# Patient Record
Sex: Female | Born: 1960 | Race: White | Hispanic: Yes | Marital: Married | Smoking: Never smoker
Health system: Southern US, Community
[De-identification: ages and names within clinical notes are randomized; demographics above are authoritative.]

## PROBLEM LIST (undated history)

## (undated) DIAGNOSIS — T7840XA Allergy, unspecified, initial encounter: Secondary | ICD-10-CM

## (undated) DIAGNOSIS — F329 Major depressive disorder, single episode, unspecified: Secondary | ICD-10-CM

## (undated) DIAGNOSIS — Z1371 Encounter for nonprocreative screening for genetic disease carrier status: Secondary | ICD-10-CM

## (undated) DIAGNOSIS — D219 Benign neoplasm of connective and other soft tissue, unspecified: Secondary | ICD-10-CM

## (undated) DIAGNOSIS — N809 Endometriosis, unspecified: Secondary | ICD-10-CM

## (undated) DIAGNOSIS — K222 Esophageal obstruction: Secondary | ICD-10-CM

## (undated) DIAGNOSIS — K449 Diaphragmatic hernia without obstruction or gangrene: Secondary | ICD-10-CM

## (undated) DIAGNOSIS — D649 Anemia, unspecified: Secondary | ICD-10-CM

## (undated) DIAGNOSIS — M810 Age-related osteoporosis without current pathological fracture: Secondary | ICD-10-CM

## (undated) DIAGNOSIS — N3010 Interstitial cystitis (chronic) without hematuria: Secondary | ICD-10-CM

## (undated) DIAGNOSIS — J45909 Unspecified asthma, uncomplicated: Secondary | ICD-10-CM

## (undated) DIAGNOSIS — K209 Esophagitis, unspecified without bleeding: Secondary | ICD-10-CM

## (undated) DIAGNOSIS — F32A Depression, unspecified: Secondary | ICD-10-CM

## (undated) DIAGNOSIS — Z8719 Personal history of other diseases of the digestive system: Secondary | ICD-10-CM

## (undated) DIAGNOSIS — K219 Gastro-esophageal reflux disease without esophagitis: Secondary | ICD-10-CM

## (undated) DIAGNOSIS — F419 Anxiety disorder, unspecified: Secondary | ICD-10-CM

## (undated) HISTORY — DX: Diaphragmatic hernia without obstruction or gangrene: K44.9

## (undated) HISTORY — DX: Gastro-esophageal reflux disease without esophagitis: K21.9

## (undated) HISTORY — DX: Unspecified asthma, uncomplicated: J45.909

## (undated) HISTORY — DX: Benign neoplasm of connective and other soft tissue, unspecified: D21.9

## (undated) HISTORY — DX: Encounter for nonprocreative screening for genetic disease carrier status: Z13.71

## (undated) HISTORY — DX: Esophagitis, unspecified without bleeding: K20.90

## (undated) HISTORY — DX: Age-related osteoporosis without current pathological fracture: M81.0

## (undated) HISTORY — DX: Esophageal obstruction: K22.2

## (undated) HISTORY — DX: Esophagitis, unspecified: K20.9

## (undated) HISTORY — DX: Personal history of other diseases of the digestive system: Z87.19

## (undated) HISTORY — DX: Anemia, unspecified: D64.9

## (undated) HISTORY — DX: Depression, unspecified: F32.A

## (undated) HISTORY — DX: Anxiety disorder, unspecified: F41.9

## (undated) HISTORY — DX: Allergy, unspecified, initial encounter: T78.40XA

## (undated) HISTORY — DX: Interstitial cystitis (chronic) without hematuria: N30.10

## (undated) HISTORY — PX: CHOLECYSTECTOMY: SHX55

## (undated) HISTORY — DX: Major depressive disorder, single episode, unspecified: F32.9

## (undated) HISTORY — DX: Endometriosis, unspecified: N80.9

---

## 1998-10-12 ENCOUNTER — Emergency Department (HOSPITAL_COMMUNITY): Admission: EM | Admit: 1998-10-12 | Discharge: 1998-10-12 | Payer: Self-pay | Admitting: Emergency Medicine

## 1999-03-30 ENCOUNTER — Other Ambulatory Visit: Admission: RE | Admit: 1999-03-30 | Discharge: 1999-03-30 | Payer: Self-pay | Admitting: Obstetrics and Gynecology

## 2000-04-18 ENCOUNTER — Other Ambulatory Visit: Admission: RE | Admit: 2000-04-18 | Discharge: 2000-04-18 | Payer: Self-pay | Admitting: Gynecology

## 2001-04-19 ENCOUNTER — Other Ambulatory Visit: Admission: RE | Admit: 2001-04-19 | Discharge: 2001-04-19 | Payer: Self-pay | Admitting: Gynecology

## 2001-11-26 HISTORY — PX: TMJ ARTHROSCOPY: SHX1067

## 2002-01-07 ENCOUNTER — Ambulatory Visit (HOSPITAL_COMMUNITY): Admission: RE | Admit: 2002-01-07 | Discharge: 2002-01-07 | Payer: Self-pay | Admitting: Unknown Physician Specialty

## 2002-01-07 ENCOUNTER — Encounter: Payer: Self-pay | Admitting: Unknown Physician Specialty

## 2002-04-19 ENCOUNTER — Other Ambulatory Visit: Admission: RE | Admit: 2002-04-19 | Discharge: 2002-04-19 | Payer: Self-pay | Admitting: Gynecology

## 2003-06-12 ENCOUNTER — Other Ambulatory Visit: Admission: RE | Admit: 2003-06-12 | Discharge: 2003-06-12 | Payer: Self-pay | Admitting: Gynecology

## 2003-12-21 HISTORY — PX: PELVIC LAPAROSCOPY: SHX162

## 2003-12-21 HISTORY — PX: HYSTEROSCOPY: SHX211

## 2003-12-22 ENCOUNTER — Ambulatory Visit (HOSPITAL_COMMUNITY): Admission: RE | Admit: 2003-12-22 | Discharge: 2003-12-22 | Payer: Self-pay | Admitting: Gynecology

## 2003-12-22 ENCOUNTER — Ambulatory Visit (HOSPITAL_BASED_OUTPATIENT_CLINIC_OR_DEPARTMENT_OTHER): Admission: RE | Admit: 2003-12-22 | Discharge: 2003-12-22 | Payer: Self-pay | Admitting: Gynecology

## 2003-12-23 ENCOUNTER — Encounter (INDEPENDENT_AMBULATORY_CARE_PROVIDER_SITE_OTHER): Payer: Self-pay | Admitting: Specialist

## 2004-06-17 ENCOUNTER — Other Ambulatory Visit: Admission: RE | Admit: 2004-06-17 | Discharge: 2004-06-17 | Payer: Self-pay | Admitting: Gynecology

## 2004-07-22 HISTORY — PX: VAGINAL HYSTERECTOMY: SUR661

## 2004-08-05 ENCOUNTER — Observation Stay (HOSPITAL_COMMUNITY): Admission: RE | Admit: 2004-08-05 | Discharge: 2004-08-06 | Payer: Self-pay | Admitting: Gynecology

## 2005-06-21 ENCOUNTER — Other Ambulatory Visit: Admission: RE | Admit: 2005-06-21 | Discharge: 2005-06-21 | Payer: Self-pay | Admitting: Gynecology

## 2006-06-28 ENCOUNTER — Other Ambulatory Visit: Admission: RE | Admit: 2006-06-28 | Discharge: 2006-06-28 | Payer: Self-pay | Admitting: Gynecology

## 2006-08-24 ENCOUNTER — Ambulatory Visit: Payer: Self-pay | Admitting: Internal Medicine

## 2007-07-02 ENCOUNTER — Other Ambulatory Visit: Admission: RE | Admit: 2007-07-02 | Discharge: 2007-07-02 | Payer: Self-pay | Admitting: Gynecology

## 2007-10-29 ENCOUNTER — Encounter: Payer: Self-pay | Admitting: Internal Medicine

## 2007-10-30 ENCOUNTER — Encounter: Payer: Self-pay | Admitting: Internal Medicine

## 2008-01-18 DIAGNOSIS — F330 Major depressive disorder, recurrent, mild: Secondary | ICD-10-CM | POA: Insufficient documentation

## 2008-01-18 DIAGNOSIS — J310 Chronic rhinitis: Secondary | ICD-10-CM

## 2008-01-18 DIAGNOSIS — F339 Major depressive disorder, recurrent, unspecified: Secondary | ICD-10-CM | POA: Insufficient documentation

## 2008-01-18 DIAGNOSIS — F325 Major depressive disorder, single episode, in full remission: Secondary | ICD-10-CM | POA: Insufficient documentation

## 2008-01-21 ENCOUNTER — Ambulatory Visit: Payer: Self-pay | Admitting: Internal Medicine

## 2008-01-21 DIAGNOSIS — T887XXA Unspecified adverse effect of drug or medicament, initial encounter: Secondary | ICD-10-CM | POA: Insufficient documentation

## 2008-02-08 ENCOUNTER — Ambulatory Visit: Payer: Self-pay | Admitting: Internal Medicine

## 2008-04-25 ENCOUNTER — Encounter: Payer: Self-pay | Admitting: Internal Medicine

## 2008-05-19 ENCOUNTER — Encounter: Payer: Self-pay | Admitting: Internal Medicine

## 2008-07-03 ENCOUNTER — Encounter: Payer: Self-pay | Admitting: Gynecology

## 2008-07-03 ENCOUNTER — Other Ambulatory Visit: Admission: RE | Admit: 2008-07-03 | Discharge: 2008-07-03 | Payer: Self-pay | Admitting: Gynecology

## 2008-07-03 ENCOUNTER — Ambulatory Visit: Payer: Self-pay | Admitting: Gynecology

## 2008-08-01 ENCOUNTER — Ambulatory Visit: Payer: Self-pay | Admitting: Internal Medicine

## 2008-08-01 DIAGNOSIS — K589 Irritable bowel syndrome without diarrhea: Secondary | ICD-10-CM | POA: Insufficient documentation

## 2008-10-24 ENCOUNTER — Telehealth: Payer: Self-pay | Admitting: Internal Medicine

## 2008-10-24 ENCOUNTER — Emergency Department (HOSPITAL_COMMUNITY): Admission: EM | Admit: 2008-10-24 | Discharge: 2008-10-24 | Payer: Self-pay | Admitting: Emergency Medicine

## 2008-10-27 ENCOUNTER — Ambulatory Visit: Payer: Self-pay | Admitting: Internal Medicine

## 2008-10-27 DIAGNOSIS — R079 Chest pain, unspecified: Secondary | ICD-10-CM | POA: Insufficient documentation

## 2008-10-27 DIAGNOSIS — R5381 Other malaise: Secondary | ICD-10-CM | POA: Insufficient documentation

## 2008-10-27 DIAGNOSIS — R5383 Other fatigue: Secondary | ICD-10-CM

## 2008-10-28 LAB — CONVERTED CEMR LAB
Basophils Absolute: 0 10*3/uL (ref 0.0–0.1)
Basophils Relative: 0.9 % (ref 0.0–3.0)
Eosinophils Absolute: 0.4 10*3/uL (ref 0.0–0.7)
Eosinophils Relative: 10.2 % — ABNORMAL HIGH (ref 0.0–5.0)
HCT: 31.2 % — ABNORMAL LOW (ref 36.0–46.0)
Hemoglobin: 10.8 g/dL — ABNORMAL LOW (ref 12.0–15.0)
Lymphocytes Relative: 21.8 % (ref 12.0–46.0)
MCHC: 34.7 g/dL (ref 30.0–36.0)
MCV: 85.5 fL (ref 78.0–100.0)
Monocytes Absolute: 0.4 10*3/uL (ref 0.1–1.0)
Monocytes Relative: 8.9 % (ref 3.0–12.0)
Neutro Abs: 2.5 10*3/uL (ref 1.4–7.7)
Neutrophils Relative %: 58.2 % (ref 43.0–77.0)
Platelets: 190 10*3/uL (ref 150–400)
RBC: 3.65 M/uL — ABNORMAL LOW (ref 3.87–5.11)
RDW: 11.4 % — ABNORMAL LOW (ref 11.5–14.6)
WBC: 4.2 10*3/uL — ABNORMAL LOW (ref 4.5–10.5)

## 2009-07-06 ENCOUNTER — Other Ambulatory Visit: Admission: RE | Admit: 2009-07-06 | Discharge: 2009-07-06 | Payer: Self-pay | Admitting: Gynecology

## 2009-07-06 ENCOUNTER — Encounter: Payer: Self-pay | Admitting: Gynecology

## 2009-07-06 ENCOUNTER — Ambulatory Visit: Payer: Self-pay | Admitting: Gynecology

## 2009-10-21 ENCOUNTER — Ambulatory Visit: Payer: Self-pay | Admitting: Internal Medicine

## 2010-06-16 ENCOUNTER — Ambulatory Visit: Payer: Self-pay | Admitting: Gynecology

## 2010-06-17 ENCOUNTER — Ambulatory Visit: Payer: Self-pay | Admitting: Gynecology

## 2010-07-07 ENCOUNTER — Ambulatory Visit: Payer: Self-pay | Admitting: Gynecology

## 2010-07-07 ENCOUNTER — Other Ambulatory Visit: Admission: RE | Admit: 2010-07-07 | Discharge: 2010-07-07 | Payer: Self-pay | Admitting: Gynecology

## 2010-08-09 ENCOUNTER — Ambulatory Visit: Payer: Self-pay | Admitting: Gynecology

## 2010-09-23 NOTE — Assessment & Plan Note (Signed)
Summary: Primary svc/ acute rhinitis flare on benadryl/ conjunctivitis   Primary Provider/Referring Provider:  Sherene Sires  CC:  Acute visit.  Pt c/o red itchy eyes x 2 days.  She states that she woke up several times in the night with her eyes matted shut.  .  History of Present Illness: 38 yowf  never smoker with a history of chronic rhinitis and depression with various musculoskeletal complaints that typically responded in the past too Vioxx or other nonsteroidals.  Seen 01/21/08 with  right elbow pain 6 weeks ago while moving furniture.  She was seen in urgent care and tried on Mobic which improved the pain in her elbow only a little bit and then she developed an itchy rash over both arms.  She describes the pain as radiating from the elbow down to the ulnar distribution of the hand with mild numbness no loss of strength and denies any neck pain or injury. Given prednisone taper last visit. Elbow feels much better. Rash gradually resolved.   October 27, 2008 ov after giving blood at high school 3/5  about 930 am 15 min later while sitting at cracker and juice table she began feeling nauseated and urge for bm, stood up then fell and hit floor in the hallway and next remembered looking up at the ceiling > to er at [facility]  left at 730 pm with dx of head lac and  vasovagal syncope with neg head and neck ct,  everything's better at ov except puffiness under eyes.  No am ha or nausea viz or other neuro co's.  October 21, 2009  acute visit.  Pt c/o red itchy eyes x 2 days.  She states that she woke up several times in the night with her eyes matted shut.  self rx with benadryl helps sleep. can't take sudaphed. no purulent nasal secretions.  Pt denies any significant sore throat, dysphagia, itching, sneezing,   fever, chills, sweats, unintended wt loss, pleuritic or exertional cp, hempoptysis, change in activity tolerance  orthopnea pnd or leg swelling. No viz problems or sign ha.   Current Medications (verified): 1)   Wellbutrin Sr 150 Mg  Tb12 (Bupropion Hcl) .... Take 1 Tablet By Mouth Two Times A Day 2)  Nasonex 50 Mcg/act Susp (Mometasone Furoate) .Marland Kitchen.. 1 Spray Each Nostril Every Am  Allergies (verified): No Known Drug Allergies  Past History:  Past Medical History: RHINITIS, CHRONIC (ICD-472.0) DEPRESSION (ICD-311)    Vital Signs:  Patient profile:   50 year old female Weight:      137 pounds BMI:     25.15 O2 Sat:      99 % on Room air Temp:     97.7 degrees F oral Pulse rate:   72 / minute BP sitting:   118 / 60  (left arm)  Vitals Entered By: Vernie Murders (October 21, 2009 9:55 AM)  O2 Flow:  Room air  Physical Exam  Additional Exam:  in general she is a somber but not overtly depressed ambulatory white female nad  wt 134 August 01, 2008 > 137  09/29/08 > 137 October 21, 2009  HEENT: `nl dentition, turbinates, and orophanx. Nl external ear canals without cough reflex, minimal puffiness under eyes, minimal conjuctival erythema.   Neck without JVD/Nodes/TM/ FROM Lungs clear to A and P bilaterally without cough on insp or exp maneuvers RRR no s3 or murmur or increase in P2 Abd soft and benign with nl excursion in the supine position.  No bruits or organomegaly Ext warm without calf tenderness, cyanosis clubbing or edema     Impression & Recommendations:  Problem # 1:  RHINITIS, CHRONIC (ICD-472.0)  acute flare with ? conjunctivitis.   Each maintenance medication was reviewed in detail including most importantly the difference between maintenance and as needed and under what circumstances the prns are to be used. See instructions for specific recommendations - should avoid benadryl in this setting.  Orders: Est. Patient Level IV (45409)  Medications Added to Medication List This Visit: 1)  Nasonex 50 Mcg/act Susp (Mometasone furoate) .Marland Kitchen.. 1 spray each nostril every am 2)  Prednisone 10 Mg Tabs (Prednisone) .... 4 each am x 2days, 2x2days, 1x2days and stop 3)  Cortisporin  3.5-10000-1 Soln (Neomycin-polymyxin-hc) .... Optic solution 2 gtts two times a day both eyes  Patient Instructions: 1)  I emphasized that nasal steroids (Nasacort)  have no immediate benefit in terms of improving symptoms.  To help them reached the target tissue, the patient should use Afrin two puffs every 12 hours applied one min before using the nasal steroids.  Afrin should be stopped after no more than 5 days.  If the symptoms worsen, Afrin can be restarted after 5 days off of therapy to prevent rebound congestion from overuse of Afrin.  I also emphasized that in no way are nasal steroids a concern in terms of "addiction". 2)  corticosporin optic solution per bottle 3)  change benadryl to allergra or clariton 4)  Prednisone 4 each am x 2days, 2x2days, 1x2days and stop  5)  call if not better in 48 hours 6)    Prescriptions: CORTISPORIN 3.5-10000-1 SOLN (NEOMYCIN-POLYMYXIN-HC) Optic solution 2 gtts two times a day both eyes  #1 bottle x 0   Entered and Authorized by:   Nyoka Cowden MD   Signed by:   Nyoka Cowden MD on 10/21/2009   Method used:   Electronically to        [facility]  [address] [id number]* (retail)       [address]       [place], [place]  [address]       Ph: [phone] or [phone]       Fax: [phone]   RxID:   [id number] PREDNISONE 10 MG  TABS (PREDNISONE) 4 each am x 2days, 2x2days, 1x2days and stop  #14 x 0   Entered and Authorized by:   Nyoka Cowden MD   Signed by:   Nyoka Cowden MD on 10/21/2009   Method used:   Electronically to        [facility]  [address] [id number]* (retail)       [address]       [place], [place]  [address]       Ph: [phone] or [phone]       Fax: [phone]   RxID:   [id number] NASONEX 50 MCG/ACT SUSP (MOMETASONE FUROATE) 1 spray each nostril every am  #1 x 11   Entered and Authorized by:   Nyoka Cowden MD   Signed by:   Nyoka Cowden MD on 10/21/2009   Method  used:   Electronically to        [facility]  [address] [id number]* (retail)       [address]       [facility]  [place], [place]  [address]       Ph: [phone] or [phone]       Fax: [phone]   RxID:   [id number]

## 2010-12-02 LAB — URINALYSIS, ROUTINE W REFLEX MICROSCOPIC
Nitrite: NEGATIVE
Protein, ur: NEGATIVE mg/dL
Specific Gravity, Urine: 1.014 (ref 1.005–1.030)
Urobilinogen, UA: 0.2 mg/dL (ref 0.0–1.0)

## 2010-12-02 LAB — COMPREHENSIVE METABOLIC PANEL
ALT: 11 U/L (ref 0–35)
AST: 21 U/L (ref 0–37)
Albumin: 3.7 g/dL (ref 3.5–5.2)
Alkaline Phosphatase: 87 U/L (ref 39–117)
Chloride: 108 mEq/L (ref 96–112)
Creatinine, Ser: 0.77 mg/dL (ref 0.4–1.2)
GFR calc Af Amer: >60 mL/min (ref >60)
Potassium: 4.1 mEq/L (ref 3.5–5.1)
Sodium: 137 mEq/L (ref 135–145)
Total Bilirubin: 0.8 mg/dL (ref 0.3–1.2)

## 2010-12-02 LAB — DIFFERENTIAL
Basophils Absolute: 0 10*3/uL (ref 0.0–0.1)
Eosinophils Absolute: 0.1 10*3/uL (ref 0.0–0.7)
Eosinophils Relative: 2 % (ref 0–5)
Lymphocytes Relative: 8 % — ABNORMAL LOW (ref 12–46)
Monocytes Absolute: 0.4 10*3/uL (ref 0.1–1.0)

## 2010-12-02 LAB — CBC
Platelets: 208 10*3/uL (ref 150–400)
RBC: 4.19 MIL/uL (ref 3.87–5.11)
WBC: 7.9 10*3/uL (ref 4.0–10.5)

## 2011-01-04 NOTE — Consult Note (Signed)
Yolanda [name], Yolanda [name]                 ACCOUNT NO.:  [id number]   MEDICAL RECORD NO.:  [id number]          PATIENT TYPE:  EMS   LOCATION:  ED                           FACILITY:  [facility]   PHYSICIAN:  Newman Pies, MD            DATE OF BIRTH:  [birth year]   DATE OF CONSULTATION:  10/24/2008  DATE OF DISCHARGE:                                 CONSULTATION   CHIEF COMPLAINT:  Complex forehead laceration.   HISTORY OF PRESENT ILLNESS:  The patient is a 50 year old white female  who presents to the [facility] Emergency Room on October 24, 2008, after  she fell and hit her forehead on the ground.  The accident occurred  after a brief syncope episode.  According to the husband, the event  occurred after the patient donated blood at a high school blood drive.  The fall resulted in a stellate forehead laceration.  The patient  reports a brief loss of consciousness.  She denies any visual change,  epistaxis, or memory loss.  The patient has no previous history of  syncope.   PAST MEDICAL HISTORY:  Depression.   PAST SURGICAL HISTORY:  TMJ surgery.   HOME MEDICATION:  Wellbutrin.   ALLERGIES:  No known drug allergies.   SOCIAL HISTORY:  Nonsmoker, nondrinker, no drug abuse.   IMAGING STUDY:  The CT of the head is negative for any intracranial  injury.  Only a soft tissue injury over the forehead is noted.  No  facial fracture is noted.   PHYSICAL EXAMINATION:  VITAL SIGNS:  Temperature 98.4, blood pressure  111/73, pulse 81, respiration 18, and 100% oxygen saturation on room  air.  GENERAL:  The patient is a well-nourished, well-developed 50 year old  white female in no acute distress.  She is alert and oriented x3.  HEENT:  Her pupils are equal, round and reactive to light.  Extraocular  motion is intact.  Examination of the face shows a stellate forehead  laceration.  The laceration is full thickness down to the periosteum.  The laceration measures approximately 6 cm in size.  Examination of  the  ears shows normal ear canals, tympanic membranes, and middle ear spaces.  Nasal examination shows normal mucosa, septum, and turbinates.  No  epistaxis is noted.  Oral cavity examination shows normal lips, gums,  tongue, oral cavity, oropharyngeal mucosa.  NECK:  Palpation of the neck reveals no lymphadenopathy or mass.  Her  trachea is midline.  The thyroid is not significantly enlarged.  NEUROLOGIC:  Cranial nerves II-XII are all grossly intact.   PROCEDURE:  Laceration repair of the complex forehead laceration.   ANESTHESIA:  Local anesthesia with 1% lidocaine with 1:100,000  epinephrine.   DESCRIPTION OF PROCEDURE:  The patient is placed supine on the hospital  bed.  The forehead laceration site is cleaned and copiously irrigated.  It is prepped and draped in a standard fashion with iodine.  The 1%  lidocaine with 1:100,000 epinephrine is infiltrated locally for local  anesthesia.  After adequate anesthesia is achieved, the wound site  is  carefully explored.  All the devitalized tissues are removed.  The  stellate laceration is subsequently closed in layers with 4-0 Vicryl and  5-0 Prolene sutures.  The patient tolerated the procedure well without  difficulty.   IMPRESSION:  Complex forehead laceration measuring 6 cm in size.   RECOMMENDATIONS:  1. Forehead laceration repair in the emergency room.  2. Vicodin 1-2 tablets p.o. q.4-6 h. p.r.n. pain.  3. Flexeril 10 mg p.o. at bedtime.  4. Augmentin 875 mg p.o. b.i.d. for 5 days.  5. The patient will follow up in my office in 1 week for suture      removal.      Newman Pies, MD  Electronically Signed     ST/MEDQ  D:  10/24/2008  T:  10/25/2008  Job:  [id number]

## 2011-01-07 NOTE — Assessment & Plan Note (Signed)
[facility]                             PULMONARY OFFICE NOTE   Yolanda, [name]                          MRN:          [id number]  DATE:08/24/2006                            DOB:          April 01, 1961    HISTORY OF PRESENT ILLNESS:  The patient is a 50 year old white female  patient of Dr. Thurston Hole who has a known history of depression and chronic  rhinitis, presents for an acute office visit. The patient complained of  a 2-day history of bilateral lower back pain. The patient has not been  seen in greater than 4 years here at the office, reporting the she has  been doing well up until this last week. The patient complains that  yesterday she was bending over to pick up a laundry basket and felt a  pulling pain in her lower back. Since then she has been having  significant difficultly bending, trying to sit up from a standing  position, and significant pain along the bilateral lower back. She  denies any known previous injuries, surgery, or back problems. She did  have a fall approximately 2 weeks ago in which she tripped over a  curbing outside and fell onto her knees but did not have any back pain  at that time. The patient denies any chest pain, shortness of breath,  presyncopal, syncopal episodes, palpitations, urinary symptoms,  incontinence or radicular pain. The patient states her gait has been  normal and no extremity weakness either. She has used some Motrin 800 mg  over-the-counter today and that seems to have helped some.   PAST MEDICAL HISTORY:  Reviewed.   CURRENT MEDICATIONS:  Reviewed.   PHYSICAL EXAMINATION:  The patient is a pleasant female, in no acute  distress. She is afebrile. Stable vital signs. O2 saturation is 99% on  room air.  HEENT: Unremarkable.  NECK: Supple, without adenopathy  LUNG SOUNDS: Clear.  CARDIAC: Regular rate.  ABDOMEN: Soft without hepatosplenomegaly, no guarding, or rebound.  No  abdominal bruits or  masses appreciated.  Negative CVA tenderness.  EXTREMITIES: Warm without any calf tenderness, cyanosis, clubbing or  edema.  Equal strength of the upper and lower extremities. Moves all  extremities well. Negative straight leg raises. DTRs are 2+ throughout.  Great toe dorsal flexion is normal. Normal and steady gait. The patient  does have reproducible symptoms with tying to stand up from a sitting  position and from changing from a lying to sitting position. The patient  does have significant tenderness along the lumbosacral region  bilaterally left greater than right.   IMPRESSION AND PLAN:  Lumbosacral strain. Patient is to benign Motrin  800 mg t.i.d. x7 days. The patient is to take this with food. Use  Skelaxin 800 mg up to 3 times a day as needed.  Vicodin # 20 on to 2 every 4 to 6 hours as needed for severe pain.  The  patient is aware to alternate ice and heat. The patient is to return  here in 2 weeks or sooner if needed.      Yolanda  Parrett, NP  Electronically Signed      Yolanda [name]. Yolanda Sires, MD, [facility]  Electronically Signed   TP/MedQ  DD: 08/25/2006  DT: 08/25/2006  Job #: [id number]

## 2011-01-07 NOTE — H&P (Signed)
Yolanda [name], TEAGUE                             ACCOUNT NO.:  [id number]   MEDICAL RECORD NO.:  [id number]                   PATIENT TYPE:  AMB   LOCATION:  [facility]                                 FACILITY:  [facility]   PHYSICIAN:  Timothy P. Fontaine, M.D.           DATE OF BIRTH:  1960/12/08   DATE OF ADMISSION:  12/22/2003  DATE OF DISCHARGE:                                HISTORY & PHYSICAL   CHIEF COMPLAINT:  Persistent left-sided pain, menorrhagia, dysmenorrhea.   HISTORY OF PRESENT ILLNESS:  Patient is a 50 year old G2, P2 female,  vasectomy birth control, who presents with increasing dysmenorrhea as well  as menorrhagia.  She also has left-sided pelvic pain that comes and goes,  which seems to be increasing in intensity.  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  Right  and left ovaries were visualized and normal.  She subsequently had a  sonohistogram, which shows involvement of the endometrial cavity by the  myoma, approximately 25% or so protruding into the cavity posteriorly.  Patient is admitted at this time for laser video laparoscopy for her pelvic  pain and attempted hysteroscopic resection of the myoma due to her  dysmenorrhea and menorrhagia.   PAST MEDICAL HISTORY:  Significant for depression.   PAST SURGICAL HISTORY:  Includes TMJ surgery x2.   CURRENT MEDICATIONS:  Effexor day and Klonopin p.r.n., Naproxen p.r.n.   ALLERGIES:  No medications.   REVIEW OF SYSTEMS:  Noncontributory.   FAMILY HISTORY:  Noncontributory.   SOCIAL HISTORY:  Noncontributory.   PHYSICAL EXAMINATION:  VITAL SIGNS:  Afebrile, vital signs are stable.  HEENT:  Normal.  LUNGS:  Clear.  CARDIAC:  Regular rate without rubs, murmurs or gallops.  ABDOMEN:  Benign.  PELVIC:  External, BUS, vagina normal.  Cervix normal.  Uterus anteverted,  normal size,  nontender.  Adnexa without masses or tenderness.   ASSESSMENT:  A 50 year old G2, P2 female, vasectomy birth control, with  increasing left-sided pain, menorrhagia and dysmenorrhea for laparoscopy,  hysteroscopy, and attempted resection of myoma.  The risks, benefits,  indications, and alternatives for the procedure were reviewed with the  patient.  The expected intraoperative and postoperative courses were  discussed with her.  I reviewed with her there are no guarantees as far as  pain relief, that her pain may persist, worsen or change following the  procedure, and she understands and accepts this.  I also reviewed the myoma  and only a relatively small portion of the myoma is protruding into the  cavity posteriorly that we will attempt to initially resect this portion, if  more of the myoma bulges into the cavity, we will resect as much as  possible.  She understands that I may be able to remove all of it but I will  not  be able to chase the myoma into the surrounding myometrium and that I  can only resect it up to the level of the surrounding endometrium and that I  may leave a substantial portion of the myoma and she understands and accepts  this.  I reviewed the risks of hysteroscopic resection and what is involved  with this to include the instrumentation and the use of the resectoscope.  The risks of bleeding, transfusion, infection, uterine perforation, damage  to internal organs necessitating major reparative surgeries as outlined  below in the laparoscopic discussion was all discussed, understood and  accepted.  The risks of sorbitol absorption and the inherent risks in  electrolyte abnormalities was also discussed with her.  I reviewed the  laparoscopic portion to include insufflation, instrumentation, trocar  placement, sharp blunt dissection, electrocautery, and the use of laser.  The risks of infection both internal requiring prolonged antibiotics as well  as incisional  requiring opening and draining of incisions was all reviewed,  understood and accepted.  The risks of major hemorrhage necessitating  transfusion and the risks of transfusion was discussed with her to include  transfusion reaction, hepatitis, HIV, mad cow disease, and other unknown  entities.  The risks of inadvertent injury to internal organs including  bowel, bladder, ureters, vessels, and nerves necessitating major exploratory  reparative surgeries and future reparative surgeries including ostomy  formation was all reviewed, understood and accepted.  Again, no guarantees  for pain relief were made.  She understands there may be no pathology found  or we may find pathology such as endometriosis or adhesive disease and I  will try to eradicate as best I can disease as encountered, and the patient  understands and accepts this.  The patient's questions are answered to her  satisfaction and she is ready to proceed with surgery.                                               Timothy P. Audie Box, M.D.    TPF/MEDQ  D:  12/19/2003  T:  12/19/2003  Job:  [id number]

## 2011-01-07 NOTE — H&P (Signed)
NAMEGRACLYN, [name]                 ACCOUNT NO.:  [id number]   MEDICAL RECORD NO.:  [id number]          PATIENT TYPE:  OBV   LOCATION:  NA                            FACILITY:  [facility]   PHYSICIAN:  Timothy P. Fontaine, M.D.DATE OF BIRTH:  Nov 25, 1960   DATE OF ADMISSION:  DATE OF DISCHARGE:                                HISTORY & PHYSICAL   DATE OF ADMISSION:  For surgery August 05, 2004 at 7:30 a.m. at [facility].   CHIEF COMPLAINT:  Menorrhagia, dysmenorrhea.   HISTORY OF PRESENT ILLNESS:  A 50 year old G2 P2 female, vasectomy birth  control, with increasing menorrhagia, dysmenorrhea.  The patient is status  post laparoscopy, hysteroscopy, with submucous leiomyomata resection May  2005 and continues to have heavy menses and is ready to proceed with  hysterectomy.   PAST MEDICAL HISTORY:  Depression.   PAST SURGICAL HISTORY:  Includes TMJ surgery x2; laparoscopy, hysteroscopy  in 2005.   MEDICATIONS:  1.  Effexor daily.  2.  Klonopin p.r.n.  3.  Naproxen p.r.n.   ALLERGIES:  No medications.   REVIEW OF SYSTEMS:  Noncontributory.   FAMILY HISTORY:  Noncontributory.   SOCIAL HISTORY:  Noncontributory.   ADMISSION PHYSICAL EXAMINATION:  VITAL SIGNS:  Afebrile, vital signs are  stable.  HEENT:  Normal.  LUNGS:  Clear.  CARDIAC:  Regular rate without rubs, murmurs, or gallops.  ABDOMEN:  Benign.  PELVIC:  External, BUN, vagina normal.  Cervix normal.  Uterus grossly  normal in size, mildly irregular.  Adnexa without masses or tenderness.   ASSESSMENT:  A 50 year old G2 P2 female, vasectomy birth control, with  increasing menorrhagia and dysmenorrhea.  She is status post laparoscopy  with findings of pelvic congestion and subserosal leiomyomata.  She is also  status post resection of submucous myoma without benefit from her  menorrhagia.  The risks, benefits, indications, and alternatives were  reviewed with her and she wants to proceed with hysterectomy.  I  reviewed  with her and her husband the expected intraoperative/postoperative courses;  the risks, benefits, indications, and alternatives.  The patient understands  that we will attempt a vaginal approach but if at any time during the  surgery it becomes unwise to proceed vaginally or if complications arise  that we may switch this to an abdominal approach and perform an abdominal  hysterectomy.  The risks of infection - both internal requiring prolonged  antibiotics as well as abscess formation requiring reoperation and drainage  of abscess was all discussed, understood, and accepted.  If indeed abdominal  incisions are made she understands the risks of incisional complications  requiring opening and draining of incisions and closure by secondary  intention.  The risk of bleeding was reviewed and the risk of hemorrhage  necessitating transfusion, and the risks of transfusion were discussed to  include transfusion reaction, hepatitis, HIV, mad cow disease, and other  unknown entities.  The risk of inadvertant injury to internal organs  including bowel, bladder, ureters, vessels, and nerves necessitating major  exploratory reparative surgeries and future reparative surgeries including  ostomy formation was  all discussed, understood, and accepted.  Absolute  sterility following the procedure was discussed, understood, and accepted.  Sexuality following hysterectomy was also reviewed and the potential for  orgasmic dysfunction, as well as persistent dyspareunia was discussed,  understood, and accepted.  The patient is having left-side pain although her  ovary was normal on laparoscopic evaluation.  She asked if I would take  close look at this side.  If there is any pathology at all she would prefer  to remove the left ovary.  She does give me permission to remove one or both  ovaries if at the time of surgery significant disease is encountered, but  she does prefer to keep both ovaries if  everything appears normal and she  understands the risks of persistent pain following the procedure.  The  issues of oophorectomy were reviewed and the risks of keeping both ovaries  to include persistent pain or pathology such as cyst formation in the future  requiring further evaluation and reoperation were reviewed, as well as the  risk of ovarian cancer in the future.  She understands and accepts this,  versus the risk of removing both ovaries and the issues of symptomatology  from hypoestrogenism, as well as the risk of HRT was discussed with her.  Again, she prefers to keep both ovaries, accepting these risks, but does  give me permission to remove one or both if disease is encountered.  The  patient's questions are answered to her satisfaction and she is ready to  proceed with surgery.     Timo   TPF/MEDQ  D:  07/28/2004  T:  07/28/2004  Job:  [id number]

## 2011-01-07 NOTE — Op Note (Signed)
NAMECARESSA, SCEARCE                             ACCOUNT NO.:  [id number]   MEDICAL RECORD NO.:  [id number]                   PATIENT TYPE:  AMB   LOCATION:  [facility]                                 FACILITY:  [facility]   PHYSICIAN:  Timothy P. Fontaine, M.D.           DATE OF BIRTH:  04/04/1961   DATE OF PROCEDURE:  DATE OF DISCHARGE:                                 OPERATIVE REPORT   PREOPERATIVE DIAGNOSES:  1. Menorrhagia, dysmenorrhea.  2. Leiomyoma, submucous.  3. Left lower quadrant pain.   POSTOPERATIVE DIAGNOSES:  1. Menorrhagia, dysmenorrhea.  2. Leiomyoma, submucous.  3. Left lower quadrant pain.  4. Pelvic congestion.   OPERATION/PROCEDURE:  1. Laparoscopic submucous myoma resection.  2. Diagnostic laparoscopy.   SURGEON:  Timothy P. Fontaine, M.D.   ANESTHESIA:  General endotracheal anesthesia.   COMPLICATIONS:  None.   SORBITOL DISCREPANCY:  50 mL.   ESTIMATED BLOOD LOSS:  Minimal.   SPECIMENS:  1. Myoma fragments.  2. EMC.   FINDINGS:  1. Hysteroscopic submucous myoma, right mid posterior cavity, resected 90%     with small portion remaining within the myometrium.  Hysteroscopic     otherwise normal noting right and left tubal ostia, fundus, anterior and     posterior uterine surfaces, lower uterine segment, endocervical canal all     visualized.  Laparoscopic anterior cul-de-sac normal.  Posterior cul-de-sac grossly  normal noting significant venous pelvic congestion with bilateral  parauterine vessels engorged.  Uterus overall grossly normal noting small 5  mm pedunculated for serosal myoma, anterior left fundus, larger 1.5-2 cm  subserosal myoma, posterior left mid uterine wall.  Uterus otherwise grossly  normal.  Right and left fallopian tubes normal length, caliber, with  fimbriated ends.  Right and left ovaries grossly normal.  No evidence of  pelvic endometriosis for adhesive disease.  Upper abdominal exam shows  appendix grossly normal, free and  mobile.  Liver smooth, no abnormalities.  Gallbladder grossly normal.  No evidence of upper abdominal disease or  adhesions.   DESCRIPTION OF PROCEDURE:  The patient was taken to the operating room.  Underwent general endotracheal anesthesia.  Was placed in the low dorsal  lithotomy position. Received an abdominal, perineal and vaginal preparation  with Betadine solution.  Bladder emptied with a Foley catheterization and  EUA performed.  The patient was draped in the usual fashion.  The cervix was  then visualized, grasped with a single-tooth tenaculum, gently gradually  dilated to admit the operative hysteroscope.  Hysteroscopy was performed  with findings of a submucous myoma, approximately 50% within the cavity from  the right mid posterior uterine surface.  Using the right angle  resectoscopic loop, the myoma was progressively resected in multiple passes  allowing decompression of the cavity with removal of fragments and more of  the myoma protruded into the cavity.  At the end of the procedure, there was  approximately 10% of  the myoma which remained within the myometrial wall and  it was felt unsafe to further resect this as this would be chasing the myoma  into the myometrium.  A gentle sharp curettage was then performed.  Rehysteroscopy showed an empty cavity, good distention with no myoma  protruding into the cavity.  There was no evidence of perforation throughout  the case.   Attention was then turned to the laparoscopic portion.  A Hulka tenaculum  was placed on the cervix.  The surgeon and assistant regloved.  The vertical  infraumbilical incision was made, the Veress needle placed, its position  verified with water, and approximately 3 L of carbon dioxide gas infused  without difficulty.  The 10 mm laparoscopic trocar was then placed without  difficulty, its position verified visually.  A 5 mm suprapubic port was then  placed under direct visualization after  transillumination for the vessels  without difficulty and examination of the pelvic organs and upper abdominal  exam was carried out with findings noted above.  The suprapubic port was  then removed, the gas allowed to escape, the port reinspected under a low  pressure situation showing adequate hemostasis.  The infraumbilical port was  then backed out under direct visualization showing adequate hemostasis.  No  evidence of hernia formation.  A 0 Vicryl interrupted subcuticular fascial  stitch was placed infraumbilically.  Both skin incisions were injected using  0.25% Marcaine.  Dermabond skin adhesive applied for closure.  The Hulka  tenaculum removed.  The patient was placed in the supine position, awakened  without difficulty and taken to the recovery room in good condition, having  tolerated the procedure well                                               Timothy P. Audie Box, M.D.    TPF/MEDQ  D:  12/22/2003  T:  12/22/2003  Job:  [id number]

## 2011-01-07 NOTE — Discharge Summary (Signed)
NAMELIYAT, FAULKENBERRY                 ACCOUNT NO.:  [id number]   MEDICAL RECORD NO.:  [id number]          PATIENT TYPE:  OBV   LOCATION:  9316                          FACILITY:  [facility]   PHYSICIAN:  Timothy P. Fontaine, M.D.DATE OF BIRTH:  [birth year]   DATE OF ADMISSION:  08/05/2004  DATE OF DISCHARGE:                                 DISCHARGE SUMMARY   DISCHARGE DIAGNOSES:  1.  Menorrhagia.  2.  Dysmenorrhea.  3.  Leiomyomata.   PROCEDURE:  Total vaginal hysterectomy August 05, 2004.  Pathology  pending.   HOSPITAL COURSE:  A 50 year old female with increasing dysmenorrhea and  menorrhagia underwent uncomplicated total vaginal hysterectomy August 05, 2004.  Her postoperative course was uncomplicated and she was discharged the  morning following the surgery ambulating well, tolerating oral diet, with a  postoperative hemoglobin of 10.5.  The patient received precautions,  instructions, and follow-up, will be seen in the office in 2 weeks, and  received a prescription for Tylox #25 one to two p.o. q.4-6h. p.r.n. pain.     Timo   TPF/MEDQ  D:  08/06/2004  T:  08/06/2004  Job:  [id number]

## 2011-01-07 NOTE — Op Note (Signed)
Yolanda [name], Yolanda [name]                 ACCOUNT NO.:  [id number]   MEDICAL RECORD NO.:  [id number]          PATIENT TYPE:  OBV   LOCATION:  9399                          FACILITY:  [facility]   PHYSICIAN:  Timothy P. Fontaine, M.D.DATE OF BIRTH:  12/15/1960   DATE OF PROCEDURE:  08/05/2004  DATE OF DISCHARGE:                                 OPERATIVE REPORT   PREOPERATIVE DIAGNOSES:  1.  Dysmenorrhea.  2.  Menorrhagia.   POSTOPERATIVE DIAGNOSES:  1.  Dysmenorrhea.  2.  Menorrhagia.  3.  Leiomyomata.   PROCEDURE:  Total vaginal hysterectomy.   SURGEON:  Timothy P. Fontaine, M.D.   ASSISTANT:  Rande Brunt. Eda Paschal, M.D.   ANESTHESIA:  General.   ESTIMATED BLOOD LOSS:  100 cc.   COMPLICATIONS:  None.   SPECIMENS:  Uterus.   FINDINGS:  The uterus overall normal in size, with several small subserosal  leiomyomata.  The right and left ovaries, right and left fallopian tubes  grossly normal.   DESCRIPTION OF PROCEDURE:  The patient was taken to the operating room and  underwent general anesthesia.  She was placed in the dorsal lithotomy  position.  She received a vaginal and perineal preparation with Betadine  solution.  The bladder was emptied with in-and-out Foley catheterization.  The patient was draped in the usual fashion.   An EUA was performed.  Subsequently, the cervix was visualized with a  surgical speculum, grasped with a tenaculum, and circumferentially injected  submucosally with a lidocaine/epinephrine mixture.  The cervical mucosa was  then sharply incised circumferentially and the paracervical plain sharply  developed.  The posterior cul-de-sac was then sharply entered without  difficulty, and a long weighted speculum was placed.  The right and left  uterosacral ligaments were identified.  They were clamped, cut, and ligated  using 0 Vicryl suture and tagged for future reference.  The anterior  vesicouterine fold was progressively sharply developed, and subsequently  the  anterior cul-de-sac was sharply entered without difficulty.  The uterus was  then progressively freed from its attachments through clamping, cutting, and  ligating of the parametrial tissues using 0 Vicryl suture.  The uterus was  then delivered through the vagina, and the utero-ovarian pedicles  bilaterally were clamped and cut, removing the specimen.  The pedicles were  then secured using 0 Vicryl suture in a suture ligature followed by a simple  stitch.  The long weighted speculum was then replaced with a shorter  weighted speculum.  The intestines were packed from the posterior cuff with  a tail sponge, and subsequently the posterior vaginal cuff was run from  uterosacral ligament to uterosacral ligament using 0 Vicryl suture in a  running interlocking stitch.  The tail sponge was removed.  Adequate  hemostasis was visualized at all pedicles.  The vagina was then closed  anterior to posterior using 0 Vicryl suture in interrupted figure-of-eight  stitch.  The vagina was copiously irrigated.  Adequate hemostasis was  visualized.  The remaining uterosacral packs were cut.  A Foley catheter was  placed.  Clear yellow free-flowing urine was noted.  The patient was placed in the supine position, awakened without difficulty,  and taken to the recovery room in good condition, having tolerated the  procedure well.     Timo   TPF/MEDQ  D:  08/05/2004  T:  08/05/2004  Job:  [id number]

## 2011-04-26 ENCOUNTER — Other Ambulatory Visit: Payer: Self-pay | Admitting: Internal Medicine

## 2011-07-06 DIAGNOSIS — N3010 Interstitial cystitis (chronic) without hematuria: Secondary | ICD-10-CM | POA: Insufficient documentation

## 2011-07-06 DIAGNOSIS — F32A Depression, unspecified: Secondary | ICD-10-CM | POA: Insufficient documentation

## 2011-07-06 DIAGNOSIS — F329 Major depressive disorder, single episode, unspecified: Secondary | ICD-10-CM | POA: Insufficient documentation

## 2011-07-11 ENCOUNTER — Encounter: Payer: Self-pay | Admitting: Gynecology

## 2011-07-11 ENCOUNTER — Ambulatory Visit (INDEPENDENT_AMBULATORY_CARE_PROVIDER_SITE_OTHER): Payer: BC Managed Care – PPO | Admitting: Gynecology

## 2011-07-11 VITALS — BP 124/68 | Ht 61.5 in | Wt 134.0 lb

## 2011-07-11 DIAGNOSIS — Z01419 Encounter for gynecological examination (general) (routine) without abnormal findings: Secondary | ICD-10-CM

## 2011-07-11 NOTE — Progress Notes (Signed)
Yolanda [name] 04-Sep-1960 [id number]        50 y.o.  for annual exam.  Doing well no complaints status post TVH in the past.  Past medical history,surgical history, medications, allergies, family history and social history were all reviewed and documented in the EPIC chart. ROS:  Was performed and pertinent positives and negatives are included in the history.  Exam: chaperone present Filed Vitals:   07/11/11 1014  BP: 124/68   General appearance  Normal Skin grossly normal Head/Neck normal with no cervical or supraclavicular adenopathy thyroid normal Lungs  clear Cardiac RR, without RMG Abdominal  soft, nontender, without masses, organomegaly or hernia Breasts  examined lying and sitting without masses, retractions, discharge or axillary adenopathy. Pelvic  Ext/BUS/vagina  normal   Adnexa  Without masses or tenderness    Anus and perineum  normal   Rectovaginal  normal sphincter tone without palpated masses or tenderness.    Assessment/Plan:  50 y.o. female for annual exam.   Doing well status post hysterectomy. No menopausal symptoms. 1. Pap smear. I reviewed current Pap smear guidelines status post hysterectomy. She had a normal Pap smear November 2011, no history of abnormals in the past and a number of normal reports in her chart. I discussed stopping altogether versus less frequent screening interval at this point we'll plan on less frequent screening interval to every 3 years. 2. Breast health. SBE monthly reviewed. Due for mammogram January 2013 she knows to follow up for this. 3. Health maintenance. Patient recently had an insurance exam she reports having normal blood work to include blood count, lipid profile, glucose. No blood work was done today. Assuming she continues well from a gynecologic standpoint she will see [place] in a year sooner as needed.     Dara Lords MD, 10:44 AM 07/11/2011

## 2011-09-29 ENCOUNTER — Encounter: Payer: Self-pay | Admitting: Gynecology

## 2011-12-07 ENCOUNTER — Ambulatory Visit (INDEPENDENT_AMBULATORY_CARE_PROVIDER_SITE_OTHER): Payer: BC Managed Care – PPO | Admitting: Family Medicine

## 2011-12-07 DIAGNOSIS — M25539 Pain in unspecified wrist: Secondary | ICD-10-CM

## 2011-12-07 DIAGNOSIS — S66919A Strain of unspecified muscle, fascia and tendon at wrist and hand level, unspecified hand, initial encounter: Secondary | ICD-10-CM

## 2011-12-07 DIAGNOSIS — J301 Allergic rhinitis due to pollen: Secondary | ICD-10-CM

## 2011-12-07 MED ORDER — OLOPATADINE HCL 0.1 % OP SOLN: 1.0000 [drp] | Freq: Two times a day (BID) | OPHTHALMIC | Status: DC

## 2011-12-07 MED ORDER — MOMETASONE FUROATE 50 MCG/ACT NA SUSP: 2.0000 | Freq: Every day | NASAL | Status: DC

## 2011-12-07 NOTE — Progress Notes (Signed)
Patient Name: Yolanda [name] Date of Birth: 1960/09/23 Medical Record Number: [id number] Gender: female Date of Encounter: 12/07/2011  History of Present Illness:  Yolanda [name] is a 51 y.o. very pleasant female patient who presents with the following:  Here today with allergic rhinitis symptoms for the last month or so- worse for a week. Would like a RF of her nasonex.  Eyes are very itchy, watery.  Sneezing, runny nose, itchy throat.  No contact use  Also notes right wrist pain on the ulnar side that occurs sporadically- the pain shoots up her wrist with certain movements.  This has gone on for abut 2 weeks.  She notes possible minimal swelling.  No injury that she can recall.  She does not note any unusual activities.  She does use her right wrist a lot to scoop cookie dough.  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 DEPRESSION  . RHINITIS, CHRONIC  . IBS  . TENDINITIS, ELBOW  . FATIQUE AND MALAISE  . SKIN RASH  . CHEST PAIN  . UNS ADVRS EFF UNS RX MEDICINAL&BIOLOGICAL SBSTNC  . Depression  . IC (interstitial cystitis)   Past Medical History  Diagnosis Date  . Depression   . IC (interstitial cystitis)    Past Surgical History  Procedure Date  . Tmj arthroscopy 11-26-2001  . Hysteroscopy 12/2003    RESECTON OF SUBMUCOUS  MYOMA/DX SCOPE  . Pelvic laparoscopy 12/2003    DX SCOPE/RESECTION OF SUBMUCOUS MYOMA  . Vaginal hysterectomy 12/05   History  Substance Use Topics  . Smoking status: Never Smoker   . Smokeless tobacco: Never Used  . Alcohol Use: 1.2 oz/week    2 Glasses of wine per week     week   Family History  Problem Relation Age of Onset  . Breast cancer Sister     HALF SISTER-DIFFERENT MOTHER  . Breast cancer Brother     HALF SISTER (DIFFERENT MOTHER)  . Diabetes Brother     HALF BROTHER   . Cancer Maternal Grandmother     UTERINE  . Breast cancer Sister     HALF SISTER (DIFFERENT  MOTHER)  . Diabetes Brother     HALF BROTHER  . Cystic fibrosis Other    No Known Allergies  Medication list has been reviewed and updated.  Review of Systems: As per HPI- otherwise negative.   Physical Examination: Filed Vitals:   12/07/11 1229  BP: 121/83  Pulse: 75  Temp: 98.3 F (36.8 C)  TempSrc: Oral  Resp: 16  Height: 5\' 1"  (1.549 m)  Weight: 132 lb 3.2 oz (59.966 kg)    Body mass index is 24.98 kg/(m^2).  GEN: WDWN, NAD, Non-toxic, A & O x 3 HEENT: Atraumatic, Normocephalic. Neck supple. No masses, No LAD.  TM, oropharynx wnl, nasal cavity congested, eyes slightly injected Ears and Nose: No external deformity. CV: RRR, No M/G/R. No JVD. No thrill. No extra heart sounds. PULM: CTA B, no wheezes, crackles, rhonchi. No retractions. No resp. distress. No accessory muscle use EXTR: No c/c/e NEURO Normal gait.  PSYCH: Normally interactive. Conversant. Not depressed or anxious appearing.  Calm demeanor.  Right wrist: currently unable to reproduce pain,  Normal ROM and strength, no swelling or redness   Assessment and Plan: 1. Allergic rhinitis  mometasone (NASONEX) 50 MCG/ACT nasal spray, olopatadine (PATANOL) 0.1 % ophthalmic solution  2. Wrist pain    3. Wrist strain  Refilled medications for allergies as above.  Suspect wrist overuse injury- gave velcro splint to use as needed for protection and rest. If not better in the next week or two let me know

## 2012-05-08 ENCOUNTER — Ambulatory Visit: Payer: BC Managed Care – PPO | Admitting: Gynecology

## 2012-05-08 ENCOUNTER — Other Ambulatory Visit: Payer: Self-pay | Admitting: Family Medicine

## 2012-05-08 ENCOUNTER — Ambulatory Visit: Payer: BC Managed Care – PPO | Admitting: Family Medicine

## 2012-05-08 ENCOUNTER — Ambulatory Visit
Admission: RE | Admit: 2012-05-08 | Discharge: 2012-05-08 | Disposition: A | Discharge status: Home / Self Care | Payer: BC Managed Care – PPO | Source: Ambulatory Visit | Attending: Family Medicine | Admitting: Family Medicine

## 2012-05-08 VITALS — BP 110/80 | HR 112 | Temp 98.6°F | Resp 18 | Ht 61.5 in | Wt 134.0 lb

## 2012-05-08 DIAGNOSIS — R1013 Epigastric pain: Secondary | ICD-10-CM

## 2012-05-08 DIAGNOSIS — R197 Diarrhea, unspecified: Secondary | ICD-10-CM

## 2012-05-08 DIAGNOSIS — R1032 Left lower quadrant pain: Secondary | ICD-10-CM

## 2012-05-08 DIAGNOSIS — R110 Nausea: Secondary | ICD-10-CM

## 2012-05-08 DIAGNOSIS — R1012 Left upper quadrant pain: Secondary | ICD-10-CM

## 2012-05-08 LAB — POCT CBC
HCT, POC: 41 % (ref 37.7–47.9)
Hemoglobin: 12.9 g/dL (ref 12.2–16.2)
Lymph, poc: 1 (ref 0.6–3.4)
MCH, POC: 27.6 pg (ref 27–31.2)
MCHC: 31.5 g/dL — AB (ref 31.8–35.4)
MPV: 7.4 fL (ref 0–99.8)
POC LYMPH PERCENT: 20.1 %L (ref 10–50)
POC MID %: 10.1 %M (ref 0–12)
WBC: 5.2 10*3/uL (ref 4.6–10.2)

## 2012-05-08 LAB — POCT UA - MICROSCOPIC ONLY
Crystals, Ur, HPF, POC: NEGATIVE
RBC, urine, microscopic: NEGATIVE

## 2012-05-08 LAB — COMPREHENSIVE METABOLIC PANEL WITH GFR
ALT: 14 U/L (ref 0–35)
AST: 13 U/L (ref 0–37)
Albumin: 4.7 g/dL (ref 3.5–5.2)
Alkaline Phosphatase: 110 U/L (ref 39–117)
BUN: 15 mg/dL (ref 6–23)
CO2: 27 meq/L (ref 19–32)
Calcium: 9.3 mg/dL (ref 8.4–10.5)
Chloride: 100 meq/L (ref 96–112)
Creat: 0.91 mg/dL (ref 0.50–1.10)
Glucose, Bld: 80 mg/dL (ref 70–99)
Potassium: 4.2 meq/L (ref 3.5–5.3)
Sodium: 136 meq/L (ref 135–145)
Total Bilirubin: 0.5 mg/dL (ref 0.3–1.2)
Total Protein: 7.2 g/dL (ref 6.0–8.3)

## 2012-05-08 LAB — POCT URINALYSIS DIPSTICK
Blood, UA: NEGATIVE
Glucose, UA: NEGATIVE
Leukocytes, UA: NEGATIVE
Nitrite, UA: NEGATIVE
Urobilinogen, UA: 0.2

## 2012-05-08 MED ORDER — IOHEXOL 300 MG/ML  SOLN
100.0000 mL | Freq: Once | INTRAMUSCULAR | Status: AC | PRN
  Administered 2012-05-08: 100 mL via INTRAVENOUS

## 2012-05-08 NOTE — Progress Notes (Signed)
Reviewed and agree.

## 2012-05-08 NOTE — Patient Instructions (Addendum)
1. Abdominal pain, LUQ  POCT urinalysis dipstick, POCT UA - Microscopic Only, POCT CBC, Comprehensive metabolic panel  2. Abdominal pain, epigastric  POCT urinalysis dipstick, POCT UA - Microscopic Only, POCT CBC, Comprehensive metabolic panel  3. Nausea  POCT urinalysis dipstick, POCT UA - Microscopic Only, POCT CBC, Comprehensive metabolic panel  4. Diarrhea  POCT urinalysis dipstick, POCT UA - Microscopic Only, POCT CBC, Comprehensive metabolic panel  5. Abdominal pain, LLQ  CT Abdomen Pelvis W Contrast     WE ARE SCHEDULING YOU FOR A CT OF THE ABDOMEN AND PELVIS.     Driving directions to [address] [place], [place], [place] [address] [id number]  - more info    [address]  [place], [place] [address]     1. Head south on [address] toward [address]      0.5 mi    2. Sharp left onto [address]      0.6 mi    3. Turn left onto [address]      0.2 mi    4. Merge [address]      3.0 mi    5. Continue straight to stay on [address]      0.4 mi    6. Slight left to stay on [facility]  Destination will be on the right     1.0 mi     [address] [place], [place] [address]

## 2012-05-08 NOTE — Progress Notes (Signed)
Subjective:    Patient ID: Yolanda [name], female    DOB: [birth year], 51 y.o.   MRN: [id number]  HPI This 51 y.o. female presents for evaluation of abdominal pain.  Onset of one wek of constipation.  Last Thursday, with onset of vomiting.  Continued for several days.  +diarrhea a lot for past several days.  A lot of abdominal pain with eating.  A lot of pressure in abdomin in epigastric and to L a little bit.  Movement triggers it; walking triggers it.  Sharp stabbing pain.  No fever Tmax 99.9.  No headache.  Abdominal pain umilical region and slightly to L and shoots to LUQ.  +vomiting x 3-4 on first day; food contents.  +nausea mild.  Diarrhea x 8-10 for two days; has slowed down 2-3 tmes per day.  Non-bloody, no-mucous.  No melena.  +alot of burping.  NO history of PUD.  History of L ovarian cyst.  Car ride was uncomfortable; coughing or sneezing causes pain.  No colonoscopy.  No recent travel; no recent antibiotics; no camping.  No history of diverticulitis.  Food sensitivities; cannot tolerate raw vegetables.  Drinks alcohol 2-3 glasses of wine weekly.  PCP:  Sherene Sires and Fontaine Psurg:  1.  Jaw Surgery  2. Hysterectomy for fibroids; ovaries intact.    Review of Systems  Constitutional: Positive for chills and fatigue. Negative for fever.  Respiratory: Negative for shortness of breath.   Cardiovascular: Negative for chest pain.  Gastrointestinal: Positive for nausea, vomiting, abdominal pain, diarrhea and abdominal distention. Negative for constipation, blood in stool, anal bleeding and rectal pain.  Genitourinary: Negative for dysuria, urgency, frequency, hematuria and flank pain.       Past Medical History  Diagnosis Date  . Depression   . IC (interstitial cystitis)     Past Surgical History  Procedure Date  . Tmj arthroscopy 11-26-2001  . Hysteroscopy 12/2003    RESECTON OF SUBMUCOUS  MYOMA/DX SCOPE  . Pelvic laparoscopy 12/2003    DX SCOPE/RESECTION OF SUBMUCOUS MYOMA  . Vaginal  hysterectomy 12/05    Prior to Admission medications   Medication Sig Start Date End Date Taking? Authorizing Provider  BuPROPion HCl (WELLBUTRIN PO) Take by mouth.     Yes Historical Provider, MD  fexofenadine (ALLEGRA) 30 MG tablet Take 30 mg by mouth 2 (two) times daily.   Yes Historical Provider, MD  mometasone (NASONEX) 50 MCG/ACT nasal spray Place 2 sprays into the nose daily. 12/07/11 12/06/12 Yes Gwenlyn Found Copland, MD  Multiple Vitamin (MULTIVITAMIN) tablet Take 1 tablet by mouth daily.     Yes Historical Provider, MD  olopatadine (PATANOL) 0.1 % ophthalmic solution Place 1 drop into both eyes 2 (two) times daily. 12/07/11 12/06/12 Yes Pearline Cables, MD    No Known Allergies  History   Social History  . Marital Status: Married    Spouse Name: N/A    Number of Children: N/A  . Years of Education: N/A   Occupational History  . Not on file.   Social History Main Topics  . Smoking status: Never Smoker   . Smokeless tobacco: Never Used  . Alcohol Use: 1.2 oz/week    2 Glasses of wine per week     week  . Drug Use: No  . Sexually Active: Yes    Birth Control/ Protection: Surgical   Other Topics Concern  . Not on file   Social History Narrative  . No narrative on file    Family History  Problem Relation Age of Onset  . Breast cancer Sister     HALF SISTER-DIFFERENT MOTHER  . Breast cancer Brother     HALF SISTER (DIFFERENT MOTHER)  . Diabetes Brother     HALF BROTHER   . Cancer Maternal Grandmother     UTERINE  . Breast cancer Sister     HALF SISTER (DIFFERENT MOTHER)  . Diabetes Brother     HALF BROTHER  . Cystic fibrosis Other     Objective:   Physical Exam  Constitutional: She is oriented to person, place, and time. She appears well-developed and well-nourished. No distress.  HENT:  Mouth/Throat: Oropharynx is clear and moist.  Eyes: Conjunctivae normal are normal. Pupils are equal, round, and reactive to light.  Neck: Normal range of motion. Neck  supple.  Cardiovascular: Regular rhythm and intact distal pulses.  Tachycardia present.   Pulmonary/Chest: Effort normal and breath sounds normal. No respiratory distress. She has no wheezes. She has no rales.  Abdominal: Soft. Bowel sounds are normal. She exhibits no distension and no mass. There is tenderness in the epigastric area, periumbilical area, suprapubic area and left lower quadrant. There is no rigidity, no rebound, no guarding, no CVA tenderness and negative Murphy's sign. No hernia.  Neurological: She is alert and oriented to person, place, and time. No cranial nerve deficit. She exhibits normal muscle tone.  Skin: Skin is warm and dry. She is not diaphoretic.  Psychiatric: She has a normal mood and affect. Her behavior is normal. Judgment and thought content normal.       Results for orders placed in visit on 05/08/12  POCT URINALYSIS DIPSTICK      Component Value Range   Color, UA yellow     Clarity, UA clear     Glucose, UA neg     Bilirubin, UA neg     Ketones, UA neg     Spec Grav, UA 1.020     Blood, UA neg     pH, UA 6.0     Protein, UA neg     Urobilinogen, UA 0.2     Nitrite, UA neg     Leukocytes, UA Negative    POCT UA - MICROSCOPIC ONLY      Component Value Range   WBC, Ur, HPF, POC 0-1     RBC, urine, microscopic neg     Bacteria, U Microscopic trace     Mucus, UA neg     Epithelial cells, urine per micros 3-5     Crystals, Ur, HPF, POC neg     Casts, Ur, LPF, POC neg     Yeast, UA neg    POCT CBC      Component Value Range   WBC 5.2  4.6 - 10.2 K/uL   Lymph, poc 1.0  0.6 - 3.4   POC LYMPH PERCENT 20.1  10 - 50 %L   MID (cbc) 0.5  0 - 0.9   POC MID % 10.1  0 - 12 %M   POC Granulocyte 3.6  2 - 6.9   Granulocyte percent 69.8  37 - 80 %G   RBC 4.68  4.04 - 5.48 M/uL   Hemoglobin 12.9  12.2 - 16.2 g/dL   HCT, POC 16.1  09.6 - 47.9 %   MCV 87.6  80 - 97 fL   MCH, POC 27.6  27 - 31.2 pg   MCHC 31.5 (*) 31.8 - 35.4 g/dL   RDW, POC 12.5  Platelet Count, POC 223  142 - 424 K/uL   MPV 7.4  0 - 99.8 fL    Assessment & Plan:   1. Abdominal pain, LUQ  POCT urinalysis dipstick, POCT UA - Microscopic Only, POCT CBC, Comprehensive metabolic panel  2. Abdominal pain, epigastric  POCT urinalysis dipstick, POCT UA - Microscopic Only, POCT CBC, Comprehensive metabolic panel  3. Nausea  POCT urinalysis dipstick, POCT UA - Microscopic Only, POCT CBC, Comprehensive metabolic panel  4. Diarrhea  POCT urinalysis dipstick, POCT UA - Microscopic Only, POCT CBC, Comprehensive metabolic panel  5.  Abdominal pain LLQ    1.  Abdominal Pain Periumbilical and LLQ: New.  Associated with vomiting, diarrhea.  Pain prominent symptom at this time; refer for CT abd/pelvis to evaluate for diverticulitis versus atypical appendicitis.   2.  Vomiting with diarrhea: New.  Obtain labs.  BRAT diet, fluids.  Recommend Zantac 150mg  bid for next week for epigastric pain.  Good hydration status.

## 2012-05-09 ENCOUNTER — Telehealth: Payer: Self-pay

## 2012-05-09 NOTE — Telephone Encounter (Signed)
Patient is not any better. She is advised of her results and she has been called about labs already. I told her we will call with additional lab reports when they are available.

## 2012-05-09 NOTE — Telephone Encounter (Signed)
Call -- 1. No evidence of diverticulitis or appendicitis on CT scan. 2. L ovarian cyst present as patient is aware. 3. Mild prominence of pancreas duct but do not think this is related to current pain; pancreas is located under L rib cage and patient's pain is lower abdomen. If she develops pain close to L rib cage, please have her contact office. I will also add pancreas labs to current blood work. Recommend very bland diet (applesauce, toast, crackers, bananas) for the next several days. Also encourage good fluid intake. If not significantly better in 72 hours, please have patient call me with update. KMS

## 2012-05-09 NOTE — Telephone Encounter (Signed)
Pt husband called in an states that pt was given results of lab work and he states that the information given wasn't really given correctly or stated correctly that he still has some concerns and would like to speak with a doctor. [phone]

## 2012-05-09 NOTE — Telephone Encounter (Signed)
Patient wants to know about her ct scan results.  SAW DR Katrinka Blazing.  [phone]

## 2012-05-10 ENCOUNTER — Telehealth: Payer: Self-pay | Admitting: *Deleted

## 2012-05-10 ENCOUNTER — Encounter: Payer: Self-pay | Admitting: Gynecology

## 2012-05-10 ENCOUNTER — Ambulatory Visit (INDEPENDENT_AMBULATORY_CARE_PROVIDER_SITE_OTHER): Payer: BC Managed Care – PPO

## 2012-05-10 ENCOUNTER — Ambulatory Visit (INDEPENDENT_AMBULATORY_CARE_PROVIDER_SITE_OTHER): Payer: BC Managed Care – PPO | Admitting: Gynecology

## 2012-05-10 VITALS — BP 130/82

## 2012-05-10 DIAGNOSIS — N831 Corpus luteum cyst of ovary, unspecified side: Secondary | ICD-10-CM

## 2012-05-10 DIAGNOSIS — N839 Noninflammatory disorder of ovary, fallopian tube and broad ligament, unspecified: Secondary | ICD-10-CM

## 2012-05-10 DIAGNOSIS — N949 Unspecified condition associated with female genital organs and menstrual cycle: Secondary | ICD-10-CM

## 2012-05-10 DIAGNOSIS — N3010 Interstitial cystitis (chronic) without hematuria: Secondary | ICD-10-CM

## 2012-05-10 DIAGNOSIS — R102 Pelvic and perineal pain: Secondary | ICD-10-CM

## 2012-05-10 DIAGNOSIS — N83209 Unspecified ovarian cyst, unspecified side: Secondary | ICD-10-CM

## 2012-05-10 DIAGNOSIS — R109 Unspecified abdominal pain: Secondary | ICD-10-CM

## 2012-05-10 DIAGNOSIS — K8689 Other specified diseases of pancreas: Secondary | ICD-10-CM | POA: Insufficient documentation

## 2012-05-10 DIAGNOSIS — R197 Diarrhea, unspecified: Secondary | ICD-10-CM

## 2012-05-10 DIAGNOSIS — N8300 Follicular cyst of ovary, unspecified side: Secondary | ICD-10-CM

## 2012-05-10 LAB — CBC WITH DIFFERENTIAL/PLATELET

## 2012-05-10 LAB — URINALYSIS W MICROSCOPIC + REFLEX CULTURE
Bilirubin Urine: NEGATIVE
Glucose, UA: NEGATIVE mg/dL
Leukocytes, UA: NEGATIVE
Protein, ur: NEGATIVE mg/dL
Specific Gravity, Urine: 1.02 (ref 1.005–1.030)
Urobilinogen, UA: 0.2 mg/dL (ref 0.0–1.0)

## 2012-05-10 LAB — COMPREHENSIVE METABOLIC PANEL
Albumin: 4.4 g/dL (ref 3.5–5.2)
Alkaline Phosphatase: 99 U/L (ref 39–117)
BUN: 12 mg/dL (ref 6–23)
CO2: 25 mEq/L (ref 19–32)
Glucose, Bld: 77 mg/dL (ref 70–99)
Sodium: 136 mEq/L (ref 135–145)
Total Bilirubin: 0.5 mg/dL (ref 0.3–1.2)
Total Protein: 7.1 g/dL (ref 6.0–8.3)

## 2012-05-10 LAB — LIPASE: Lipase: 13 U/L (ref 0–75)

## 2012-05-10 LAB — AMYLASE: Amylase: 45 U/L (ref 0–105)

## 2012-05-10 MED ORDER — KETOROLAC TROMETHAMINE 10 MG PO TABS: 10.0000 mg | ORAL_TABLET | Freq: Four times a day (QID) | ORAL | Status: DC | PRN

## 2012-05-10 MED ORDER — MEDROXYPROGESTERONE ACETATE 150 MG/ML IM SUSP
150.0000 mg | Freq: Once | INTRAMUSCULAR | Status: AC
  Administered 2012-05-10: 150 mg via INTRAMUSCULAR

## 2012-05-10 MED ORDER — KETOROLAC TROMETHAMINE 30 MG/ML IJ SOLN
30.0000 mg | Freq: Once | INTRAMUSCULAR | Status: AC
  Administered 2012-05-10: 30 mg via INTRAMUSCULAR

## 2012-05-10 NOTE — Telephone Encounter (Signed)
Left message to call me back about the labs. I spoke to patient yesterday about scan results and advised labs to be added. Will speak to patient or husband or both to see what I can help with.

## 2012-05-10 NOTE — Telephone Encounter (Signed)
Pt informed appointment with Dr.Patterson 05/11/12 @ 9:30 am. At [facility]

## 2012-05-10 NOTE — Progress Notes (Signed)
Patient is a 51 year old (past history of transvaginal hysterectomy in 2005 for symptomatic fibroids) was seen at the urgent care on September 17 complaining of abdominal pain in one week history of intermittent constipation and diarrhea. She states her diarrhea is were not bloody. She experienced a lot of abdominal pain with heating the discomfort was in the epigastric region but also suprapubic. She stated also she had some left lower quadrant discomfort. She also had reported having had some nausea and vomiting. She does stated she's had a lot of belching. She denies any history in the past of any GI abnormalities her family history of GI malignancy. She states that she has 2-3 glasses on a weekly basis. She denied any fever. Patient currently taking Wellbutrin for depression and Nasonex nasal when necessary and Patanol ophthalmic solution to both eyes daily. Patient denies any allergies.  At the urgent care she had the following labs: Urinalysis was negative CBC: White blood count 5.2 hemoglobin and hematocrit 12.9 and 41.0 respectively with a platelet count 223,000 Comprehensive metabolic panel was normal as was her lipase, and amylase  Patient had a CT of the abdomen and pelvis yesterday with contrast with a final impression by the radiologist:  IMPRESSION:  Left adnexal cyst measuring 2.7 cm is likely physiologic. If the  patient is post menopausal, consider further evaluation with an  ultrasound follow-up.  Main pancreatic duct is mildly prominent at 3 mm. No obstructing  lesion identified. Consider a pancreas MR/MRCP follow-up to more  definitively characterize.   She presented to the office today for further evaluation of this ovarian cyst in the symptoms noted above.  Exam: Abdomen: Tender epigastric region with some guarding but no rebound positive bowel sounds all 4 quadrants otherwise soft but slightly tender in the suprapubic area Pelvic: Bartholin urethra Skene was within normal  limits Vagina: No lesions or discharge Cervix: Absent Bimanual: No palpable masses slight tenderness on the left adnexa slight fullness Rectal exam: Not done  Ultrasound in our office today: Absent uterus several follicles were noted on the right ovary left ovary had a thin-walled solid mass with positive color flow the periphery measuring 3.1 x 2.5 x 2.5 cm (average size 2.7 mm) no fluid in the cul-de-sac.   Assessment/plan: Symptomatology does not with a small cyst on her left ovary. Patient has had history many years ago of interstitial cystitis and is currently on no treatment. We did repeat her urinalysis today which was negative. We are repeating her CBC as well as her comprehensive metabolic panel and lipase amylase to see if there has been any change in the past 48 hours. Because of her symptoms and her CT scan demonstrating her main pancreatic duct mildly prominent at 3 mm I want to refer her to my GI colleague (Dr. Jarold Motto) for further evaluation. He will be able to look at these labs that were ordered today on [facility] as well. Patient denies having eaten anywhere differently or having been outside the country. She does not report anyone else in her family been sick. This ovarian cyst may be a small 8 or hemorrhagic cyst or small endometrioma. She will be given a shot of Depo-Provera 150 mg IM today and will return to the office in 2 months for an ultrasound for followup. I have given her a shot of Toradol 30 mg IM and have called in a prescription for Toradol 10 mg by mouth every 6 hours not to exceed 5 days. Literature information on ovarian cyst was  provided. We did obtain also a CA 125 its limitations were discussed as well. Will await also to hear from her GI colleagues. All questions are answered and we'll follow accordingly.

## 2012-05-10 NOTE — Patient Instructions (Signed)
Ovarian Cyst The ovaries are small organs that are on each side of the uterus. The ovaries are the organs that produce the female hormones, estrogen and progesterone. An ovarian cyst is a sac filled with fluid that can vary in its size. It is normal for a small cyst to form in women who are in the childbearing age and who have menstrual periods. This type of cyst is called a follicle cyst that becomes an ovulation cyst (corpus luteum cyst) after it produces the women's egg. It later goes away on its own if the woman does not become pregnant. There are other kinds of ovarian cysts that may cause problems and may need to be treated. The most serious problem is a cyst with cancer. It should be noted that menopausal women who have an ovarian cyst are at a higher risk of it being a cancer cyst. They should be evaluated very quickly, thoroughly and followed closely. This is especially true in menopausal women because of the high rate of ovarian cancer in women in menopause. CAUSES AND TYPES OF OVARIAN CYSTS:  FUNCTIONAL CYST: The follicle/corpus luteum cyst is a functional cyst that occurs every month during ovulation with the menstrual cycle. They go away with the next menstrual cycle if the woman does not get pregnant. Usually, there are no symptoms with a functional cyst.   ENDOMETRIOMA CYST: This cyst develops from the lining of the uterus tissue. This cyst gets in or on the ovary. It grows every month from the bleeding during the menstrual period. It is also called a "chocolate cyst" because it becomes filled with blood that turns brown. This cyst can cause pain in the lower abdomen during intercourse and with your menstrual period.   CYSTADENOMA CYST: This cyst develops from the cells on the outside of the ovary. They usually are not cancerous. They can get very big and cause lower abdomen pain and pain with intercourse. This type of cyst can twist on itself, cut off its blood supply and cause severe pain.  It also can easily rupture and cause a lot of pain.   DERMOID CYST: This type of cyst is sometimes found in both ovaries. They are found to have different kinds of body tissue in the cyst. The tissue includes skin, teeth, hair, and/or cartilage. They usually do not have symptoms unless they get very big. Dermoid cysts are rarely cancerous.   POLYCYSTIC OVARY: This is a rare condition with hormone problems that produces many small cysts on both ovaries. The cysts are follicle-like cysts that never produce an egg and become a corpus luteum. It can cause an increase in body weight, infertility, acne, increase in body and facial hair and lack of menstrual periods or rare menstrual periods. Many women with this problem develop type 2 diabetes. The exact cause of this problem is unknown. A polycystic ovary is rarely cancerous.   THECA LUTEIN CYST: Occurs when too much hormone (human chorionic gonadotropin) is produced and over-stimulates the ovaries to produce an egg. They are frequently seen when doctors stimulate the ovaries for invitro-fertilization (test tube babies).   LUTEOMA CYST: This cyst is seen during pregnancy. Rarely it can cause an obstruction to the birth canal during labor and delivery. They usually go away after delivery.  SYMPTOMS   Pelvic pain or pressure.   Pain during sexual intercourse.   Increasing girth (swelling) of the abdomen.   Abnormal menstrual periods.   Increasing pain with menstrual periods.   You stop having   menstrual periods and you are not pregnant.  DIAGNOSIS  The diagnosis can be made during:  Routine or annual pelvic examination (common).   Ultrasound.   X-ray of the pelvis.   CT Scan.   MRI.   Blood tests.  TREATMENT   Treatment may only be to follow the cyst monthly for 2 to 3 months with your caregiver. Many go away on their own, especially functional cysts.   May be aspirated (drained) with a long needle with ultrasound, or by laparoscopy  (inserting a tube into the pelvis through a small incision).   The whole cyst can be removed by laparoscopy.   Sometimes the cyst may need to be removed through an incision in the lower abdomen.   Hormone treatment is sometimes used to help dissolve certain cysts.   Birth control pills are sometimes used to help dissolve certain cysts.  HOME CARE INSTRUCTIONS  Follow your caregiver's advice regarding:  Medicine.   Follow up visits to evaluate and treat the cyst.   You may need to come back or make an appointment with another caregiver, to find the exact cause of your cyst, if your caregiver is not a gynecologist.   Get your yearly and recommended pelvic examinations and Pap tests.   Let your caregiver know if you have had an ovarian cyst in the past.  SEEK MEDICAL CARE IF:   Your periods are late, irregular, they stop, or are painful.   Your stomach (abdomen) or pelvic pain does not go away.   Your stomach becomes larger or swollen.   You have pressure on your bladder or trouble emptying your bladder completely.   You have painful sexual intercourse.   You have feelings of fullness, pressure, or discomfort in your stomach.   You lose weight for no apparent reason.   You feel generally ill.   You become constipated.   You lose your appetite.   You develop acne.   You have an increase in body and facial hair.   You are gaining weight, without changing your exercise and eating habits.   You think you are pregnant.  SEEK IMMEDIATE MEDICAL CARE IF:   You have increasing abdominal pain.   You feel sick to your stomach (nausea) and/or vomit.   You develop a fever that comes on suddenly.   You develop abdominal pain during a bowel movement.   Your menstrual periods become heavier than usual.  Document Released: 08/08/2005 Document Revised: 07/28/2011 Document Reviewed: 06/11/2009 [employer] Patient Information 2012 [employer]. 

## 2012-05-11 ENCOUNTER — Ambulatory Visit (INDEPENDENT_AMBULATORY_CARE_PROVIDER_SITE_OTHER): Payer: BC Managed Care – PPO | Admitting: Gastroenterology

## 2012-05-11 ENCOUNTER — Encounter: Payer: Self-pay | Admitting: Gastroenterology

## 2012-05-11 VITALS — BP 120/76 | HR 88 | Ht 61.5 in | Wt 133.0 lb

## 2012-05-11 DIAGNOSIS — R109 Unspecified abdominal pain: Secondary | ICD-10-CM

## 2012-05-11 DIAGNOSIS — R112 Nausea with vomiting, unspecified: Secondary | ICD-10-CM

## 2012-05-11 LAB — CA 125: CA 125: 3 U/mL (ref 0.0–30.2)

## 2012-05-11 NOTE — Progress Notes (Signed)
History of Present Illness:  This is a very nice 51 year old Caucasian female referred for evaluation of one-week history of lower abdominal discomfort initially manifested as lower nominal pain, nausea vomiting, and watery diarrhea. This seemed to revolve over 24 hours, and she subsequently continue with some vague lower abdominal discomfort which has 95% resolved., had a CT scan which showed a left ovarian cyst, and was referred to Dr. Lily Peer in gynecology. The patient was treated with a shot of Depakote Provera and I am tramadol and is currently asymptomatic in terms of lower nominal discomfort or bowel or regularity. There is no previous history of gastrointestinal problems, diverticulitis, but she does have dyspepsia and epigastric abdominal pain if she" eats the wrong foods". The patient denies abuse of alcohol, cigarettes, or NSAIDs. Review of her labs shows normal CBC, liver function tests, amylase and lipase. CT scan of the abdomen was reported as showing a minimally dilated 3 mm pancreatic duct. This was reviewed today by myself and Dr. Christella Hartigan, and CT scan was felt to be normal except for some possible debris in her gallbladder. The patient denies any specific hepatobiliary symptomatology. Family history is noncontributory she has not had previous barium studies or endoscopic exams except for possibly an endoscopy in [facility] when she was in her 8s.  I have reviewed this patient's present history, medical and surgical past history, allergies and medications.     ROS: The remainder of the 10 point ROS is negative     Physical Exam: Blood pressure 120/76, pulse 80 and regular, weight 133 pounds with BMI of 24.72. General well developed well nourished patient in no acute distress, appearing their her age Eyes PERRLA, no icterus, fundoscopic exam per opthamologist Skin no lesions noted Neck supple, no adenopathy, no thyroid enlargement, no tenderness Chest clear to percussion  and auscultation Heart no significant murmurs, gallops or rubs noted Abdomen no hepatosplenomegaly masses or tenderness, BS normal.  Rectal inspection normal no fissures, or fistulae noted.  No masses or tenderness on digital exam. Stool guaiac negative. Extremities no acute joint lesions, edema, phlebitis or evidence of cellulitis. Neurologic patient oriented x 3, cranial nerves intact, no focal neurologic deficits noted. Psychological mental status normal and normal affect.  Assessment and plan: Resolving left lower quadrant abdominal pain possibly related to leakage of an ovarian cyst with associated nausea and vomiting x1 that resolved. I do not think this patient has had pancreatitis, and review of her CT scan shows no significant pancreatic duct dilatation. Symptoms and findings are not consistent with diverticulitis. I will schedule gallbladder ultrasound to exclude cholelithiasis. She is to call if she has further GI issues, and I've related at age 51 she will need screening colonoscopy. I also advised her about possible mucosal damage from NSAID use, and to use Pepcid AC twice a day if needed. Gynecologic followup and pelvic ultrasound scheduled in 2 months time with Dr. Lily Peer.      Please send this note to Dr. Lily Peer in gynecology, and to primary care, Dr. Sandrea Hughs   Encounter Diagnoses  Name Primary?  . Abdominal pain Yes  . Nausea & vomiting

## 2012-05-11 NOTE — Patient Instructions (Addendum)
You have been scheduled for an abdominal ultrasound at [facility] (1st floor of hospital) on 05-14-12 at 8:30 am. Please arrive 15 minutes prior to your appointment for registration. Make certain not to have anything to eat or drink 6 hours prior to your appointment. Should you need to reschedule your appointment, please contact radiology at [phone].  CC: Reynaldo Minium, M. D.

## 2012-05-12 NOTE — Telephone Encounter (Signed)
Addressed through lab results, I answered pt's questions/got update and routed to Dr Katrinka Blazing.

## 2012-05-14 ENCOUNTER — Ambulatory Visit (HOSPITAL_COMMUNITY)
Admission: RE | Admit: 2012-05-14 | Discharge: 2012-05-14 | Disposition: A | Discharge status: Home / Self Care | Payer: BC Managed Care – PPO | Source: Ambulatory Visit | Attending: Gastroenterology | Admitting: Gastroenterology

## 2012-05-14 DIAGNOSIS — R109 Unspecified abdominal pain: Secondary | ICD-10-CM | POA: Insufficient documentation

## 2012-05-14 DIAGNOSIS — R112 Nausea with vomiting, unspecified: Secondary | ICD-10-CM | POA: Insufficient documentation

## 2012-06-03 ENCOUNTER — Ambulatory Visit: Payer: BC Managed Care – PPO | Admitting: Physician Assistant

## 2012-06-03 VITALS — BP 120/78 | HR 85 | Temp 98.5°F | Resp 16 | Ht 61.5 in | Wt 138.4 lb

## 2012-06-03 DIAGNOSIS — R300 Dysuria: Secondary | ICD-10-CM

## 2012-06-03 DIAGNOSIS — N390 Urinary tract infection, site not specified: Secondary | ICD-10-CM

## 2012-06-03 LAB — POCT URINALYSIS DIPSTICK
Bilirubin, UA: NEGATIVE
Glucose, UA: NEGATIVE
Ketones, UA: NEGATIVE
pH, UA: 6.5

## 2012-06-03 LAB — POCT UA - MICROSCOPIC ONLY: Mucus, UA: NEGATIVE

## 2012-06-03 MED ORDER — PHENAZOPYRIDINE HCL 200 MG PO TABS: 200.0000 mg | ORAL_TABLET | Freq: Three times a day (TID) | ORAL | Status: DC | PRN

## 2012-06-03 MED ORDER — NITROFURANTOIN MONOHYD MACRO 100 MG PO CAPS: 100.0000 mg | ORAL_CAPSULE | Freq: Two times a day (BID) | ORAL | Status: AC

## 2012-06-03 NOTE — Progress Notes (Signed)
   [address] [place] [address]   Phone [phone]  Subjective:    Patient ID: Yolanda [name], female    DOB: 06-10-1961, 51 y.o.   MRN: [id number]  HPI Pt presents to clinic with 12 h h/o dysuria and urinary frequency with urgency.  She woke up this am feeling fine and her symptoms have progressively gotten worse through the day.  She is having bladder pressure but no back pain, N/V/D or fever/chills.  She has not had a UTI in many years.   Review of Systems  Constitutional: Negative for fever and chills.  Gastrointestinal: Positive for abdominal pain (some pressure). Negative for nausea and diarrhea.  Genitourinary: Positive for dysuria, urgency and frequency. Negative for hematuria.  Musculoskeletal: Negative for back pain.       Objective:   Physical Exam  Vitals reviewed. Constitutional: She is oriented to person, place, and time. She appears well-developed and well-nourished.  HENT:  Head: Normocephalic and atraumatic.  Right Ear: External ear normal.  Left Ear: External ear normal.  Eyes: Conjunctivae normal are normal.  Cardiovascular: Normal rate, regular rhythm and normal heart sounds.   Pulmonary/Chest: Effort normal and breath sounds normal.  Abdominal: Soft. Bowel sounds are normal. There is tenderness (suprapubic TTP) in the suprapubic area. There is no CVA tenderness.  Neurological: She is alert and oriented to person, place, and time.  Skin: Skin is warm and dry.  Psychiatric: She has a normal mood and affect. Her behavior is normal. Judgment and thought content normal.    Results for orders placed in visit on 06/03/12  POCT URINALYSIS DIPSTICK      Component Value Range   Color, UA yellow     Clarity, UA cloudy     Glucose, UA neg     Bilirubin, UA neg     Ketones, UA neg     Spec Grav, UA <=1.005     Blood, UA moderate     pH, UA 6.5     Protein, UA neg     Urobilinogen, UA 0.2     Nitrite, UA neg     Leukocytes, UA large (3+)    POCT UA -  MICROSCOPIC ONLY      Component Value Range   WBC, Ur, HPF, POC 10-15     RBC, urine, microscopic 1-3     Bacteria, U Microscopic trace     Mucus, UA neg     Epithelial cells, urine per micros neg     Crystals, Ur, HPF, POC neg     Casts, Ur, LPF, POC neg     Yeast, UA neg          Assessment & Plan:   1. Dysuria  POCT urinalysis dipstick, POCT UA - Microscopic Only  2. UTI (lower urinary tract infection)  nitrofurantoin, macrocrystal-monohydrate, (MACROBID) 100 MG capsule, phenazopyridine (PYRIDIUM) 200 MG tablet, Urine culture   Push fluids.  Take abx.  F/u if problems.

## 2012-06-25 ENCOUNTER — Ambulatory Visit (INDEPENDENT_AMBULATORY_CARE_PROVIDER_SITE_OTHER): Payer: BC Managed Care – PPO | Admitting: Family Medicine

## 2012-06-25 ENCOUNTER — Ambulatory Visit: Payer: BC Managed Care – PPO

## 2012-06-25 VITALS — BP 112/74 | HR 88 | Temp 98.7°F | Resp 18 | Ht 62.0 in | Wt 137.6 lb

## 2012-06-25 DIAGNOSIS — T148XXA Other injury of unspecified body region, initial encounter: Secondary | ICD-10-CM

## 2012-06-25 DIAGNOSIS — M25579 Pain in unspecified ankle and joints of unspecified foot: Secondary | ICD-10-CM

## 2012-06-25 DIAGNOSIS — S93419A Sprain of calcaneofibular ligament of unspecified ankle, initial encounter: Secondary | ICD-10-CM

## 2012-06-25 DIAGNOSIS — M79609 Pain in unspecified limb: Secondary | ICD-10-CM

## 2012-06-25 MED ORDER — METHOCARBAMOL 500 MG PO TABS: 500.0000 mg | ORAL_TABLET | Freq: Two times a day (BID) | ORAL | Status: DC | PRN

## 2012-06-25 NOTE — Progress Notes (Signed)
[facility]:  Office Visit  Chief Complaint:  Chief Complaint  Patient presents with  . Ankle Injury    right ankle today- fell off a step stool     HPI: Yolanda [name] is a 51 y.o. female who complains of right ankle pain s/p falling a short stool while cleaning in her closet this AM. She has had pain along the lateral border , possibley inverted her foot after landing but is not sure. No swelling. 7/10 pain with ambulation. Has tried ICE no other sxs  Denies any prior injuries or surgeries to ankle  Past Medical History  Diagnosis Date  . Depression   . IC (interstitial cystitis)   . Allergy    Past Surgical History  Procedure Date  . Tmj arthroscopy 11-26-2001  . Hysteroscopy 12/2003    RESECTON OF SUBMUCOUS  MYOMA/DX SCOPE  . Pelvic laparoscopy 12/2003    DX SCOPE/RESECTION OF SUBMUCOUS MYOMA  . Vaginal hysterectomy 12/05   History   Social History  . Marital Status: Married    Spouse Name: N/A    Number of Children: N/A  . Years of Education: N/A   Social History Main Topics  . Smoking status: Never Smoker   . Smokeless tobacco: Never Used  . Alcohol Use: 1.2 oz/week    2 Glasses of wine per week     Comment: week  . Drug Use: No  . Sexually Active: Yes    Birth Control/ Protection: Surgical   Other Topics Concern  . None   Social History Narrative  . None   Family History  Problem Relation Age of Onset  . Breast cancer Sister     HALF SISTER-DIFFERENT MOTHER  . Breast cancer Sister     HALF SISTER (DIFFERENT MOTHER)  . Diabetes Brother     HALF BROTHER   . Cancer Maternal Grandmother     UTERINE  . Breast cancer Sister     HALF SISTER (DIFFERENT MOTHER)  . Diabetes Brother     HALF BROTHER  . Cystic fibrosis Other     Niece(Brother)  . Colon cancer Neg Hx    No Known Allergies Prior to Admission medications   Medication Sig Start Date End Date Taking? Authorizing Provider  BuPROPion HCl (WELLBUTRIN PO) Take by mouth.      Yes Historical Provider, MD  fexofenadine (ALLEGRA) 30 MG tablet Take 30 mg by mouth 2 (two) times daily as needed.    Yes Historical Provider, MD  LORazepam (ATIVAN) 0.5 MG tablet Take 0.5 mg by mouth as needed.   Yes Historical Provider, MD  mometasone (NASONEX) 50 MCG/ACT nasal spray Place 2 sprays into the nose daily. 12/07/11 12/06/12 Yes Gwenlyn Found Copland, MD  Multiple Vitamin (MULTIVITAMIN) tablet Take 1 tablet by mouth daily.     Yes Historical Provider, MD  olopatadine (PATANOL) 0.1 % ophthalmic solution Place 1 drop into both eyes 2 (two) times daily. 12/07/11 12/06/12 Yes Gwenlyn Found Copland, MD  ketorolac (TORADOL) 10 MG tablet Take 1 tablet (10 mg total) by mouth every 6 (six) hours as needed for pain. 05/10/12   Ok Edwards, MD  phenazopyridine (PYRIDIUM) 200 MG tablet Take 1 tablet (200 mg total) by mouth 3 (three) times daily as needed for pain. 06/03/12   Morrell Riddle, PA-C     ROS: The patient denies fevers, chills, night sweats, unintentional weight loss, chest pain, palpitations, wheezing, dyspnea on exertion, nausea, vomiting, abdominal pain, dysuria, hematuria, melena, numbness,  weakness, or tingling.  All other systems have been reviewed and were otherwise negative with the exception of those mentioned in the HPI and as above.    PHYSICAL EXAM: Filed Vitals:   06/25/12 1357  BP: 112/74  Pulse: 88  Temp: 98.7 F (37.1 C)  Resp: 18   Filed Vitals:   06/25/12 1357  Height: 5\' 2"  (1.575 m)  Weight: 137 lb 9.6 oz (62.415 kg)   Body mass index is 25.17 kg/(m^2).  General: Alert, no acute distress HEENT:  Normocephalic, atraumatic, oropharynx patent.  Cardiovascular:  Regular rate and rhythm, no rubs murmurs or gallops.  No Carotid bruits, radial pulse intact. No pedal edema.  Respiratory: Clear to auscultation bilaterally.  No wheezes, rales, or rhonchi.  No cyanosis, no use of accessory musculature GI: No organomegaly, abdomen is soft and non-tender, positive bowel  sounds.  No masses. Skin: No rashes. Neurologic: Facial musculature symmetric. Psychiatric: Patient is appropriate throughout our interaction. Lymphatic: No cervical lymphadenopathy Musculoskeletal: Gait intact. Knee-nl Tib-fib-nl Right ankle-no deformities, tender at lateral malleoli and along achiiles tendon, she has full dorsi and plantar extension, minimal swelling, no warmth, no ecchymosis, + DP, + posterior tib artery, full ROM but painful, 5/5 strength, sensation intact, neg anterior drawer, 2/2 ankle reflex Foot-nl  LABS: Results for orders placed in visit on 06/03/12  POCT URINALYSIS DIPSTICK      Component Value Range   Color, UA yellow     Clarity, UA cloudy     Glucose, UA neg     Bilirubin, UA neg     Ketones, UA neg     Spec Grav, UA <=1.005     Blood, UA moderate     pH, UA 6.5     Protein, UA neg     Urobilinogen, UA 0.2     Nitrite, UA neg     Leukocytes, UA large (3+)    POCT UA - MICROSCOPIC ONLY      Component Value Range   WBC, Ur, HPF, POC 10-15     RBC, urine, microscopic 1-3     Bacteria, U Microscopic trace     Mucus, UA neg     Epithelial cells, urine per micros neg     Crystals, Ur, HPF, POC neg     Casts, Ur, LPF, POC neg     Yeast, UA neg    URINE CULTURE      Component Value Range   Culture CITROBACTER KOSERI     Colony Count >=100,000 COLONIES/ML     Organism ID, Bacteria CITROBACTER KOSERI       EKG/XRAY:   Primary read interpreted by Dr. Conley Rolls at [facility]. No obvious fractures, no subluxation   ASSESSMENT/PLAN: Encounter Diagnoses  Name Primary?  Marland Kitchen Ankle pain Yes  . Sprain and strain     RICE OTC ibuprofen 600 mg q8h prn. She has Tramadol which she can take if she needs. I have written her a paper rx for Percocet 5/325 #30 q8 hrs prn No refills if she needs to fill it for pain then she can. ABC ROM exercises advise Cam Walker given  F/u in 1 week   LE, THAO PHUONG, DO 06/25/2012 2:52 PM

## 2012-06-26 MED ORDER — OXYCODONE-ACETAMINOPHEN 5-325 MG PO TABS: 1.0000 | ORAL_TABLET | Freq: Three times a day (TID) | ORAL | Status: DC | PRN

## 2012-07-11 ENCOUNTER — Encounter: Payer: BC Managed Care – PPO | Admitting: Gynecology

## 2012-07-11 ENCOUNTER — Encounter: Payer: Self-pay | Admitting: Gynecology

## 2012-07-11 ENCOUNTER — Ambulatory Visit: Payer: BC Managed Care – PPO

## 2012-07-11 ENCOUNTER — Other Ambulatory Visit: Payer: Self-pay | Admitting: *Deleted

## 2012-07-11 ENCOUNTER — Ambulatory Visit (INDEPENDENT_AMBULATORY_CARE_PROVIDER_SITE_OTHER): Payer: BC Managed Care – PPO

## 2012-07-11 ENCOUNTER — Ambulatory Visit (INDEPENDENT_AMBULATORY_CARE_PROVIDER_SITE_OTHER): Payer: BC Managed Care – PPO | Admitting: Gynecology

## 2012-07-11 VITALS — BP 124/78 | Ht 61.0 in | Wt 141.0 lb

## 2012-07-11 DIAGNOSIS — Z01419 Encounter for gynecological examination (general) (routine) without abnormal findings: Secondary | ICD-10-CM

## 2012-07-11 DIAGNOSIS — Z23 Encounter for immunization: Secondary | ICD-10-CM

## 2012-07-11 DIAGNOSIS — N83209 Unspecified ovarian cyst, unspecified side: Secondary | ICD-10-CM

## 2012-07-11 DIAGNOSIS — N8300 Follicular cyst of ovary, unspecified side: Secondary | ICD-10-CM

## 2012-07-11 DIAGNOSIS — N831 Corpus luteum cyst of ovary, unspecified side: Secondary | ICD-10-CM

## 2012-07-11 NOTE — Patient Instructions (Addendum)
Inactivated Influenza Vaccine What You Need to Know WHY GET VACCINATED?  Influenza ("flu") is a contagious disease.  It is caused by the influenza virus, which can be spread by coughing, sneezing, or nasal secretions.  Anyone can get influenza, but rates of infection are highest among children. For most people, symptoms last only a few days. They include:  Fever or chills.  Sore throat.  Muscle aches.  Fatigue.  Cough.  Headache.  Runny or stuffy nose. Other illnesses can have the same symptoms and are often mistaken for influenza. Young children, people 65 and older, pregnant women, and people with certain health conditions, such as heart, lung or kidney disease, or a weakened immune system can get much sicker. Flu can cause high fever and pneumonia, and make existing medical conditions worse. It can cause diarrhea and seizures in children. Each year thousands of people die from influenza and even more require hospitalization. By getting flu vaccine, you can protect yourself from influenza and may also avoid spreading influenza to others. INACTIVATED INFLUENZA VACCINE There are 2 types of influenza vaccine:  Inactivated (killed) vaccine, the "flu shot", is given by injection with a needle.  Live, attenuated (weakened) influenza vaccine is sprayed into the nostrils. This vaccine is described in a separate Vaccine Information Statement. A "high-dose" inactivated influenza vaccine is available for people 65 years of age and older. Ask your doctor for more information.  Influenza viruses are always changing, so annual vaccination is recommended. Each year scientists try to match the viruses in the vaccine to those most likely to cause flu that year. Flu vaccine will not prevent disease from other viruses, including flu viruses not contained in the vaccine. It takes up to 2 weeks for protection to develop after the shot. Protection lasts about 1 year. Some inactivated influenza vaccine  contains a preservative called thimerosal. Thimerosal-free influenza vaccine is available. Ask your doctor for more information. WHO SHOULD GET INACTIVATED INFLUENZA VACCINE AND WHEN? WHO  All people 6 months of age and older should get flu vaccine.  Vaccination is especially important for people at higher risk of severe influenza and their close contacts, including healthcare personnel and close contacts of children younger than 6 months. WHEN Getting the vaccine as soon as it is available. This should provide protection if the flu season comes early. You can get the vaccine as long as illness is occurring in your community. Influenza can occur at any time, but most influenza occurs from October through May. In recent seasons, most infections have occurred in January and February. Getting vaccinated in December, or even later, will still be beneficial in most years. Adults and older children need 1 dose of influenza vaccine each year. But some children younger than 9 years of age need 2 doses to be protected. Ask your doctor. Influenza vaccine may be given at the same time as other vaccines, including pneumococcal vaccine. SOME PEOPLE SHOULD NOT GET INACTIVATED INFLUENZA VACCINE OR SHOULD WAIT  Tell your doctor if you have any severe (life-threatening) allergies, including a severe allergy to eggs. A severe allergy to any vaccine component may be a reason not to get the vaccine. Allergic reactions to influenza vaccine are rare.  Tell your doctor if you ever had a severe reaction after a dose of influenza vaccine.  Tell your doctor if you ever had Guillain-Barr syndrome (a severe paralytic illness, also called GBS). Your doctor will help you decide whether the vaccine is recommended for you.  People who are   moderately or severely ill should usually wait until they recover before getting the flu vaccine. If you are ill, talk to your doctor about whether to reschedule the vaccination. People with  a mild illness can usually get the vaccine. WHAT ARE THE RISKS FROM INACTIVATED INFLUENZA VACCINE? A vaccine, like any medicine, could possibly cause serious problems, such as severe allergic reactions. The risk of a vaccine causing serious harm, or death, is extremely small. Serious problems from inactivated influenza vaccine are very rare. The viruses in inactivated influenza vaccine have been killed, so you cannot get influenza from the vaccine. Mild problems:  Soreness, redness, or swelling where the shot was given.  Hoarseness; sore, red or itchy eyes; cough.  Fever.  Aches.  Headache.  Itching.  Fatigue. If these problems occur, they usually begin soon after the shot and last 1 to 2 days. Moderate problems: Young children who get inactivated flu vaccine and pneumococcal vaccine (PCV13) at the same time appear to be at increased risk for seizures caused by fever. Ask your doctor for more information. Tell your doctor if a child who is getting flu vaccine has ever had a seizure. Severe problems:  Life-threatening allergic reactions from vaccines are very rare. If they do occur, it is usually within a few minutes to a few hours after the shot.  In 1976, a type of inactivated influenza (swine flu) vaccine was associated with Guillan-Barr syndrome (GBS). Since then, flu vaccines have not been clearly linked to GBS. However, if there is a risk of GBS from current flu vaccines, it would be no more than 1 or 2 cases per million people vaccinated. This is much lower than the risk of severe influenza, which can be prevented by vaccination. The safety of vaccines is always being monitored. For more information, visit:  www.cdc.gov/vaccinesafety/Vaccine_Monitoring/Index.html and  www.cdc.gov/vaccinesafety/Activities/Activities_Index.html One brand of inactivated flu vaccine, called Afluria, should not be given to children 8 years of age or younger, except in special circumstances. A  related vaccine was associated with fevers and fever-related seizures in young children in [place]. Your doctor can give you more information. WHAT IF THERE IS A SEVERE REACTION? What should I look for? Any unusual condition, such as a high fever or unusual behavior. Signs of a serious allergic reaction can include difficulty breathing, hoarseness or wheezing, hives, paleness, weakness, a fast heartbeat, or dizziness. What should I do?  Call a doctor, or get the person to a doctor right away.  Tell your doctor what happened, the date and time it happened, and when the vaccination was given.  Ask your doctor, nurse, or health department to report the reaction by filing a Vaccine Adverse Event Reporting System (VAERS) form. Or, you can file this report through the VAERS website at www.vaers.hhs.gov or by calling 1-800-822-7967. VAERS does not provide medical advice. THE NATIONAL VACCINE INJURY COMPENSATION PROGRAM The National Vaccine Injury Compensation Program (VICP) was created in 1986. People who believe they may have been injured by a vaccine can learn about the program and about filing a claim by calling 1-800-338-2382, or visiting the VICP website at www.hrsa.gov/vaccinecompensation HOW CAN I LEARN MORE?  Ask your doctor. They can give you the vaccine package insert or suggest other sources of information.  Call your local or state health department.  Contact the Centers for Disease Control and Prevention (CDC):  Call 1-800-232-4636 (1-800-CDC-INFO) or  Visit the CDC's website at www.cdc.gov/flu CDC Inactivated Influenza Vaccine VIS (02/21/11) Document Released: 06/02/2006 Document Revised: 02/07/2012 Document Reviewed:   02/21/2011 Exit[employer] Patient Information 2013 [employer].  

## 2012-07-11 NOTE — Progress Notes (Signed)
Yolanda [name] 07-15-1961 [id number]   History:    51 y.o.  for annual gyn exam this was for followup ultrasound on previous left and right ovarian cyst noted on ultrasound September 19 of this year will patient was complaining of low abdominal discomfort. See previous note for detail. She received Depo-Provera 150 mg IM with a followup ultrasound today. She is asymptomatic today. Her CA 125 was normal. Patient treated recently for urinary tract infection. Patient asymptomatic today. Patient with prior history of transvaginal hysterectomy. All her labs were done September this year were normal with the exception of cholesterol screening that was not done. Her mammogram was February this year which was normal. She does her monthly self breast examination. Patient with no prior history of abnormal Pap smears. BMI 26.64 kg/meter squared.  Past medical history,surgical history, family history and social history were all reviewed and documented in the EPIC chart.  Gynecologic History No LMP recorded. Patient has had a hysterectomy. Contraception: status post hysterectomy Last Pap: 2012. Results were: normal Last mammogram: 2013. Results were: normal  Obstetric History OB History    Grav Para Term Preterm Abortions TAB SAB Ect Mult Living   2 2 2       2      # Outc Date GA Lbr Len/2nd Wgt Sex Del Anes PTL Lv   1 TRM     F SVD  No Yes   2 TRM     F SVD  No Yes       ROS: A ROS was performed and pertinent positives and negatives are included in the history.  GENERAL: No fevers or chills. HEENT: No change in vision, no earache, sore throat or sinus congestion. NECK: No pain or stiffness. CARDIOVASCULAR: No chest pain or pressure. No palpitations. PULMONARY: No shortness of breath, cough or wheeze. GASTROINTESTINAL: No abdominal pain, nausea, vomiting or diarrhea, melena or bright red blood per rectum. GENITOURINARY: No urinary frequency, urgency, hesitancy or dysuria. MUSCULOSKELETAL: No joint or muscle  pain, no back pain, no recent trauma. DERMATOLOGIC: No rash, no itching, no lesions. ENDOCRINE: No polyuria, polydipsia, no heat or cold intolerance. No recent change in weight. HEMATOLOGICAL: No anemia or easy bruising or bleeding. NEUROLOGIC: No headache, seizures, numbness, tingling or weakness. PSYCHIATRIC: No depression, no loss of interest in normal activity or change in sleep pattern.     Exam: chaperone present  BP 124/78  Ht 5\' 1"  (1.549 m)  Wt 141 lb (63.957 kg)  BMI 26.64 kg/m2  Body mass index is 26.64 kg/(m^2).  General appearance : Well developed well nourished female. No acute distress HEENT: Neck supple, trachea midline, no carotid bruits, no thyroidmegaly Lungs: Clear to auscultation, no rhonchi or wheezes, or rib retractions  Heart: Regular rate and rhythm, no murmurs or gallops Breast:Examined in sitting and supine position were symmetrical in appearance, no palpable masses or tenderness,  no skin retraction, no nipple inversion, no nipple discharge, no skin discoloration, no axillary or supraclavicular lymphadenopathy Abdomen: no palpable masses or tenderness, no rebound or guarding Extremities: no edema or skin discoloration or tenderness  Pelvic:  Bartholin, Urethra, Skene Glands: Within normal limits             Vagina: No gross lesions or discharge  Cervix: Absent  Uterus absent  Adnexa  Without masses or tenderness  Anus and perineum  normal   Rectovaginal  normal sphincter tone without palpated masses or tenderness  Hemoccult not done     Assessment/Plan:  51 y.o. female with normal GYN exam. She was instructed take calcium and vitamin D with regular exercise for osteoporosis prevention. No Pap smear done today the new screening guidelines discussed. We'll see her back in one year or when necessary.   Ok Edwards MD, 11:18 AM 07/11/2012

## 2012-07-23 ENCOUNTER — Encounter: Payer: Self-pay | Admitting: Gynecology

## 2012-10-01 ENCOUNTER — Encounter: Payer: Self-pay | Admitting: Gynecology

## 2012-10-02 ENCOUNTER — Other Ambulatory Visit: Payer: Self-pay | Admitting: *Deleted

## 2012-10-02 DIAGNOSIS — R928 Other abnormal and inconclusive findings on diagnostic imaging of breast: Secondary | ICD-10-CM

## 2012-10-03 ENCOUNTER — Other Ambulatory Visit: Payer: Self-pay | Admitting: Gynecology

## 2012-10-03 DIAGNOSIS — R928 Other abnormal and inconclusive findings on diagnostic imaging of breast: Secondary | ICD-10-CM

## 2012-10-09 ENCOUNTER — Ambulatory Visit (INDEPENDENT_AMBULATORY_CARE_PROVIDER_SITE_OTHER): Payer: Managed Care, Other (non HMO) | Admitting: Physician Assistant

## 2012-10-09 VITALS — BP 135/89 | HR 85 | Temp 98.6°F | Resp 16 | Ht 61.5 in | Wt 140.4 lb

## 2012-10-09 DIAGNOSIS — J019 Acute sinusitis, unspecified: Secondary | ICD-10-CM

## 2012-10-09 DIAGNOSIS — R05 Cough: Secondary | ICD-10-CM

## 2012-10-09 DIAGNOSIS — R059 Cough, unspecified: Secondary | ICD-10-CM

## 2012-10-09 MED ORDER — HYDROCODONE-HOMATROPINE 5-1.5 MG/5ML PO SYRP: ORAL_SOLUTION | ORAL | Status: DC

## 2012-10-09 MED ORDER — AZITHROMYCIN 250 MG PO TABS: ORAL_TABLET | ORAL | Status: DC

## 2012-10-09 NOTE — Progress Notes (Signed)
Patient ID: Yolanda [name] MRN: [id number], DOB: Sep 04, 1960, 52 y.o. Date of Encounter: 10/09/2012, 10:31 AM  Primary Physician: Sandrea Hughs, MD  Chief Complaint:  Chief Complaint  Patient presents with  . Nasal Congestion    X 10 days. She started to feel better, but symptoms have returned.   . Cough    coughing up a brownish/yellow mucus  . Sore Throat    HPI: 52 y.o. year old female presents with a ten day history of nasal congestion, post nasal drip, sinus pressure, and cough. Subjective fever and cjills. T max the past couple of days around 99. Nasal congestion thick and green/yellow. Sinus pressure is the worst symptom along the right frontal and maxillary sinus. Cough is productive secondary to post nasal drip and worse at night, unless she gets into a coughing fit during the day. No wheezing or shortness of breath. Ears feel full, leading to sensation of muffled hearing. Has tried OTC cold preps without success. No GI complaints. Appetite decreased. Husband sick with similar symptoms. No recent antibiotics, or recent travels. No leg trauma, sedentary periods, h/o cancer, or tobacco use.  Past Medical History  Diagnosis Date  . Depression   . IC (interstitial cystitis)   . Allergy      Home Meds: Prior to Admission medications   Medication Sig Start Date End Date Taking? Authorizing Provider  BuPROPion HCl (WELLBUTRIN PO) Take by mouth.     Yes Historical Provider, MD  fexofenadine (ALLEGRA) 30 MG tablet Take 30 mg by mouth 2 (two) times daily as needed.    Yes Historical Provider, MD  LORazepam (ATIVAN) 0.5 MG tablet Take 0.5 mg by mouth as needed.   Yes Historical Provider, MD  mometasone (NASONEX) 50 MCG/ACT nasal spray Place 2 sprays into the nose daily. 12/07/11 12/06/12 Yes Gwenlyn Found Copland, MD  Multiple Vitamin (MULTIVITAMIN) tablet Take 1 tablet by mouth daily.     Yes Historical Provider, MD  olopatadine (PATANOL) 0.1 % ophthalmic solution Place 1 drop into both eyes  2 (two) times daily. 12/07/11 12/06/12 Yes Gwenlyn Found Copland, MD                ketorolac (TORADOL) 10 MG tablet Take 1 tablet (10 mg total) by mouth every 6 (six) hours as needed for pain. 05/10/12  No Ok Edwards, MD  methocarbamol (ROBAXIN) 500 MG tablet Take 1 tablet (500 mg total) by mouth 2 (two) times daily as needed. 06/25/12  No Thao P Le, DO  oxyCODONE-acetaminophen (PERCOCET/ROXICET) 5-325 MG per tablet Take 1 tablet by mouth every 8 (eight) hours as needed for pain. 06/26/12  No Thao P Le, DO  phenazopyridine (PYRIDIUM) 200 MG tablet Take 1 tablet (200 mg total) by mouth 3 (three) times daily as needed for pain. 06/03/12  No Morrell Riddle, PA-C    Allergies: No Known Allergies  History   Social History  . Marital Status: Married    Spouse Name: N/A    Number of Children: N/A  . Years of Education: N/A   Occupational History  . Not on file.   Social History Main Topics  . Smoking status: Never Smoker   . Smokeless tobacco: Never Used  . Alcohol Use: 1.2 oz/week    2 Glasses of wine per week     Comment: week  . Drug Use: No  . Sexually Active: Yes    Birth Control/ Protection: Surgical   Other Topics Concern  . Not on file  Social History Narrative  . No narrative on file     Review of Systems: Constitutional: Positive for fatigue. Negative for chills, fever, night sweats or weight changes HEENT: see above Cardiovascular: negative for chest pain or palpitations Respiratory: Positive for cough. Negative for hemoptysis, wheezing, or shortness of breath Abdominal: negative for abdominal pain, nausea, vomiting or diarrhea Dermatological: negative for rash Neurologic: Positive for headache. Negative for dizziness or vertigo   Physical Exam: Blood pressure 135/89, pulse 85, temperature 98.6 F (37 C), temperature source Oral, resp. rate 16, height 5' 1.5" (1.562 m), weight 140 lb 6.4 oz (63.685 kg), SpO2 99.00%., Body mass index is 26.1 kg/(m^2). General:  Well developed, well nourished, in no acute distress. Head: Normocephalic, atraumatic, eyes without discharge, sclera non-icteric, nares are congested. Bilateral auditory canals clear, TM's are without perforation, pearly grey with reflective cone of light bilaterally. Serous effusion bilaterally behind TM's. Right maxillary and frontal sinus TTP. Oral cavity moist, dentition normal. Posterior pharynx with post nasal drip and mild erythema. No peritonsillar abscess or tonsillar exudate. Neck: Supple. No thyromegaly. Full ROM. No lymphadenopathy. Lungs: Clear bilaterally to auscultation without wheezes, rales, or rhonchi. Breathing is unlabored.  Heart: RRR with S1 S2. No murmurs, rubs, or gallops appreciated. Msk:  Strength and tone normal for age. Extremities: No clubbing or cyanosis. No edema. Neuro: Alert and oriented X 3. Moves all extremities spontaneously. CNII-XII grossly in tact. Psych:  Responds to questions appropriately with a normal affect.     ASSESSMENT AND PLAN:  52 y.o. female with sinusitis and cough -Azithromycin 250 MG #6 2 po first day then 1 po next 4 days no RF -Hycodan #4oz 1 tsp po q 4-6 hours prn cough no RF SED -Mucinex -Tylenol/Motrin prn -Yogurt -Probiotics -Rest/fluids -RTC precautions -RTC 3-5 days if no improvement  Signed, Eula Listen, PA-C 10/09/2012 10:31 AM

## 2012-10-10 ENCOUNTER — Telehealth: Payer: Self-pay

## 2012-10-10 NOTE — Telephone Encounter (Signed)
LMOM for pt to call me back. Trying to do a prior auth for pt's Nasonex and need to ask pt what other medications (nasal sprays and po) pt has tried/failed  for allergies in the past.

## 2012-10-12 NOTE — Telephone Encounter (Signed)
Pt CB and reported that she has used the nasonex for several years when she gets colds and flare ups of allergies. She has tried the flonase and nasacort in the past. Pt also takes Allegra and patanol eye drops for allergies.

## 2012-10-12 NOTE — Telephone Encounter (Signed)
Called and completed prior auth over the phone and received approval for 24 mos. Faxed approval notice to pharmacy.

## 2013-02-12 ENCOUNTER — Ambulatory Visit (INDEPENDENT_AMBULATORY_CARE_PROVIDER_SITE_OTHER): Payer: Managed Care, Other (non HMO) | Admitting: Family Medicine

## 2013-02-12 ENCOUNTER — Ambulatory Visit
Admission: RE | Admit: 2013-02-12 | Discharge: 2013-02-12 | Disposition: A | Discharge status: Home / Self Care | Payer: Managed Care, Other (non HMO) | Source: Ambulatory Visit | Attending: Family Medicine | Admitting: Family Medicine

## 2013-02-12 ENCOUNTER — Other Ambulatory Visit: Payer: Self-pay | Admitting: Family Medicine

## 2013-02-12 VITALS — BP 123/82 | HR 84 | Temp 98.0°F | Resp 17 | Ht 61.5 in | Wt 140.0 lb

## 2013-02-12 DIAGNOSIS — R1011 Right upper quadrant pain: Secondary | ICD-10-CM

## 2013-02-12 LAB — COMPREHENSIVE METABOLIC PANEL
ALT: 10 U/L (ref 0–35)
AST: 11 U/L (ref 0–37)
Albumin: 4.1 g/dL (ref 3.5–5.2)
Alkaline Phosphatase: 108 U/L (ref 39–117)
BUN: 10 mg/dL (ref 6–23)
CO2: 26 mEq/L (ref 19–32)
Calcium: 9.4 mg/dL (ref 8.4–10.5)
Chloride: 101 mEq/L (ref 96–112)
Creat: 0.68 mg/dL (ref 0.50–1.10)
Glucose, Bld: 79 mg/dL (ref 70–99)
Potassium: 4.1 mEq/L (ref 3.5–5.3)
Sodium: 140 mEq/L (ref 135–145)
Total Bilirubin: 0.3 mg/dL (ref 0.3–1.2)
Total Protein: 7.2 g/dL (ref 6.0–8.3)

## 2013-02-12 LAB — POCT CBC
Granulocyte percent: 70.9 %G (ref 37–80)
HCT, POC: 40.9 % (ref 37.7–47.9)
Hemoglobin: 13.1 g/dL (ref 12.2–16.2)
Lymph, poc: 1.2 (ref 0.6–3.4)
MCH, POC: 28.6 pg (ref 27–31.2)
MCHC: 32 g/dL (ref 31.8–35.4)
MCV: 89.3 fL (ref 80–97)
MID (cbc): 0.6 (ref 0–0.9)
MPV: 8.1 fL (ref 0–99.8)
POC Granulocyte: 4.5 (ref 2–6.9)
POC LYMPH PERCENT: 19.5 %L (ref 10–50)
POC MID %: 9.6 %M (ref 0–12)
Platelet Count, POC: 219 10*3/uL (ref 142–424)
RBC: 4.58 M/uL (ref 4.04–5.48)
RDW, POC: 12.6 %
WBC: 6.4 10*3/uL (ref 4.6–10.2)

## 2013-02-12 LAB — LIPASE: Lipase: 15 U/L (ref 0–75)

## 2013-02-12 NOTE — Patient Instructions (Addendum)
  You may go to [facility] for your [place] at 2:30 today at [place] DO NOT EAT OR DRINK ANYTHING BETWEEN NOW AND THE U/S      Clinical Data: Abdominal pain  COMPLETE ABDOMINAL ULTRASOUND  Comparison: 05/08/2012 CT  Findings:  Gallbladder: No gallstones, gallbladder wall thickening, or  pericholecystic fluid.  Common bile duct: Measures 4 mm, within normal limits.  Liver: No focal lesion identified. Within normal limits in  parenchymal echogenicity.  IVC: Appears normal.  Pancreas: No focal abnormality seen. The main pancreatic duct is  again noted to be mildly prominent, measures upper normal on  today's exam at 2 mm.  Spleen: Measures 10 cm. No focal abnormality.  Right Kidney: Measures 10.2 cm. No hydronephrosis or focal  abnormality.  Left Kidney: Measures 10.5 cm. No hydronephrosis or focal  abnormality.  Abdominal aorta: No aneurysm identified.  IMPRESSION: No acute abnormality identified.  Main pancreatic duct is again noted to be mildly prominent/upper  normal in diameter. This remains nonspecific. No obstructing  lesion identified.  Original Report Authenticated By: Waneta Martins, M.D.

## 2013-02-12 NOTE — Progress Notes (Signed)
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  She developed epigastric and right upper quadrant abdominal pain last night around midnight.  Occasionally the pain radiates to the right thoracic back.  Some lightheadedness but no vomiting.  No known fever  Patient had abdominal discomfort last fall which turned out to be an ovarian cyst.  U/S results are noted below  Objective:  NAD Chest:  Clear Heart:  Reg, no murmur Abdomen:  Mildly tender RUQ with no HSM or mass Skin: no rash noted.   Clinical Data: Abdominal pain  COMPLETE ABDOMINAL ULTRASOUND  Comparison: 05/08/2012 CT  Findings:  Gallbladder: No gallstones, gallbladder wall thickening, or  pericholecystic fluid.  Common bile duct: Measures 4 mm, within normal limits.  Liver: No focal lesion identified. Within normal limits in  parenchymal echogenicity.  IVC: Appears normal.  Pancreas: No focal abnormality seen. The main pancreatic duct is  again noted to be mildly prominent, measures upper normal on  today's exam at 2 mm.  Spleen: Measures 10 cm. No focal abnormality.  Right Kidney: Measures 10.2 cm. No hydronephrosis or focal  abnormality.  Left Kidney: Measures 10.5 cm. No hydronephrosis or focal  abnormality.  Abdominal aorta: No aneurysm identified.  IMPRESSION: No acute abnormality identified.  Main pancreatic duct is again noted to be mildly prominent/upper  normal in diameter. This remains nonspecific. No obstructing  lesion identified.  Original Report Authenticated By: Waneta Martins, M.D.   Results for orders placed in visit on 02/12/13  POCT CBC      Result Value Range   WBC 6.4  4.6 - 10.2 K/uL   Lymph, poc 1.2  0.6 - 3.4   POC LYMPH PERCENT 19.5  10 - 50 %L   MID (cbc) 0.6  0 - 0.9   POC MID % 9.6  0 - 12 %M   POC Granulocyte 4.5  2 - 6.9   Granulocyte percent 70.9  37 - 80 %G   RBC 4.58  4.04 - 5.48 M/uL   Hemoglobin 13.1  12.2 - 16.2 g/dL   HCT, POC 40.9  81.1 - 47.9 %   MCV 89.3  80 -  97 fL   MCH, POC 28.6  27 - 31.2 pg   MCHC 32.0  31.8 - 35.4 g/dL   RDW, POC 91.4     Platelet Count, POC 219  142 - 424 K/uL   MPV 8.1  0 - 99.8 fL   Assessment: Patient certainly has symptoms consistent with gallbladder disease. Her ultrasound last fall was negative and so she may need a HIDA scan to further evaluate her symptoms. She does not appear to be seriously ill at the present.  Plan: Patient will get an ultrasound today. I discussed this with patient and her husband in the room. If this is negative then we will proceed with a HIDA scan. In addition I have ordered liver function tests and a lipase to further evaluate the area in question.  Signed, Sheila Oats.D.

## 2013-02-13 ENCOUNTER — Other Ambulatory Visit: Payer: Self-pay | Admitting: Radiology

## 2013-02-13 ENCOUNTER — Telehealth: Payer: Self-pay | Admitting: Radiology

## 2013-02-13 DIAGNOSIS — R1011 Right upper quadrant pain: Secondary | ICD-10-CM

## 2013-02-13 MED ORDER — TRAMADOL HCL 50 MG PO TABS: 50.0000 mg | ORAL_TABLET | Freq: Three times a day (TID) | ORAL | Status: DC | PRN

## 2013-02-13 NOTE — Telephone Encounter (Signed)
I will prescribe some ultram for patient

## 2013-02-13 NOTE — Telephone Encounter (Signed)
Patients husband wanted to know what patient can do between now and the Hida scan, it is on Monday  (Mondays are the only days hida scans are done) advised him patient is to keep her diet very bland and push fluids. If pain becomes severe she is to go to ER. Advised narcotics not indicated for abdominal pain. He did not seem to be pleased with our conversation, and is requesting call from Dr Milus Glazier.

## 2013-02-13 NOTE — Telephone Encounter (Signed)
Thanks. Will call husband to advise, and to check status.

## 2013-02-14 NOTE — Telephone Encounter (Signed)
Thank you I have called to advise you sent this in for her. Left message

## 2013-02-18 ENCOUNTER — Encounter (HOSPITAL_COMMUNITY)
Admission: RE | Admit: 2013-02-18 | Discharge: 2013-02-18 | Disposition: A | Discharge status: Home / Self Care | Payer: Managed Care, Other (non HMO) | Source: Ambulatory Visit | Attending: Family Medicine | Admitting: Family Medicine

## 2013-02-18 DIAGNOSIS — R1011 Right upper quadrant pain: Secondary | ICD-10-CM | POA: Insufficient documentation

## 2013-02-18 MED ORDER — TECHNETIUM TC 99M MEBROFENIN IV KIT
5.0000 | PACK | Freq: Once | INTRAVENOUS | Status: AC | PRN
  Administered 2013-02-18: 5 via INTRAVENOUS

## 2013-02-18 MED ORDER — SINCALIDE 5 MCG IJ SOLR: 0.0200 ug/kg | Freq: Once | INTRAMUSCULAR | Status: AC

## 2013-02-18 MED ORDER — SINCALIDE 5 MCG IJ SOLR
INTRAMUSCULAR | Status: AC
  Administered 2013-02-18: 1.23 ug via INTRAVENOUS
  Filled 2013-02-18: qty 5

## 2013-02-19 ENCOUNTER — Other Ambulatory Visit: Payer: Self-pay | Admitting: Family Medicine

## 2013-02-19 ENCOUNTER — Telehealth: Payer: Self-pay

## 2013-02-19 DIAGNOSIS — K829 Disease of gallbladder, unspecified: Secondary | ICD-10-CM

## 2013-02-19 NOTE — Telephone Encounter (Signed)
faxed

## 2013-02-19 NOTE — Telephone Encounter (Signed)
Pt's husband Kaedyn Belardo would like a copy of pt's Gery Pray Scan faxed to him at [phone]. Best# [phone] (husband/John Wendi Maya)

## 2013-02-26 ENCOUNTER — Ambulatory Visit (INDEPENDENT_AMBULATORY_CARE_PROVIDER_SITE_OTHER): Payer: Managed Care, Other (non HMO) | Admitting: General Surgery

## 2013-02-26 ENCOUNTER — Encounter (INDEPENDENT_AMBULATORY_CARE_PROVIDER_SITE_OTHER): Payer: Self-pay | Admitting: General Surgery

## 2013-02-26 VITALS — BP 110/62 | HR 80 | Temp 97.2°F | Resp 18 | Ht 61.0 in | Wt 137.8 lb

## 2013-02-26 DIAGNOSIS — R109 Unspecified abdominal pain: Secondary | ICD-10-CM

## 2013-02-26 NOTE — Progress Notes (Signed)
Subjective:     Patient ID: Yolanda [name], female   DOB: [birth year], 52 y.o.   MRN: [id number]  HPI The patient is a 52 year old female who is referred by Dr. Milus Glazier for evaluation of biliary dyskinesia. In speaking with the patient she describes a proximally a 9-10 month history of abdominal pain. She states the abdominal pain is then brought on while eating fruits and vegetables. She did not have any pain associated with any fatty foods she can tell. The patient underwent ultrasound which reveals no gallstones. She also underwent a HIDA scan which reveals an ejection fraction of 9%.  The patient and her husband our concern for possibility of Crohn's disease.  Review of Systems  Constitutional: Negative.   HENT: Negative.   Respiratory: Negative.   Cardiovascular: Negative.   Gastrointestinal: Negative.   Neurological: Negative.   All other systems reviewed and are negative.       Objective:   Physical Exam  Constitutional: She is oriented to person, place, and time. She appears well-developed and well-nourished.  HENT:  Head: Normocephalic and atraumatic.  Eyes: Conjunctivae and EOM are normal. Pupils are equal, round, and reactive to light.  Neck: Normal range of motion. Neck supple.  Cardiovascular: Normal rate, regular rhythm and normal heart sounds.   Pulmonary/Chest: Effort normal and breath sounds normal.  Abdominal: Soft. There is tenderness (Epigastric). There is no rebound.  Musculoskeletal: Normal range of motion.  Neurological: She is alert and oriented to person, place, and time.  Skin: Skin is warm and dry.       Assessment:     52 year old female with nonspecific abdominal pain, biliary  ejection fraction of 9%     Plan:     1. The patient states she is not been evaluated by a gastroenterologist, and her husband are concerned about the possibility of Crohn's disease. She would like to be admitted evaluated by a gastroenterologist for abdominal pain at this  time. I discussed with her husband and that ultimately if all workup is negative she could potentially still in her gallbladder removed secondary to potential biliary dyskinesia. Both her and her husband were satisfied with a GI referral to Dr. Arlyce Dice for further workup at this time. 2.  Will have patient follow back up after a GI workup should she require cholecystectomy.

## 2013-02-27 ENCOUNTER — Telehealth (INDEPENDENT_AMBULATORY_CARE_PROVIDER_SITE_OTHER): Payer: Self-pay | Admitting: General Surgery

## 2013-02-27 NOTE — Telephone Encounter (Signed)
Left message on voicemail that patient has appt with Dr Jarold Motto who she is already established with  On 03/06/13 3:45

## 2013-03-06 ENCOUNTER — Encounter: Payer: Self-pay | Admitting: Gastroenterology

## 2013-03-06 ENCOUNTER — Ambulatory Visit (INDEPENDENT_AMBULATORY_CARE_PROVIDER_SITE_OTHER): Payer: Managed Care, Other (non HMO) | Admitting: Gastroenterology

## 2013-03-06 VITALS — BP 114/62 | HR 79 | Ht 61.25 in | Wt 140.5 lb

## 2013-03-06 DIAGNOSIS — K219 Gastro-esophageal reflux disease without esophagitis: Secondary | ICD-10-CM

## 2013-03-06 DIAGNOSIS — R109 Unspecified abdominal pain: Secondary | ICD-10-CM

## 2013-03-06 DIAGNOSIS — K589 Irritable bowel syndrome without diarrhea: Secondary | ICD-10-CM

## 2013-03-06 DIAGNOSIS — R1011 Right upper quadrant pain: Secondary | ICD-10-CM

## 2013-03-06 MED ORDER — HYOSCYAMINE SULFATE 0.125 MG SL SUBL: 0.1250 mg | SUBLINGUAL_TABLET | SUBLINGUAL | Status: DC | PRN

## 2013-03-06 MED ORDER — MOVIPREP 100 G PO SOLR: 1.0000 | Freq: Once | ORAL | Status: DC

## 2013-03-06 NOTE — Progress Notes (Signed)
This is a 52 year old Caucasian female with intermittent right upper quadrant pain of unexplained etiology.  He been seen earlier in this year for GI evaluation and had a negative CT scan, ultrasound, but had an abnormal CCK HIDA scan.  Referral made to Dr.Ramirez surgery for consideration of cholecystectomy, he apparently wanted repeat GI referral and evaluation by Dr. Arlyce Dice.This  Is not the way things work in our GI group.  Please see my previous notes for evaluation.  She continues with every several months episodes of right upper quadrant pain described as a crampy colicky type pain with some nausea but no emesis.  This pain usually lasts several days in duration and this.he did back and is not associated with fever, chills, clinical her stools or dark urine.  There no real precipitating or alleviating elements.  She does have IBS with alternating diarrhea and constipation with some gas and bloating.  She has intolerance to high roughage foods.  She denies systemic complaints or icterus, fever chills.  She also denies reflux symptoms, melena or hematochezia, anorexia or weight loss.  Family history is noncontributory.  She does not abuse alcohol, cigarettes, or NSAIDs.  Radiographs today were reviewed.  Her evaluation by Dr. Lily Peer in gynecology is been unremarkable except for some benign ovarian cyst.  There is no history of chronic recurrent acid reflux symptoms.  Current Medications, Allergies, Past Medical History, Past Surgical History, Family History and Social History were reviewed in [employer] record.  ROS: All systems were reviewed and are negative unless otherwise stated in the HPI.          Physical Exam: Skin no acute distress.  I cannot appreciate them of chronic liver disease.  Blood pressure 114/62, pulse 79 and regular, and weight 140 the BMI of 26.32.  Chest is clear she appear to be in a regular rhythm without murmurs gallops or rubs.  There is no  organomegaly, abdominal masses or significant tenderness.  Bowel sounds are normal.  Mental status is normal.  Peripheral extremities are unremarkable.    Assessment and Plan: This patient has a markedly abnormal CCK-HIDA scan consistent with biliary dyskinesia and gallbladder dysfunction with intermittent episodes of what sounds like biliary colic.  I have scheduled her for endoscopy and colonoscopy to complete her workup, and we'll treat her with when necessary sublingual Levsin and a FOD-MAP diet for IBS.  At the time of endoscopy we'll do small bowel biopsy and also check of H. pylori.  Her symptoms do not seem consistent with inflammatory bowel disease.  I suspect she will need laparoscopic cholecystectomy, and if her workup is negative will refer her to Dr. Luretha Murphy for his evaluation No diagnosis found.

## 2013-03-06 NOTE — Patient Instructions (Addendum)
  You have been scheduled for an endoscopy and colonoscopy with propofol. Please follow the written instructions given to you at your visit today. Please pick up your prep at the pharmacy within the next 1-3 days. If you use inhalers (even only as needed), please bring them with you on the day of your procedure. Your physician has requested that you go to www.startemmi.com and enter the access code given to you at your visit today. This web site gives a general overview about your procedure. However, you should still follow specific instructions given to you by our office regarding your preparation for the procedure.  We have sent the following medications to your pharmacy for you to pick up at your convenience: Levsin, please take as directed   FOD MAP Diet given for your review. _______________________________________________________                                               We are excited to introduce MyChart, a new best-in-class service that provides you online access to important information in your electronic medical record. We want to make it easier for you to view your health information - all in one secure location - when and where you need it. We expect MyChart will enhance the quality of care and service we provide.  When you register for MyChart, you can:    View your test results.    Request appointments and receive appointment reminders via email.    Request medication renewals.    View your medical history, allergies, medications and immunizations.    Communicate with your physician's office through a password-protected site.    Conveniently print information such as your medication lists.  To find out if MyChart is right for you, please talk to a member of our clinical staff today. We will gladly answer your questions about this free health and wellness tool.  If you are age 50 or older and want a member of your family to have access to your record, you must provide  written consent by completing a proxy form available at our office. Please speak to our clinical staff about guidelines regarding accounts for patients younger than age 88.  As you activate your MyChart account and need any technical assistance, please call the MyChart technical support line at [phone]) 83-CHART [phone]) or email your question to [email or web address]. If you email your question(s), please include your name, a return phone number and the best time to reach you.  If you have non-urgent health-related questions, you can send a message to our office through MyChart at [email or web address]. If you have a medical emergency, call 911.  Thank you for using MyChart as your new health and wellness resource!   MyChart licensed from [employer],  [id number]. Patents Pending.

## 2013-03-11 ENCOUNTER — Encounter: Payer: Self-pay | Admitting: Gastroenterology

## 2013-03-20 ENCOUNTER — Encounter: Payer: Self-pay | Admitting: Gastroenterology

## 2013-03-20 ENCOUNTER — Ambulatory Visit (AMBULATORY_SURGERY_CENTER): Payer: Managed Care, Other (non HMO) | Admitting: Gastroenterology

## 2013-03-20 VITALS — BP 103/69 | HR 80 | Temp 98.4°F | Resp 17 | Ht 61.25 in | Wt 140.0 lb

## 2013-03-20 DIAGNOSIS — R1011 Right upper quadrant pain: Secondary | ICD-10-CM

## 2013-03-20 DIAGNOSIS — K219 Gastro-esophageal reflux disease without esophagitis: Secondary | ICD-10-CM

## 2013-03-20 DIAGNOSIS — K2980 Duodenitis without bleeding: Secondary | ICD-10-CM

## 2013-03-20 DIAGNOSIS — R198 Other specified symptoms and signs involving the digestive system and abdomen: Secondary | ICD-10-CM

## 2013-03-20 DIAGNOSIS — Z1211 Encounter for screening for malignant neoplasm of colon: Secondary | ICD-10-CM

## 2013-03-20 DIAGNOSIS — K589 Irritable bowel syndrome without diarrhea: Secondary | ICD-10-CM

## 2013-03-20 DIAGNOSIS — D1330 Benign neoplasm of unspecified part of small intestine: Secondary | ICD-10-CM

## 2013-03-20 MED ORDER — ESOMEPRAZOLE MAGNESIUM 40 MG PO CPDR: DELAYED_RELEASE_CAPSULE | ORAL | Status: DC

## 2013-03-20 MED ORDER — SODIUM CHLORIDE 0.9 % IV SOLN: 500.0000 mL | INTRAVENOUS | Status: DC

## 2013-03-20 NOTE — Op Note (Signed)
[facility] [address] [place], [address]   COLONOSCOPY PROCEDURE REPORT  PATIENT: Yolanda [name], Yolanda [name]  MR#: [id number] BIRTHDATE: November 30, 1960 , 51  yrs. old GENDER: Female ENDOSCOPIST: Mardella Layman, MD, [facility] REFERRED BY: PROCEDURE DATE:  03/20/2013 PROCEDURE:   Colonoscopy, screening ASA CLASS:   Class II INDICATIONS: MEDICATIONS: propofol (Diprivan) 200mg  IV  DESCRIPTION OF PROCEDURE:   After the risks benefits and alternatives of the procedure were thoroughly explained, informed consent was obtained.  A digital rectal exam revealed no abnormalities of the rectum.   The LB [id number]  endoscope was introduced through the anus and advanced to the cecum, which was identified by both the appendix and ileocecal valve. No adverse events experienced.   The quality of the prep was excellent, using MoviPrep  The instrument was then slowly withdrawn as the colon was fully examined.      COLON FINDINGS: A normal appearing cecum, ileocecal valve, and appendiceal orifice were identified.  The ascending, hepatic flexure, transverse, splenic flexure, descending, sigmoid colon and rectum appeared unremarkable.  No polyps or cancers were seen. Retroflexed views revealed no abnormalities. The time to cecum=4 minutes 27 seconds.  Withdrawal time=4 minutes 46 seconds.  The scope was withdrawn and the procedure completed. COMPLICATIONS: There were no complications.  ENDOSCOPIC IMPRESSION: Normal colon ...no polyps noted,tortuous and redundant colon noted,probable IBS.  RECOMMENDATIONS: 1.  Continue current medications 2.  High fiber diet with liberal fluid intake. 3.  Metamucil or benefiber 4.  Upper endoscopy will be scheduled   eSigned:  Mardella Layman, MD, [facility] 03/20/2013 4:00 PM   cc:   PATIENT NAME:  [name], [name] MR#: [id number]

## 2013-03-20 NOTE — Progress Notes (Signed)
Patient did not experience any of the following events: a burn prior to discharge; a fall within the facility; wrong site/side/patient/procedure/implant event; or a hospital transfer or hospital admission upon discharge from the facility. (G8907) Patient did not have preoperative order for IV antibiotic SSI prophylaxis. (G8918)  

## 2013-03-20 NOTE — Progress Notes (Signed)
Pt told us she had a hx of fainting.  She had to get stitches from a fall at the hospital.  I asked if I could lay her down on the stretcher.  Pt said, "No I am good now".  After pt's IV stick she said, "I don't feel well".  Pt was postioned supine with her knees bent.  She than request to go to the rest room.  Her face still looked pastey to me.  I suggested she use the bed pan.  Pt agreed, but said "I am so embarressed".  I explained this was normal, and not to worry. Maw

## 2013-03-20 NOTE — Patient Instructions (Addendum)

## 2013-03-20 NOTE — Progress Notes (Signed)
Pt had clear yellow fluid in bedpan.  Pt cleaned herself.  I gave her a clean wash cloth to clean her hands.  Hand gel to follow. Maw

## 2013-03-20 NOTE — Progress Notes (Signed)
Called to room to assist during endoscopic procedure.  Patient ID and intended procedure confirmed with present staff. Received instructions for my participation in the procedure from the performing physician.  

## 2013-03-20 NOTE — Op Note (Signed)
[facility] [address] [place], [address]   ENDOSCOPY PROCEDURE REPORT  PATIENT: Yolanda, [name]  MR#: [id number] BIRTHDATE: 11/04/1960 , 51  yrs. old GENDER: Female ENDOSCOPIST:Iylah Dworkin Hale Bogus, MD, [facility] REFERRED BY: PROCEDURE DATE:  03/20/2013 PROCEDURE:   EGD w/ biopsy and EGD w/ biopsy for H.pylori ASA CLASS:    Class II INDICATIONS: Epigastric pain. MEDICATION: There was residual sedation effect present from prior procedure and propofol (Diprivan) 100mg  IV TOPICAL ANESTHETIC:   Cetacaine Spray  DESCRIPTION OF PROCEDURE:   After the risks and benefits of the procedure were explained, informed consent was obtained.  The LB [id number]  endoscope was introduced through the mouth  and advanced to the second portion of the duodenum .  The instrument was slowly withdrawn as the mucosa was fully examined.      DUODENUM: Moderate duodenal inflammation was found in the duodenal bulb.  multiple duodenal biopsies were done.  STOMACH: The mucosa of the stomach appeared normal.  A CLO biopsy was performed.  ESOPHAGUS: A 4 cm hiatal hernia was noted.   A Schatzki's ring was found at the gastroesophageal junction.    Retroflexed views revealed a hiatal hernia. there is inflammation in the distal esophagus without erosions or ulcerations.  See pictures for documentation.   The scope was then withdrawn from the patient and the procedure completed.  COMPLICATIONS: There were no complications.   ENDOSCOPIC IMPRESSION: 1.   Duodenal inflammation was found in the duodenal bulb ..duodenitis,r/o H.pylori 2.   The mucosa of the stomach appeared normal; biopsy 3.   4 cm hiatal hernia with Grade A esophagitis 4.   Schatzki's ring was found at the gastroesophageal junction  RECOMMENDATIONS: 1.  Await pathology results 2.  PPI qam..Marland KitchenNexium 40 mg. daily 3.  Rx CLO if positive    _______________________________ eSigned:  Mardella Layman, MD, [facility]  03/20/2013 4:14 PM   standard discharge

## 2013-03-21 ENCOUNTER — Telehealth: Payer: Self-pay

## 2013-03-21 ENCOUNTER — Telehealth: Payer: Self-pay | Admitting: *Deleted

## 2013-03-21 MED ORDER — OMEPRAZOLE 40 MG PO CPDR: 40.0000 mg | DELAYED_RELEASE_CAPSULE | Freq: Every day | ORAL | Status: DC

## 2013-03-21 NOTE — Telephone Encounter (Signed)
Left a message on the pt's answering machine [phone]. Maw

## 2013-03-21 NOTE — Telephone Encounter (Signed)
Letter via fax from [facility], prior auth needed for [employer] said that patient must try Omeprazole or Pantoprazole I called patient and let her know what insurance said and asked patient if she has tried any other acid reducer medication Patient said no I advised patient that I will send in Omeprazole 40 mg, for her to take one tablet by mouth once daily  Patient verbalized understanding

## 2013-03-22 ENCOUNTER — Encounter: Payer: Self-pay | Admitting: Gastroenterology

## 2013-03-25 ENCOUNTER — Encounter: Payer: Self-pay | Admitting: Gastroenterology

## 2013-03-27 ENCOUNTER — Encounter: Payer: Self-pay | Admitting: Gynecology

## 2013-04-18 ENCOUNTER — Encounter: Payer: Self-pay | Admitting: *Deleted

## 2013-04-23 ENCOUNTER — Ambulatory Visit (INDEPENDENT_AMBULATORY_CARE_PROVIDER_SITE_OTHER): Payer: Managed Care, Other (non HMO) | Admitting: Gastroenterology

## 2013-04-23 ENCOUNTER — Encounter: Payer: Self-pay | Admitting: Gastroenterology

## 2013-04-23 VITALS — BP 100/60 | HR 88 | Ht 61.5 in | Wt 136.5 lb

## 2013-04-23 DIAGNOSIS — K828 Other specified diseases of gallbladder: Secondary | ICD-10-CM

## 2013-04-23 DIAGNOSIS — R109 Unspecified abdominal pain: Secondary | ICD-10-CM

## 2013-04-23 NOTE — Progress Notes (Signed)
This is a 52 year old Caucasian female with recurrent right upper quadrant pain with previously seen Dr. Derrell Lolling in cardiology and was referred to GI for further evaluation.  She had a negative upper abdominal ultrasound but had a positive CCK HIDA scan with a 9% ejection fraction.  Endoscopy and colonoscopy were essentially unremarkable as were exams for celiac disease and H. pylori infection.  She was placed empirically on daily PPI therapy and is been doing well until the past weekend she developed again crampy right upper quadrant pain on a scale of 6-10 without nausea and vomiting or other GI complaints.  She denies abuse of alcohol, cigarettes, or NSAIDs.  He is tried tramadol and Levsin without improvement.  Review of CT scan from September 2013 shows possible gallbladder debris  Current Medications, Allergies, Past Medical History, Past Surgical History, Family History and Social History were reviewed in [employer] record.  ROS: All systems were reviewed and are negative unless otherwise stated in the HPI.          Physical Exam: At pressure 100/60, pulse 88 and regular, and weight 136 with a BMI of 25.38.  I cannot appreciate stigmata of chronic liver disease.  Abdominal exam is unremarkable suffer some tenderness to deep palpation in the right subcostal area.  Bowel sounds otherwise normal.  Mental status is normal.    Assessment and Plan: Relates patient with recurrent right upper quadrant pain and dysfunctional gallbladder.S he needs laparoscopic cholecystectomy.  I have shown her our patient education May the concern laparoscopic cholecystectomy, and referred her back to surgery for evaluation.  She is to continue her empiric PPI therapy with when necessary Levsin and when necessary tramadol.

## 2013-04-23 NOTE — Patient Instructions (Signed)
You have been scheduled for an appointment with Dr. Harden Mo at [facility]. Your appointment is on 04-25-2013 at . Please arrive at 12 pm registration make certain   to bring a list of current medications, including any over the counter medications or vitamins. Also bring your co-pay if you have one as well as your insurance cards. [facility] is located at [address], [address]. Should you need to reschedule your appointment, please contact them at [phone].

## 2013-04-25 ENCOUNTER — Encounter (INDEPENDENT_AMBULATORY_CARE_PROVIDER_SITE_OTHER): Payer: Self-pay | Admitting: General Surgery

## 2013-04-25 ENCOUNTER — Ambulatory Visit (INDEPENDENT_AMBULATORY_CARE_PROVIDER_SITE_OTHER): Payer: Managed Care, Other (non HMO) | Admitting: General Surgery

## 2013-04-25 VITALS — BP 132/72 | HR 74 | Temp 98.0°F | Resp 18 | Ht 65.0 in | Wt 137.0 lb

## 2013-04-25 DIAGNOSIS — K828 Other specified diseases of gallbladder: Secondary | ICD-10-CM

## 2013-04-25 NOTE — Progress Notes (Signed)
Patient ID: Yolanda [name], female   DOB: 1960/08/25, 52 y.o.   MRN: [id number]  Chief Complaint  Patient presents with  . Pre-op Exam    G.B    HPI Yolanda [name] is a 52 y.o. female.  The patient is a 52 year old female with biliary dyskinesia. Patient has had a history of right upper quadrant pain and most recently this  Weekend had another attack. The patient denies any fevers or chills with these episodes. The patient hasn't seen Dr. Jarold Motto has undergone colonoscopy as well as EGD which was benign for any neoplasms. HPI  Past Medical History  Diagnosis Date  . Depression   . IC (interstitial cystitis)   . Allergy   . Gallbladder disease   . Hiatal hernia   . Esophagitis   . Schatzki's ring     Past Surgical History  Procedure Laterality Date  . Tmj arthroscopy  11-26-2001  . Hysteroscopy  12/2003    RESECTON OF SUBMUCOUS  MYOMA/DX SCOPE  . Pelvic laparoscopy  12/2003    DX SCOPE/RESECTION OF SUBMUCOUS MYOMA  . Vaginal hysterectomy  12/05    Family History  Problem Relation Age of Onset  . Diabetes Brother     HALF BROTHER   . Cancer Maternal Grandmother     UTERINE  . Breast cancer Sister     HALF SISTER (DIFFERENT MOTHER)/HALF SISTER (DIFFERENT MOTHER)/HALF SISTER-DIFFERENT MOTHER  . Diabetes Brother     HALF BROTHER  . Cystic fibrosis Other     Niece(Brother)  . Colon cancer Neg Hx   . Esophageal cancer Neg Hx   . Rectal cancer Neg Hx   . Stomach cancer Neg Hx   . Emphysema Father     Social History History  Substance Use Topics  . Smoking status: Never Smoker   . Smokeless tobacco: Never Used  . Alcohol Use: 1.2 oz/week    2 Glasses of wine per week     Comment: week    No Known Allergies  Current Outpatient Prescriptions  Medication Sig Dispense Refill  . BuPROPion HCl (WELLBUTRIN PO) Take by mouth.        . fexofenadine (ALLEGRA) 30 MG tablet Take 30 mg by mouth 2 (two) times daily as needed.       . hyoscyamine (LEVSIN SL) 0.125 MG SL tablet  Place 1 tablet (0.125 mg total) under the tongue every 4 (four) hours as needed for cramping.  30 tablet  0  . LORazepam (ATIVAN) 0.5 MG tablet Take 0.5 mg by mouth as needed.      . Multiple Vitamin (MULTIVITAMIN) tablet Take 1 tablet by mouth daily.        Marland Kitchen omeprazole (PRILOSEC) 40 MG capsule Take 1 capsule (40 mg total) by mouth daily.  90 capsule  3  . mometasone (NASONEX) 50 MCG/ACT nasal spray Place 2 sprays into the nose as needed.       No current facility-administered medications for this visit.    Review of Systems Review of Systems  Constitutional: Negative.   HENT: Negative.   Respiratory: Negative.   Cardiovascular: Negative.   Gastrointestinal: Positive for abdominal pain.  Musculoskeletal: Negative.   All other systems reviewed and are negative.    Blood pressure 132/72, pulse 74, temperature 98 F (36.7 C), resp. rate 18, height 5\' 5"  (1.651 m), weight 137 lb (62.143 kg).  Physical Exam Physical Exam  Constitutional: She is oriented to person, place, and time. She appears well-developed and well-nourished.  HENT:  Head: Normocephalic and atraumatic.  Eyes: Conjunctivae and EOM are normal. Pupils are equal, round, and reactive to light.  Neck: Normal range of motion. Neck supple.  Cardiovascular: Normal rate, regular rhythm and normal heart sounds.   Pulmonary/Chest: Effort normal and breath sounds normal.  Abdominal: Soft. Bowel sounds are normal. She exhibits no distension. There is no tenderness. There is no rebound and no guarding.  Musculoskeletal: Normal range of motion.  Neurological: She is alert and oriented to person, place, and time.  Skin: Skin is warm and dry.    Data Reviewed HIDA Scan revealed an ejection fraction of 9%.  Assessment    52 year old female with biliary dyskinesia     Plan    1. We'll proceed to the operating room for laparoscopic cholecystectomy no IOC 2.All risks and benefits were discussed with the patient to generally  include: infection, bleeding, possible need for post op ERCP, damage to the bile ducts, and bile leak. Alternatives were offered and described.  All questions were answered and the patient voiced understanding of the procedure and wishes to proceed at this point with a laparoscopic cholecystectomy         Marigene Ehlers., [facility] 04/25/2013, 12:31 PM

## 2013-05-06 ENCOUNTER — Other Ambulatory Visit (INDEPENDENT_AMBULATORY_CARE_PROVIDER_SITE_OTHER): Payer: Self-pay | Admitting: *Deleted

## 2013-05-06 ENCOUNTER — Other Ambulatory Visit (INDEPENDENT_AMBULATORY_CARE_PROVIDER_SITE_OTHER): Payer: Self-pay | Admitting: General Surgery

## 2013-05-06 DIAGNOSIS — K811 Chronic cholecystitis: Secondary | ICD-10-CM

## 2013-05-06 MED ORDER — OXYCODONE-ACETAMINOPHEN 5-325 MG PO TABS: 1.0000 | ORAL_TABLET | ORAL | Status: DC | PRN

## 2013-05-23 ENCOUNTER — Encounter (INDEPENDENT_AMBULATORY_CARE_PROVIDER_SITE_OTHER): Payer: Self-pay | Admitting: General Surgery

## 2013-05-23 ENCOUNTER — Ambulatory Visit (INDEPENDENT_AMBULATORY_CARE_PROVIDER_SITE_OTHER): Payer: Managed Care, Other (non HMO) | Admitting: General Surgery

## 2013-05-23 VITALS — BP 102/70 | HR 72 | Resp 14 | Ht 61.5 in | Wt 136.2 lb

## 2013-05-23 DIAGNOSIS — Z9049 Acquired absence of other specified parts of digestive tract: Secondary | ICD-10-CM

## 2013-05-23 DIAGNOSIS — Z9889 Other specified postprocedural states: Secondary | ICD-10-CM

## 2013-05-23 NOTE — Progress Notes (Signed)
Patient ID: Raedyn Klinck, female   DOB: 03/21/1961, 52 y.o.   MRN: [id number] The patient is a 52 year old female status post laparoscopic cholecystectomy. The patient had no issues postoperatively. She's had some loose bowel movements but states are getting more normal. She does complain of some left wrist/hand pain which is the same hand at the ID for the operation. She states she began noticing this pain to 3 days after the surgery. She's had no fevers while at home or noticed any erythema.  Pathology: Reveals chronic cholecystitis, this was discussed with the patient.  On exam:  wounds are clean dry and intact There is no erythema to the left hand no palpable cord could be palpated  Assessment and plan: 52 year old female status post laparoscopic cholecystectomy 1. Patient follow up as needed 2. I discussed with her to place warm compresses left hand and take NSAIDs. Should this not resolve the cheek and follow up with her PCP for possible  Further workup.

## 2013-06-27 ENCOUNTER — Other Ambulatory Visit: Payer: Self-pay

## 2013-07-15 ENCOUNTER — Ambulatory Visit (INDEPENDENT_AMBULATORY_CARE_PROVIDER_SITE_OTHER): Payer: Managed Care, Other (non HMO) | Admitting: Gynecology

## 2013-07-15 ENCOUNTER — Encounter: Payer: Self-pay | Admitting: Gynecology

## 2013-07-15 VITALS — BP 120/74 | Ht 61.0 in | Wt 140.0 lb

## 2013-07-15 DIAGNOSIS — Z01419 Encounter for gynecological examination (general) (routine) without abnormal findings: Secondary | ICD-10-CM

## 2013-07-15 NOTE — Progress Notes (Signed)
Yolanda [name] 1960-11-23 [id number]        52 y.o.  [id number] for annual exam.  Doing well without complaints.  Past medical history,surgical history, problem list, medications, allergies, family history and social history were all reviewed and documented in the EPIC chart.  ROS:  Performed and pertinent positives and negatives are included in the history, assessment and plan .  Exam: Kim assistant Filed Vitals:   07/15/13 1103  BP: 120/74  Height: 5\' 1"  (1.549 m)  Weight: 140 lb (63.504 kg)   General appearance  Normal Skin grossly normal Head/Neck normal with no cervical or supraclavicular adenopathy thyroid normal Lungs  clear Cardiac RR, without RMG Abdominal  soft, nontender, without masses, organomegaly or hernia Breasts  examined lying and sitting without masses, retractions, discharge or axillary adenopathy. Pelvic  Ext/BUS/vagina  normal  Adnexa  Without masses or tenderness    Anus and perineum  normal   Rectovaginal  normal sphincter tone without palpated masses or tenderness.    Assessment/Plan:  52 y.o. [id number] female for annual exam.   1. Status post TVH 2005. Doing well without significant hot flashes night sweats vaginal dryness or dyspareunia. We'll continue to monitor. 2. Pap smear 2011. No Pap smear done today. Status post hysterectomy for benign indications. Reviewed current screening guidelines we both agree him stop screening in patients comfortable with this. 3. Mammography 09/2012. Continue with annual mammography. SBE monthly reviewed. 4. Colonoscopy 2014. Repeat at their recommended interval. 5. DEXA. We'll plan further into the menopause. Increase calcium vitamin D reviewed. 6. Health maintenance. No blood work done as it is done through work. Had recent comprehensive metabolic panel and CBC normal when being worked up for her cholecystectomy. Followup one year, sooner as needed.   Note: This document was prepared with digital dictation and possible smart  phrase technology. Any transcriptional errors that result from this process are unintentional.   Dara Lords MD, 11:27 AM 07/15/2013

## 2013-07-15 NOTE — Patient Instructions (Signed)
Follow up in one year, sooner as needed. 

## 2013-07-16 LAB — URINALYSIS W MICROSCOPIC + REFLEX CULTURE
Bacteria, UA: NONE SEEN
Bilirubin Urine: NEGATIVE
Casts: NONE SEEN
Crystals: NONE SEEN
Glucose, UA: NEGATIVE mg/dL
Ketones, ur: NEGATIVE mg/dL
Specific Gravity, Urine: 1.017 (ref 1.005–1.030)
Squamous Epithelial / LPF: NONE SEEN
Urobilinogen, UA: 0.2 mg/dL (ref 0.0–1.0)
pH: 6 (ref 5.0–8.0)

## 2013-08-16 ENCOUNTER — Ambulatory Visit (INDEPENDENT_AMBULATORY_CARE_PROVIDER_SITE_OTHER): Payer: Managed Care, Other (non HMO) | Admitting: *Deleted

## 2013-08-16 DIAGNOSIS — Z23 Encounter for immunization: Secondary | ICD-10-CM

## 2013-10-01 ENCOUNTER — Other Ambulatory Visit: Payer: Self-pay | Admitting: Family Medicine

## 2013-10-04 ENCOUNTER — Other Ambulatory Visit: Payer: Self-pay | Admitting: Family Medicine

## 2013-10-04 ENCOUNTER — Encounter: Payer: Self-pay | Admitting: Gynecology

## 2014-02-26 ENCOUNTER — Ambulatory Visit (INDEPENDENT_AMBULATORY_CARE_PROVIDER_SITE_OTHER): Payer: Managed Care, Other (non HMO) | Admitting: Gynecology

## 2014-02-26 ENCOUNTER — Encounter: Payer: Self-pay | Admitting: Gynecology

## 2014-02-26 DIAGNOSIS — R350 Frequency of micturition: Secondary | ICD-10-CM

## 2014-02-26 DIAGNOSIS — N3000 Acute cystitis without hematuria: Secondary | ICD-10-CM

## 2014-02-26 LAB — URINALYSIS W MICROSCOPIC + REFLEX CULTURE
Bilirubin Urine: NEGATIVE
Casts: NONE SEEN
Crystals: NONE SEEN
GLUCOSE, UA: 100 mg/dL — AB
Hgb urine dipstick: NEGATIVE
NITRITE: POSITIVE — AB
PH: 5 (ref 5.0–8.0)
RBC / HPF: NONE SEEN RBC/hpf (ref <3)
Specific Gravity, Urine: 1.015 (ref 1.005–1.030)
Urobilinogen, UA: 1 mg/dL (ref 0.0–1.0)

## 2014-02-26 MED ORDER — SULFAMETHOXAZOLE-TRIMETHOPRIM 800-160 MG PO TABS: 1.0000 | ORAL_TABLET | Freq: Two times a day (BID) | ORAL | Status: DC

## 2014-02-26 MED ORDER — PHENAZOPYRIDINE HCL 200 MG PO TABS: 200.0000 mg | ORAL_TABLET | Freq: Three times a day (TID) | ORAL | Status: DC | PRN

## 2014-02-26 NOTE — Patient Instructions (Signed)
Take antibiotic pill twice daily for 3 days. Call if her symptoms persist, worsen or recur. Take the Pyridium medication 3 times daily as needed to make a bladder feel better.  Urinary Tract Infection Urinary tract infections (UTIs) can develop anywhere along your urinary tract. Your urinary tract is your body's drainage system for removing wastes and extra water. Your urinary tract includes two kidneys, two ureters, a bladder, and a urethra. Your kidneys are a pair of bean-shaped organs. Each kidney is about the size of your fist. They are located below your ribs, one on each side of your spine. CAUSES Infections are caused by microbes, which are microscopic organisms, including fungi, viruses, and bacteria. These organisms are so small that they can only be seen through a microscope. Bacteria are the microbes that most commonly cause UTIs. SYMPTOMS  Symptoms of UTIs may vary by age and gender of the patient and by the location of the infection. Symptoms in young women typically include a frequent and intense urge to urinate and a painful, burning feeling in the bladder or urethra during urination. Older women and men are more likely to be tired, shaky, and weak and have muscle aches and abdominal pain. A fever may mean the infection is in your kidneys. Other symptoms of a kidney infection include pain in your back or sides below the ribs, nausea, and vomiting. DIAGNOSIS To diagnose a UTI, your caregiver will ask you about your symptoms. Your caregiver also will ask to provide a urine sample. The urine sample will be tested for bacteria and white blood cells. White blood cells are made by your body to help fight infection. TREATMENT  Typically, UTIs can be treated with medication. Because most UTIs are caused by a bacterial infection, they usually can be treated with the use of antibiotics. The choice of antibiotic and length of treatment depend on your symptoms and the type of bacteria causing your  infection. HOME CARE INSTRUCTIONS  If you were prescribed antibiotics, take them exactly as your caregiver instructs you. Finish the medication even if you feel better after you have only taken some of the medication.  Drink enough water and fluids to keep your urine clear or pale yellow.  Avoid caffeine, tea, and carbonated beverages. They tend to irritate your bladder.  Empty your bladder often. Avoid holding urine for long periods of time.  Empty your bladder before and after sexual intercourse.  After a bowel movement, women should cleanse from front to back. Use each tissue only once. SEEK MEDICAL CARE IF:   You have back pain.  You develop a fever.  Your symptoms do not begin to resolve within 3 days. SEEK IMMEDIATE MEDICAL CARE IF:   You have severe back pain or lower abdominal pain.  You develop chills.  You have nausea or vomiting.  You have continued burning or discomfort with urination. MAKE SURE YOU:   Understand these instructions.  Will watch your condition.  Will get help right away if you are not doing well or get worse. Document Released: 05/18/2005 Document Revised: 02/07/2012 Document Reviewed: 09/16/2011 [facility] Patient Information 2015 [place], [place]. This information is not intended to replace advice given to you by your health care provider. Make sure you discuss any questions you have with your health care provider.

## 2014-02-26 NOTE — Progress Notes (Signed)
Yolanda [name] [birth year] [id number]        53 y.o.  [id number] presents with the onset of urinary frequency and suprapubic pressure starting last night. No fever chills or low back pain. No real dysuria or urgency. History of UTIs in the past.  Past medical history,surgical history, problem list, medications, allergies, family history and social history were all reviewed and documented in the EPIC chart.  Directed ROS with pertinent positives and negatives documented in the history of present illness/assessment and plan.  Exam: General appearance:  Normal Spine: Straight without deformity or CVA tenderness Abdomen: Soft nontender without masses guarding rebound organomegaly  Assessment/Plan:  53 y.o. V6P2244 with symptoms and urinalysis consistent with acute UTI. Treat with Septra DS 1 by mouth twice a day x3 days Pyridium 200 mg 3 times a day as needed #10  Followup if symptoms persist, worsen or recur.   Note: This document was prepared with digital dictation and possible smart phrase technology. Any transcriptional errors that result from this process are unintentional.   [name], 11:15 AM 02/26/2014

## 2014-02-28 LAB — URINE CULTURE
Colony Count: NO GROWTH
ORGANISM ID, BACTERIA: NO GROWTH

## 2014-03-28 ENCOUNTER — Encounter: Payer: Self-pay | Admitting: Gynecology

## 2014-03-28 ENCOUNTER — Ambulatory Visit (INDEPENDENT_AMBULATORY_CARE_PROVIDER_SITE_OTHER): Payer: Managed Care, Other (non HMO) | Admitting: Gynecology

## 2014-03-28 DIAGNOSIS — R102 Pelvic and perineal pain: Secondary | ICD-10-CM

## 2014-03-28 DIAGNOSIS — N949 Unspecified condition associated with female genital organs and menstrual cycle: Secondary | ICD-10-CM

## 2014-03-28 LAB — URINALYSIS W MICROSCOPIC + REFLEX CULTURE
BILIRUBIN URINE: NEGATIVE
GLUCOSE, UA: NEGATIVE mg/dL
HGB URINE DIPSTICK: NEGATIVE
Ketones, ur: NEGATIVE mg/dL
Leukocytes, UA: NEGATIVE
Nitrite: NEGATIVE
PROTEIN: NEGATIVE mg/dL
Specific Gravity, Urine: 1.015 (ref 1.005–1.030)
Urobilinogen, UA: 0.2 mg/dL (ref 0.0–1.0)
pH: 5.5 (ref 5.0–8.0)

## 2014-03-28 MED ORDER — IBUPROFEN 800 MG PO TABS: 800.0000 mg | ORAL_TABLET | Freq: Three times a day (TID) | ORAL | Status: DC | PRN

## 2014-03-28 NOTE — Progress Notes (Signed)
Yolanda [name] 01-15-1961 [id number]        53 y.o.  [id number] presents with 2 day history of suprapubic pelvic pain. Relatively acute in onset with aching to sharp stabbing discomfort. No nausea vomiting diarrhea constipation. No frequency dysuria or urgency. No radiation of the pain. Patient states it feels like she's having an ovarian cyst she has had in the past. She is status post TVH.  Past medical history,surgical history, problem list, medications, allergies, family history and social history were all reviewed and documented in the EPIC chart.  Directed ROS with pertinent positives and negatives documented in the history of present illness/assessment and plan.  Exam: Kim assistant General appearance:  Normal Spine straight without CVA tenderness Abdomen soft with mild tenderness suprapubically to deep palpation. No masses guarding or rebound. Pelvic external BUS vagina normal. Bimanual with fullness left vaginal cuff consistent with ovarian cyst. Rectal exam is normal.  Assessment/Plan:  53 y.o. E3O1224 with two-day history of pain. Probable left ovarian cyst. Urinalysis is negative. Start with ibuprofen 800 mg 3 times a day #30 refill x1 and pelvic ultrasound. ASAP call precautions reviewed.   Note: This document was prepared with digital dictation and possible smart phrase technology. Any transcriptional errors that result from this process are unintentional.   [name], 9:33 AM 03/28/2014

## 2014-03-28 NOTE — Patient Instructions (Signed)
Followup for ultrasound as scheduled. Start on the ibuprofen 800 mg 3 times daily for pain. Followup sooner if pain significantly increases.

## 2014-04-09 ENCOUNTER — Ambulatory Visit (INDEPENDENT_AMBULATORY_CARE_PROVIDER_SITE_OTHER): Payer: Managed Care, Other (non HMO) | Admitting: Gynecology

## 2014-04-09 ENCOUNTER — Encounter: Payer: Self-pay | Admitting: Gynecology

## 2014-04-09 ENCOUNTER — Other Ambulatory Visit: Payer: Self-pay | Admitting: Gynecology

## 2014-04-09 ENCOUNTER — Ambulatory Visit (INDEPENDENT_AMBULATORY_CARE_PROVIDER_SITE_OTHER): Payer: Managed Care, Other (non HMO)

## 2014-04-09 DIAGNOSIS — N839 Noninflammatory disorder of ovary, fallopian tube and broad ligament, unspecified: Secondary | ICD-10-CM

## 2014-04-09 DIAGNOSIS — N83209 Unspecified ovarian cyst, unspecified side: Secondary | ICD-10-CM

## 2014-04-09 DIAGNOSIS — N949 Unspecified condition associated with female genital organs and menstrual cycle: Secondary | ICD-10-CM

## 2014-04-09 DIAGNOSIS — N838 Other noninflammatory disorders of ovary, fallopian tube and broad ligament: Secondary | ICD-10-CM

## 2014-04-09 DIAGNOSIS — R102 Pelvic and perineal pain: Secondary | ICD-10-CM

## 2014-04-09 NOTE — Patient Instructions (Signed)
Followup in 2 months for repeat ultrasound. Sooner if your pelvic pain worsens.

## 2014-04-09 NOTE — Progress Notes (Signed)
Yolanda [name] [birth year] [id number]        53 y.o.  G2P2002 presents for ultrasound. Was evaluated 03/28/2014 with two-day history of pelvic pain. Exam showed a fullness at the left vaginal cuff. Patient notes that her pain is better now.  Past medical history,surgical history, problem list, medications, allergies, family history and social history were all reviewed and documented in the EPIC chart.  Directed ROS with pertinent positives and negatives documented in the history of present illness/assessment and plan.  Ultrasound shows a normal right ovary. Left ovary with thin-walled echo-free cyst 27 mm mean and 22 mm. Left adnexa with thin-walled solid cystic area of diffuse low level echoes 29 mm mean. Cul-de-sac negative.  Assessment/Plan:  53 y.o. [id number] with ultrasound showing small benign-appearing simple left ovarian cysts and a separate area consistent with a hemorrhagic cyst. I suspect she bled into this area which was the acute pain which now is organizing and resolving and she is feeling better. I did review other possibilities to include endometriomas and tumors. Recommend followup ultrasound in 2 months with various scenarios reviewed. Assuming this area resolves and will follow. If persists or enlarging then possible laparoscopy. Patient is comfortable with observation at this time. She will followup if the pain returns.  Note: This document was prepared with digital dictation and possible smart phrase technology. Any transcriptional errors that result from this process are unintentional.   [name], 3:25 PM 04/09/2014    Presents for ultrasound.

## 2014-05-13 ENCOUNTER — Ambulatory Visit (INDEPENDENT_AMBULATORY_CARE_PROVIDER_SITE_OTHER): Payer: Managed Care, Other (non HMO) | Admitting: Family Medicine

## 2014-05-13 ENCOUNTER — Ambulatory Visit (INDEPENDENT_AMBULATORY_CARE_PROVIDER_SITE_OTHER): Payer: Managed Care, Other (non HMO)

## 2014-05-13 VITALS — BP 120/80 | HR 98 | Temp 98.2°F | Resp 16 | Ht 61.5 in | Wt 137.0 lb

## 2014-05-13 DIAGNOSIS — M79671 Pain in right foot: Secondary | ICD-10-CM

## 2014-05-13 DIAGNOSIS — M79609 Pain in unspecified limb: Secondary | ICD-10-CM

## 2014-05-13 NOTE — Patient Instructions (Addendum)
  Return in 3 weeks for follow up x-ray.  Use your cam walker.   Metatarsal Fracture, Undisplaced A metatarsal fracture is a break in the bone(s) of the foot. These are the bones of the foot that connect your toes to the bones of the ankle. DIAGNOSIS  The diagnoses of these fractures are usually made with X-rays. If there are problems in the forefoot and x-rays are normal a later bone scan will usually make the diagnosis.  TREATMENT AND HOME CARE INSTRUCTIONS  Treatment may or may not include a cast or walking shoe. When casts are needed the use is usually for short periods of time so as not to slow down healing with muscle wasting (atrophy).  Activities should be stopped until further advised by your caregiver.  Wear shoes with adequate shock absorbing capabilities and stiff soles.  Alternative exercise may be undertaken while waiting for healing. These may include bicycling and swimming, or as your caregiver suggests.  It is important to keep all follow-up visits or specialty referrals. The failure to keep these appointments could result in improper bone healing and chronic pain or disability.  Warning: Do not drive a car or operate a motor vehicle until your caregiver specifically tells you it is safe to do so. IF YOU DO NOT HAVE A CAST OR SPLINT:  You may walk on your injured foot as tolerated or advised.  Do not put any weight on your injured foot for as long as directed by your caregiver. Slowly increase the amount of time you walk on the foot as the pain allows or as advised.  Use crutches until you can bear weight without pain. A gradual increase in weight bearing may help.  Apply ice to the injury for 15-20 minutes each hour while awake for the first 2 days. Put the ice in a plastic bag and place a towel between the bag of ice and your skin.  Only take over-the-counter or prescription medicines for pain, discomfort, or fever as directed by your caregiver. SEEK IMMEDIATE  MEDICAL CARE IF:   Your cast gets damaged or breaks.  You have continued severe pain or more swelling than you did before the cast was put on, or the pain is not controlled with medications.  Your skin or nails below the injury turn blue or grey, or feel cold or numb.  There is a bad smell, or new stains or pus-like (purulent) drainage coming from the cast. MAKE SURE YOU:   Understand these instructions.  Will watch your condition.  Will get help right away if you are not doing well or get worse. Document Released: 04/30/2002 Document Revised: 10/31/2011 Document Reviewed: 03/21/2008 [facility] Patient Information 2015 [place], [place]. This information is not intended to replace advice given to you by your health care provider. Make sure you discuss any questions you have with your health care provider.

## 2014-05-13 NOTE — Progress Notes (Signed)
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 She initially had some ecchymosis over the lateral dorsal foot close to the metatarsal junction with the phalange ease. This is dated although the pain has continued.  Objective: No acute distress Mildly swollen right forefoot A feeding ecchymosis at the heads of the third and fourth metatarsals. Mildly tender had some metatarsals of 34 and 5.  [facility] reading (PRIMARY) by  Dr. Joseph Art.  Right foot fifth metatarsal fracture, nondisplaced  Assessment: Right foot fracture, and given the fact that there's been no displacement over the last 2 weeks and she injured the foot, patient should be able to use her own Cam Walker for the next 3 weeks as well as completely heals. No need for orthopedic referral  Plan: Return in 3 weeks for followup film., use Cam Walker.  Patient has pain medicine for her ovarian cyst already. , Robyn Haber, MD

## 2014-06-03 ENCOUNTER — Ambulatory Visit (INDEPENDENT_AMBULATORY_CARE_PROVIDER_SITE_OTHER): Payer: Managed Care, Other (non HMO)

## 2014-06-03 ENCOUNTER — Ambulatory Visit (INDEPENDENT_AMBULATORY_CARE_PROVIDER_SITE_OTHER): Payer: Managed Care, Other (non HMO) | Admitting: Family Medicine

## 2014-06-03 VITALS — BP 110/78 | HR 93 | Temp 98.5°F | Resp 12 | Ht 61.5 in | Wt 137.0 lb

## 2014-06-03 DIAGNOSIS — S92901D Unspecified fracture of right foot, subsequent encounter for fracture with routine healing: Secondary | ICD-10-CM

## 2014-06-03 NOTE — Progress Notes (Signed)
This chart was scribed for Yolanda Haber, MD by Terressa Koyanagi, ED Scribe at [facility]. This patient was seen in room Room 1 and the patient's care was started at 8:28 AM.  Patient ID: Yolanda [name] MRN: [id number], DOB: [birth year], 53 y.o. Date of Encounter: 06/03/2014, 8:28 AM  Primary Physician: Christinia Gully, MD  Chief Complaint  Patient presents with  . 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 Pt has been using her cam walker as instructed, however, notes she continues to experience discomofort in her right foot. Pt reports prior to the onset of her right foot pain she walked 3 miles daily. However, since the onset of her right foot pain she has not been able to keep up with her daily exercise. Pt denies taking vitamin D, however, states she does drink milk regularly and takes a daily multivitamin.     Past Medical History  Diagnosis Date  . Depression   . IC (interstitial cystitis)   . Allergy   . Gallbladder disease   . Hiatal hernia   . Esophagitis   . Schatzki's ring      Home Meds: Prior to Admission medications   Medication Sig Start Date End Date Taking? Authorizing Provider  BuPROPion HCl (WELLBUTRIN PO) Take 150 mg by mouth.    Yes Historical Provider, MD  fexofenadine (ALLEGRA) 30 MG tablet Take 30 mg by mouth 2 (two) times daily as needed.    Yes Historical Provider, MD  ibuprofen (ADVIL,MOTRIN) 800 MG tablet Take 1 tablet (800 mg total) by mouth every 8 (eight) hours as needed. 03/28/14  Yes Anastasio Auerbach, MD  LORazepam (ATIVAN) 0.5 MG tablet Take 0.5 mg by mouth as needed.   Yes Historical Provider, MD  mometasone (NASONEX) 50 MCG/ACT nasal spray Place 2 sprays into the nose daily. PATIENT NEEDS OFFICE VISIT FOR ADDITIONAL REFILLS   Yes Collene Leyden, PA-C  Multiple Vitamin (MULTIVITAMIN) tablet Take 1 tablet by mouth daily.      Yes Historical Provider, MD  olopatadine (PATANOL) 0.1 % ophthalmic solution Place 1 drop into both eyes 2 (two) times daily. PATIENT NEEDS OFFICE VISIT FOR ADDITIONAL REFILLS   Yes Heather M Marte, PA-C  mometasone (NASONEX) 50 MCG/ACT nasal spray Place 2 sprays into the nose as needed. 12/07/11 03/06/13  Darreld Mclean, MD    Allergies: No Known Allergies  History   Social History  . Marital Status: Married    Spouse Name: N/A    Number of Children: N/A  . Years of Education: N/A   Occupational History  . Not on file.   Social History Main Topics  . Smoking status: Never Smoker   . Smokeless tobacco: Never Used  . Alcohol Use: 1.2 oz/week    2 Glasses of wine per week     Comment: week  . Drug Use: No  . Sexual Activity: Yes    Birth Control/ Protection: Surgical     Comment: HYST   Other Topics Concern  . Not on file   Social History Narrative  . No narrative on file     Review of Systems: Constitutional: negative for chills, fever, night sweats, weight changes, or fatigue  HEENT: negative for vision changes, hearing loss, congestion, rhinorrhea, ST, epistaxis, or sinus pressure Cardiovascular: negative for chest pain or palpitations Respiratory: negative  for hemoptysis, wheezing, shortness of breath, or cough Abdominal: negative for abdominal pain, nausea, vomiting, diarrhea, or constipation Dermatological: negative for rash Neurologic: negative for headache, dizziness, or syncope Musc: Positive for right foot pain All other systems reviewed and are otherwise negative with the exception to those above and in the HPI.   Physical Exam: Blood pressure 110/78, pulse 93, temperature 98.5 F (36.9 C), temperature source Oral, resp. rate 12, height 5' 1.5" (1.562 m), weight 137 lb (62.143 kg), SpO2 99.00%., Body mass index is 25.47 kg/(m^2). General: Well developed, well nourished, in no acute distress. Head: Normocephalic, atraumatic, eyes without discharge, sclera  non-icteric, nares are without discharge. Bilateral auditory canals clear, TM's are without perforation, pearly grey and translucent with reflective cone of light bilaterally. Oral cavity moist, posterior pharynx without exudate, erythema, peritonsillar abscess, or post nasal drip.  i Msk:  Strength and tone normal for age.  nontender nonswollen right foot Extremities/Skin: Warm and dry. No clubbing or cyanosis. No edema. No rashes or suspicious lesions. Neuro: Alert and oriented X 3. Moves all extremities spontaneously. Gait is normal. CNII-XII grossly in tact. Psych:  Responds to questions appropriately with a normal affect.   Labs:[facility] reading (PRIMARY) by  Dr. Joseph Art:  Callus formation noted.  Good alignment.    ASSESSMENT AND PLAN:  May resume ADL's without boot Gradual increase in walking over several weeks to get to 3 miles per day  COORDINATION OF CARE: 8:32 AM-Discussed treatment plan which includes imaging with pt at bedside and pt agreed to plan.   53 y.o. year old female with    Signed, Yolanda Haber, MD 06/03/2014 8:28 AM

## 2014-06-11 ENCOUNTER — Other Ambulatory Visit: Payer: Self-pay | Admitting: Gynecology

## 2014-06-11 ENCOUNTER — Ambulatory Visit (INDEPENDENT_AMBULATORY_CARE_PROVIDER_SITE_OTHER): Payer: Managed Care, Other (non HMO)

## 2014-06-11 ENCOUNTER — Ambulatory Visit (INDEPENDENT_AMBULATORY_CARE_PROVIDER_SITE_OTHER): Payer: Managed Care, Other (non HMO) | Admitting: Gynecology

## 2014-06-11 ENCOUNTER — Encounter: Payer: Self-pay | Admitting: Gynecology

## 2014-06-11 DIAGNOSIS — R3915 Urgency of urination: Secondary | ICD-10-CM

## 2014-06-11 DIAGNOSIS — R102 Pelvic and perineal pain: Secondary | ICD-10-CM

## 2014-06-11 DIAGNOSIS — N83209 Unspecified ovarian cyst, unspecified side: Secondary | ICD-10-CM

## 2014-06-11 DIAGNOSIS — N83202 Unspecified ovarian cyst, left side: Secondary | ICD-10-CM

## 2014-06-11 DIAGNOSIS — S92901A Unspecified fracture of right foot, initial encounter for closed fracture: Secondary | ICD-10-CM

## 2014-06-11 DIAGNOSIS — N8320 Unspecified ovarian cysts: Secondary | ICD-10-CM

## 2014-06-11 DIAGNOSIS — Z23 Encounter for immunization: Secondary | ICD-10-CM

## 2014-06-11 NOTE — Addendum Note (Signed)
Addended by: Nelva Nay on: 06/11/2014 12:44 PM   Modules accepted: Orders

## 2014-06-11 NOTE — Patient Instructions (Signed)
Office will contact you to arrange surgery and her annual exam. Follow up for bone density as scheduled.

## 2014-06-11 NOTE — Progress Notes (Signed)
MIRACLE MONGILLO 1961/05/29 [id number]        53 y.o.  G2P2002 presents for follow up ultrasound. History of onset of pelvic pain in July relatively acute. Also notes deep dyspareunia on a consistent basis. Had ultrasound 04/09/2014 which showed 2 left ovarian cysts 27 and 22 mm mean and a separate left adnexal cystic area 29 mm. They appeared to be hemorrhagic with a possibility of endometriomas. We treated her expectantly with pain medication and follow up ultrasound scheduled now. Patient notes her pain has persisted on and off and again is having consistent the dyspareunia. She also notes now after the ultrasound she is having a fair amount of pain on her left side.  Past medical history,surgical history, problem list, medications, allergies, family history and social history were all reviewed and documented in the EPIC chart.  Directed ROS with pertinent positives and negatives documented in the history of present illness/assessment and plan.  Ultrasound shows right and left ovaries grossly normal in size. Right ovary is normal. Left ovary has a cystic mass 18 x 16 with color flow in the wall and low level heterogeneous echo pattern. A separate 19 x 20 x 12 mm avascular area noted with low level echoes. Cul-de-sac is negative. The previous larger echo-free cyst is resolved.  Assessment/Plan:  53 y.o. R4E3154 with 2 cysts left ovary suspicious for endometriomas. They do appear to be slightly smaller than previously noted and the possibility of slowly resolving hemorrhagic cyst also discussed. Options for management include continued expected management with repeat ultrasound in several months versus proceeding with laparoscopic removal now reviewed and the patient wants to proceed with laparoscopy given her discomfort. I reviewed with her the options of laparoscopic cystectomy with retaining viable normal appearing ovaries if possible for continued hormone production recognizing she is in the  perimenopausal age 57 and starting to have some hot flashes. Versus removing both ovaries regardless and accepted the potential for HRT and a less smooth transition through menopause. At this point we'll plan on removing both ovaries but she is going to think of this and decide ultimately preoperatively. The risks HRT were reviewed versus risks of keeping ovarian tissue to include the long-term issues of ovarian disease such as ovarian cancer or benign pathology requiring reoperation. I reviewed the proposed surgery with her to include the intraoperative/postoperative courses as well as the recovery period. Use of the laparoscope, multiple port sites and the surgery in general were discussed. The risks were reviewed with her to include the risk of converting to an open laparotomy if significant adhesions or disease is encountered with a larger incision and a longer recovery period. The risk of infection requiring prolonged antibiotics as well as the risk of incisional complications requiring opening and draining of incisions and closure by secondary intention and long-term issues of keloid/cosmetics and hernia formation reviewed. The risk of hemorrhage necessitating transfusion and the risks of transfusion were discussed to include transfusion reaction, hepatitis, HIV, mad cow disease and other unknown entities. The risk of inadvertent injury to internal organs including bowel, bladder, vessels, nerves, ureters either immediately recognized or delay recognized necessitating major exploratory reparative surgeries and future reparative surgeries including bowel resection, ostomy formation, bladder repair and ureteral damage repair was all discussed with her. No guarantees as far as pain relief was reviewed with her and that her pain may persist worsen or change following the procedure. The patient's questions were answered to her satisfaction. She is due for a full annual exam and  we will time this preoperatively to  also coincide with a preoperative appointment.  The patient recently had a right foot fracture and her orthopedist asked her obtain a bone density and I went ahead and ordered this for her also.     [name], 11:53 AM 06/11/2014

## 2014-06-12 LAB — URINALYSIS W MICROSCOPIC + REFLEX CULTURE
Bacteria, UA: NONE SEEN
Bilirubin Urine: NEGATIVE
CRYSTALS: NONE SEEN
Casts: NONE SEEN
GLUCOSE, UA: NEGATIVE mg/dL
HGB URINE DIPSTICK: NEGATIVE
KETONES UR: NEGATIVE mg/dL
LEUKOCYTES UA: NEGATIVE
Nitrite: NEGATIVE
Protein, ur: NEGATIVE mg/dL
SPECIFIC GRAVITY, URINE: 1.016 (ref 1.005–1.030)
Squamous Epithelial / LPF: NONE SEEN
Urobilinogen, UA: 0.2 mg/dL (ref 0.0–1.0)
pH: 6 (ref 5.0–8.0)

## 2014-06-19 ENCOUNTER — Other Ambulatory Visit: Payer: Self-pay | Admitting: Gynecology

## 2014-06-19 ENCOUNTER — Ambulatory Visit (INDEPENDENT_AMBULATORY_CARE_PROVIDER_SITE_OTHER): Payer: Managed Care, Other (non HMO)

## 2014-06-19 DIAGNOSIS — S92901A Unspecified fracture of right foot, initial encounter for closed fracture: Secondary | ICD-10-CM

## 2014-06-19 DIAGNOSIS — M8580 Other specified disorders of bone density and structure, unspecified site: Secondary | ICD-10-CM

## 2014-06-20 ENCOUNTER — Encounter: Payer: Self-pay | Admitting: Gynecology

## 2014-06-23 ENCOUNTER — Other Ambulatory Visit: Payer: Self-pay | Admitting: Gynecology

## 2014-06-23 ENCOUNTER — Encounter: Payer: Self-pay | Admitting: Gynecology

## 2014-06-23 DIAGNOSIS — M8580 Other specified disorders of bone density and structure, unspecified site: Secondary | ICD-10-CM

## 2014-06-24 ENCOUNTER — Other Ambulatory Visit (HOSPITAL_COMMUNITY)
Admission: RE | Admit: 2014-06-24 | Discharge: 2014-06-24 | Disposition: A | Discharge status: Home / Self Care | Payer: Managed Care, Other (non HMO) | Source: Ambulatory Visit | Attending: Gynecology | Admitting: Gynecology

## 2014-06-24 ENCOUNTER — Encounter: Payer: Self-pay | Admitting: Gynecology

## 2014-06-24 ENCOUNTER — Ambulatory Visit (INDEPENDENT_AMBULATORY_CARE_PROVIDER_SITE_OTHER): Payer: Managed Care, Other (non HMO) | Admitting: Gynecology

## 2014-06-24 VITALS — BP 124/80 | Ht 61.5 in | Wt 136.0 lb

## 2014-06-24 DIAGNOSIS — R102 Pelvic and perineal pain: Secondary | ICD-10-CM

## 2014-06-24 DIAGNOSIS — Z01419 Encounter for gynecological examination (general) (routine) without abnormal findings: Secondary | ICD-10-CM | POA: Insufficient documentation

## 2014-06-24 DIAGNOSIS — M8580 Other specified disorders of bone density and structure, unspecified site: Secondary | ICD-10-CM

## 2014-06-24 DIAGNOSIS — N951 Menopausal and female climacteric states: Secondary | ICD-10-CM

## 2014-06-24 LAB — COMPREHENSIVE METABOLIC PANEL
ALT: 29 U/L (ref 0–35)
AST: 21 U/L (ref 0–37)
Albumin: 4.6 g/dL (ref 3.5–5.2)
Alkaline Phosphatase: 116 U/L (ref 39–117)
BUN: 14 mg/dL (ref 6–23)
CALCIUM: 9.5 mg/dL (ref 8.4–10.5)
CO2: 24 meq/L (ref 19–32)
Chloride: 100 mEq/L (ref 96–112)
Creat: 0.81 mg/dL (ref 0.50–1.10)
Glucose, Bld: 73 mg/dL (ref 70–99)
Potassium: 3.9 mEq/L (ref 3.5–5.3)
Sodium: 138 mEq/L (ref 135–145)
Total Bilirubin: 0.3 mg/dL (ref 0.2–1.2)
Total Protein: 7.4 g/dL (ref 6.0–8.3)

## 2014-06-24 LAB — CBC WITH DIFFERENTIAL/PLATELET
Basophils Absolute: 0.1 10*3/uL (ref 0.0–0.1)
Basophils Relative: 1 % (ref 0–1)
Eosinophils Absolute: 0.4 10*3/uL (ref 0.0–0.7)
Eosinophils Relative: 8 % — ABNORMAL HIGH (ref 0–5)
HEMATOCRIT: 37.6 % (ref 36.0–46.0)
HEMOGLOBIN: 13.3 g/dL (ref 12.0–15.0)
LYMPHS ABS: 1.2 10*3/uL (ref 0.7–4.0)
Lymphocytes Relative: 22 % (ref 12–46)
MCH: 28.3 pg (ref 26.0–34.0)
MCHC: 35.4 g/dL (ref 30.0–36.0)
MCV: 80 fL (ref 78.0–100.0)
MONO ABS: 0.6 10*3/uL (ref 0.1–1.0)
MONOS PCT: 12 % (ref 3–12)
NEUTROS ABS: 3.1 10*3/uL (ref 1.7–7.7)
Neutrophils Relative %: 57 % (ref 43–77)
Platelets: 216 10*3/uL (ref 150–400)
RBC: 4.7 MIL/uL (ref 3.87–5.11)
RDW: 13.7 % (ref 11.5–15.5)
WBC: 5.4 10*3/uL (ref 4.0–10.5)

## 2014-06-24 LAB — LIPID PANEL
Cholesterol: 132 mg/dL (ref 0–200)
HDL: 55 mg/dL (ref >39)
LDL Cholesterol: 57 mg/dL (ref 0–99)
Total CHOL/HDL Ratio: 2.4 Ratio
Triglycerides: 100 mg/dL (ref <150)
VLDL: 20 mg/dL (ref 0–40)

## 2014-06-24 LAB — TSH: TSH: 1.539 u[IU]/mL (ref 0.350–4.500)

## 2014-06-24 LAB — FOLLICLE STIMULATING HORMONE: FSH: 158 m[IU]/mL — ABNORMAL HIGH

## 2014-06-24 NOTE — Patient Instructions (Signed)
Follow up for surgery as scheduled  You may obtain a copy of any labs that were done today by logging onto MyChart as outlined in the instructions provided with your AVS (after visit summary). The office will not call with normal lab results but certainly if there are any significant abnormalities then we will contact you.   Health Maintenance, Female A healthy lifestyle and preventative care can promote health and wellness.  Maintain regular health, dental, and eye exams.  Eat a healthy diet. Foods like vegetables, fruits, whole grains, low-fat dairy products, and lean protein foods contain the nutrients you need without too many calories. Decrease your intake of foods high in solid fats, added sugars, and salt. Get information about a proper diet from your caregiver, if necessary.  Regular physical exercise is one of the most important things you can do for your health. Most adults should get at least 150 minutes of moderate-intensity exercise (any activity that increases your heart rate and causes you to sweat) each week. In addition, most adults need muscle-strengthening exercises on 2 or more days a week.   Maintain a healthy weight. The body mass index (BMI) is a screening tool to identify possible weight problems. It provides an estimate of body fat based on height and weight. Your caregiver can help determine your BMI, and can help you achieve or maintain a healthy weight. For adults 20 years and older:  A BMI below 18.5 is considered underweight.  A BMI of 18.5 to 24.9 is normal.  A BMI of 25 to 29.9 is considered overweight.  A BMI of 30 and above is considered obese.  Maintain normal blood lipids and cholesterol by exercising and minimizing your intake of saturated fat. Eat a balanced diet with plenty of fruits and vegetables. Blood tests for lipids and cholesterol should begin at age 64 and be repeated every 5 years. If your lipid or cholesterol levels are high, you are over 50,  or you are a high risk for heart disease, you may need your cholesterol levels checked more frequently.Ongoing high lipid and cholesterol levels should be treated with medicines if diet and exercise are not effective.  If you smoke, find out from your caregiver how to quit. If you do not use tobacco, do not start.  Lung cancer screening is recommended for adults aged 76 80 years who are at high risk for developing lung cancer because of a history of smoking. Yearly low-dose computed tomography (CT) is recommended for people who have at least a 30-pack-year history of smoking and are a current smoker or have quit within the past 15 years. A pack year of smoking is smoking an average of 1 pack of cigarettes a day for 1 year (for example: 1 pack a day for 30 years or 2 packs a day for 15 years). Yearly screening should continue until the smoker has stopped smoking for at least 15 years. Yearly screening should also be stopped for people who develop a health problem that would prevent them from having lung cancer treatment.  If you are pregnant, do not drink alcohol. If you are breastfeeding, be very cautious about drinking alcohol. If you are not pregnant and choose to drink alcohol, do not exceed 1 drink per day. One drink is considered to be 12 ounces (355 mL) of beer, 5 ounces (148 mL) of wine, or 1.5 ounces (44 mL) of liquor.  Avoid use of street drugs. Do not share needles with anyone. Ask for help if  you need support or instructions about stopping the use of drugs.  High blood pressure causes heart disease and increases the risk of stroke. Blood pressure should be checked at least every 1 to 2 years. Ongoing high blood pressure should be treated with medicines, if weight loss and exercise are not effective.  If you are 78 to 51 years old, ask your caregiver if you should take aspirin to prevent strokes.  Diabetes screening involves taking a blood sample to check your fasting blood sugar level. This  should be done once every 3 years, after age 23, if you are within normal weight and without risk factors for diabetes. Testing should be considered at a younger age or be carried out more frequently if you are overweight and have at least 1 risk factor for diabetes.  Breast cancer screening is essential preventative care for women. You should practice "breast self-awareness." This means understanding the normal appearance and feel of your breasts and may include breast self-examination. Any changes detected, no matter how small, should be reported to a caregiver. Women in their 5s and 30s should have a clinical breast exam (CBE) by a caregiver as part of a regular health exam every 1 to 3 years. After age 36, women should have a CBE every year. Starting at age 57, women should consider having a mammogram (breast X-ray) every year. Women who have a family history of breast cancer should talk to their caregiver about genetic screening. Women at a high risk of breast cancer should talk to their caregiver about having an MRI and a mammogram every year.  Breast cancer gene (BRCA)-related cancer risk assessment is recommended for women who have family members with BRCA-related cancers. BRCA-related cancers include breast, ovarian, tubal, and peritoneal cancers. Having family members with these cancers may be associated with an increased risk for harmful changes (mutations) in the breast cancer genes BRCA1 and BRCA2. Results of the assessment will determine the need for genetic counseling and BRCA1 and BRCA2 testing.  The Pap test is a screening test for cervical cancer. Women should have a Pap test starting at age 16. Between ages 13 and 55, Pap tests should be repeated every 2 years. Beginning at age 75, you should have a Pap test every 3 years as long as the past 3 Pap tests have been normal. If you had a hysterectomy for a problem that was not cancer or a condition that could lead to cancer, then you no longer  need Pap tests. If you are between ages 64 and 70, and you have had normal Pap tests going back 10 years, you no longer need Pap tests. If you have had past treatment for cervical cancer or a condition that could lead to cancer, you need Pap tests and screening for cancer for at least 20 years after your treatment. If Pap tests have been discontinued, risk factors (such as a new sexual partner) need to be reassessed to determine if screening should be resumed. Some women have medical problems that increase the chance of getting cervical cancer. In these cases, your caregiver may recommend more frequent screening and Pap tests.  The human papillomavirus (HPV) test is an additional test that may be used for cervical cancer screening. The HPV test looks for the virus that can cause the cell changes on the cervix. The cells collected during the Pap test can be tested for HPV. The HPV test could be used to screen women aged 91 years and older, and should  be used in women of any age who have unclear Pap test results. After the age of 58, women should have HPV testing at the same frequency as a Pap test.  Colorectal cancer can be detected and often prevented. Most routine colorectal cancer screening begins at the age of 59 and continues through age 68. However, your caregiver may recommend screening at an earlier age if you have risk factors for colon cancer. On a yearly basis, your caregiver may provide home test kits to check for hidden blood in the stool. Use of a small camera at the end of a tube, to directly examine the colon (sigmoidoscopy or colonoscopy), can detect the earliest forms of colorectal cancer. Talk to your caregiver about this at age 20, when routine screening begins. Direct examination of the colon should be repeated every 5 to 10 years through age 76, unless early forms of pre-cancerous polyps or small growths are found.  Hepatitis C blood testing is recommended for all people born from [birth year]  through 1965 and any individual with known risks for hepatitis C.  Practice safe sex. Use condoms and avoid high-risk sexual practices to reduce the spread of sexually transmitted infections (STIs). Sexually active women aged 59 and younger should be checked for Chlamydia, which is a common sexually transmitted infection. Older women with new or multiple partners should also be tested for Chlamydia. Testing for other STIs is recommended if you are sexually active and at increased risk.  Osteoporosis is a disease in which the bones lose minerals and strength with aging. This can result in serious bone fractures. The risk of osteoporosis can be identified using a bone density scan. Women ages 27 and over and women at risk for fractures or osteoporosis should discuss screening with their caregivers. Ask your caregiver whether you should be taking a calcium supplement or vitamin D to reduce the rate of osteoporosis.  Menopause can be associated with physical symptoms and risks. Hormone replacement therapy is available to decrease symptoms and risks. You should talk to your caregiver about whether hormone replacement therapy is right for you.  Use sunscreen. Apply sunscreen liberally and repeatedly throughout the day. You should seek shade when your shadow is shorter than you. Protect yourself by wearing long sleeves, pants, a wide-brimmed hat, and sunglasses year round, whenever you are outdoors.  Notify your caregiver of new moles or changes in moles, especially if there is a change in shape or color. Also notify your caregiver if a mole is larger than the size of a pencil eraser.  Stay current with your immunizations. Document Released: 02/21/2011 Document Revised: 12/03/2012 Document Reviewed: 02/21/2011 [facility] Patient Information 2014 [place].

## 2014-06-24 NOTE — Addendum Note (Signed)
Addended by: Nelva Nay on: 06/24/2014 08:40 AM   Modules accepted: Orders, SmartSet

## 2014-06-24 NOTE — Progress Notes (Signed)
Yolanda [name] 02-Mar-1961 [id number]        53 y.o.  G2P2002 for annual exam.  Several issues noted below  Past medical history,surgical history, problem list, medications, allergies, family history and social history were all reviewed and documented as reviewed in the EPIC chart.  ROS:  12 system ROS performed with pertinent positives and negatives included in the history, assessment and plan.   Additional significant findings :  none   Exam: Kim Counsellor Vitals:   06/24/14 0756  BP: 124/80  Height: 5' 1.5" (1.562 m)  Weight: 136 lb (61.689 kg)   General appearance:  Normal affect, orientation and appearance. Skin: Grossly normal HEENT: Without gross lesions.  No cervical or supraclavicular adenopathy. Thyroid normal.  Lungs:  Clear without wheezing, rales or rhonchi Cardiac: RR, without RMG Abdominal:  Soft, nontender, without masses, guarding, rebound, organomegaly or hernia Breasts:  Examined lying and sitting without masses, retractions, discharge or axillary adenopathy. Pelvic:  Ext/BUS/vagina normal. Pap of cuff done  Adnexa  Without masses or tenderness    Anus and perineum  Normal   Rectovaginal  Normal sphincter tone without palpated masses or tenderness.    Assessment/Plan:  53 y.o. [id number] female for annual exam.   1. Pelvic pain. Patient scheduled for laparoscopic bilateral salpingo-oophorectomy 07/02/2014 for pelvic pain, ovarian cysts suspicious for endometriomas. History of TVH in the past for dysmenorrhea/menorrhagia with final pathology showing leiomyoma and adenomyosis. I again reviewed the proposed surgery with the patient to include intraoperative/postoperative courses and recovery.  I reviewed the risks. I discussed possible HRT following the surgery and the risks including the WHI study with increased risk of stroke heart attack DVT and breast cancer. I emphasized no guarantee as far as pain relief and that her pain may worsen or recur following the  procedure. The full counseling discussion is documented in my dictated history and physical that I dictated today for the surgery. 2. Menopausal symptoms. We'll check baseline FSH now. Consider HRT as discussed above. 3. Osteopenia. DEXA 05/2014 T score -1.6 FRAX 5.2%/0.5%. It was ordered per recommendation from her orthopedist due to a fracture in her foot. I reviewed osteopenia with her and recommended calcium/vitamin D supplementation. Will check vitamin D level today. Plan repeat DEXA at two-year interval. 4. Pap smear 2011. Pap of vaginal cuff done today. No history of abnormal Pap smears previously.  Reviewed current screening guidelines and she is status post hysterectomy for benign indications and options to stop screening altogether versus less frequent screening intervals reviewed. 5. Mammography 09/2013. Continue with annual mammography. SBE monthly reviewed. 6. Colonoscopy 2014. Repeat at their recommended interval. 7. Health maintenance. Baseline CBC comprehensive metabolic panel lipid profile urine analysis TSH vitamin D FSH ordered. Follow up for surgery as scheduled.     [name], 8:26 AM 06/24/2014

## 2014-06-24 NOTE — H&P (Signed)
Yolanda [name] 1961-05-03 [id number]   History and Physical  Chief complaint: pelvic pain, ovarian cysts  History of present illness: 53 y.o. G2P2002 with the onset of pelvic pain July 2015.  Describes as sharp stabbing aching radiating to her back at times.  No associated findings such as constipation diarrhea or urinary symptoms such as frequency dysuria urgency. Ultrasound showed several small cysts of the left ovary consistent with hemorrhagic changes/rule out endometriomas. Patient was managed expectantly with follow up ultrasound in 2 months later with persistence of the hemorrhagic appearing cysts and worsening of her pain now occurring more consistently throughout the week as well as having deep dyspareunia for having intercourse. Options for management to include expectant management, conservative laparoscopic assessment and cystectomy versus laparoscopic bilateral salpingo-oophorectomy were reviewed and the patient wants to proceed with laparoscopic bilateral salpingo-oophorectomy as reviewed below.  Past medical history,surgical history, medications, allergies, family history and social history were all reviewed and documented in the EPIC chart.  ROS:  Was performed and pertinent positives and negatives are included in the history of present illness.  Exam:  Kim assistant 06/24/2014 General: well developed, well nourished female, no acute distress HEENT: normal  Lungs: clear to auscultation without wheezing, rales or rhonchi  Cardiac: regular rate without rubs, murmurs or gallops  Abdomen: soft, nontender without masses, guarding, rebound, organomegaly  Pelvic: external bus vagina: normal   Cervix: grossly normal  Uterus: normal size, midline and mobile, nontender  Adnexa: without masses or tenderness  Rectovaginal exam within normal limits    Assessment/Plan:  53 y.o. [id number] with persistent/worsening pelvic pain and dyspareunia with ultrasound showing 2 cysts left ovary  suspicious for endometriomas. History of TVH for menorrhagia/dysmenorrhea 2005 with final pathology showing leiomyoma and adenomyosis.  The ovarian cysts do appear to be slightly smaller than previously noted and the possibility of slowly resolving hemorrhagic cysts was also discussed. Options for management include continued expected management with repeat ultrasound in several months versus proceeding with laparoscopic removal now reviewed and the patient wants to proceed with laparoscopy given her discomfort. I reviewed with her the options of laparoscopic cystectomy with retaining viable normal appearing ovaries if possible for continued hormone production recognizing she is in the perimenopausal age 67 and starting to have some hot flashes. Versus removing both ovaries regardless and accepted the potential for HRT and a less smooth transition through menopause. The risks of ovarian disease in the future if retaining her ovaries to include pain, recurrent cysts and ovarian cancer versus the risks of potential HRT including the WHI study with increased risk of stroke heart attack DVT and breast cancer was reviewed with her.  Patient wants to proceed with laparoscopic bilateral salpingo-oophorectomy regardless and will accept the potential for HRT in the future if needed.  I reviewed the proposed surgery with her to include the intraoperative/postoperative courses as well as the recovery period. Use of the laparoscope, multiple port sites and the surgery in general were discussed. The risks were reviewed with her to include the risk of converting to an open laparotomy if significant adhesions or disease is encountered with a larger incision and a longer recovery period. The risk of infection requiring prolonged antibiotics, abscess/hematoma development requiring reoperation.  The risks of hemorrhage necessitating transfusion and the risks of transfusion discussed to include transfusion reaction, hepatitis, HIV,  mad cow disease and other unknown entities. The risk of incisional complications requiring opening and draining of incisions and closure by secondary intention and long-term issues of keloid/cosmetics  and hernia formation reviewed. The risk of inadvertent injury to internal organs including bowel, bladder, vessels, nerves, ureters either immediately recognized or delay recognized necessitating major exploratory reparative surgeries and future reparative surgeries including bowel resection, ostomy formation, bladder repair and ureteral damage repair was all discussed with her. I emphasized no guarantees as far as pain relief with her and that her pain may persist, worsen or change following the procedure. The patient's questions were answered to her satisfaction she is ready to proceed with surgery.    [name], 8:34 AM 06/24/2014

## 2014-06-25 LAB — URINALYSIS W MICROSCOPIC + REFLEX CULTURE
BACTERIA UA: NONE SEEN
Bilirubin Urine: NEGATIVE
Casts: NONE SEEN
Crystals: NONE SEEN
GLUCOSE, UA: NEGATIVE mg/dL
HGB URINE DIPSTICK: NEGATIVE
KETONES UR: NEGATIVE mg/dL
LEUKOCYTES UA: NEGATIVE
Nitrite: NEGATIVE
Protein, ur: NEGATIVE mg/dL
SPECIFIC GRAVITY, URINE: 1.02 (ref 1.005–1.030)
Squamous Epithelial / LPF: NONE SEEN
Urobilinogen, UA: 0.2 mg/dL (ref 0.0–1.0)
pH: 5.5 (ref 5.0–8.0)

## 2014-06-25 LAB — CYTOLOGY - PAP

## 2014-06-25 LAB — VITAMIN D 25 HYDROXY (VIT D DEFICIENCY, FRACTURES): Vit D, 25-Hydroxy: 63 ng/mL (ref 30–89)

## 2014-07-01 MED ORDER — DEXTROSE 5 % IV SOLN
2.0000 g | INTRAVENOUS | Status: AC
  Administered 2014-07-02: 2 g via INTRAVENOUS
  Filled 2014-07-01: qty 2

## 2014-07-02 ENCOUNTER — Ambulatory Visit (HOSPITAL_COMMUNITY): Payer: Managed Care, Other (non HMO) | Admitting: Anesthesiology

## 2014-07-02 ENCOUNTER — Ambulatory Visit (HOSPITAL_COMMUNITY)
Admission: RE | Admit: 2014-07-02 | Discharge: 2014-07-02 | Disposition: A | Discharge status: Home / Self Care | Payer: Managed Care, Other (non HMO) | Source: Ambulatory Visit | Attending: Gynecology | Admitting: Gynecology

## 2014-07-02 ENCOUNTER — Encounter (HOSPITAL_COMMUNITY)
Admission: RE | Disposition: A | Discharge status: Home / Self Care | Payer: Self-pay | Source: Ambulatory Visit | Attending: Gynecology

## 2014-07-02 ENCOUNTER — Encounter (HOSPITAL_COMMUNITY): Payer: Self-pay | Admitting: *Deleted

## 2014-07-02 DIAGNOSIS — R102 Pelvic and perineal pain: Secondary | ICD-10-CM

## 2014-07-02 DIAGNOSIS — N3010 Interstitial cystitis (chronic) without hematuria: Secondary | ICD-10-CM | POA: Diagnosis not present

## 2014-07-02 DIAGNOSIS — N830 Follicular cyst of ovary: Secondary | ICD-10-CM | POA: Insufficient documentation

## 2014-07-02 DIAGNOSIS — N736 Female pelvic peritoneal adhesions (postinfective): Secondary | ICD-10-CM | POA: Diagnosis not present

## 2014-07-02 DIAGNOSIS — N8320 Unspecified ovarian cysts: Secondary | ICD-10-CM | POA: Diagnosis present

## 2014-07-02 DIAGNOSIS — N800 Endometriosis of uterus: Secondary | ICD-10-CM | POA: Diagnosis not present

## 2014-07-02 DIAGNOSIS — K449 Diaphragmatic hernia without obstruction or gangrene: Secondary | ICD-10-CM | POA: Insufficient documentation

## 2014-07-02 DIAGNOSIS — F329 Major depressive disorder, single episode, unspecified: Secondary | ICD-10-CM | POA: Diagnosis not present

## 2014-07-02 DIAGNOSIS — N920 Excessive and frequent menstruation with regular cycle: Secondary | ICD-10-CM | POA: Diagnosis not present

## 2014-07-02 DIAGNOSIS — N838 Other noninflammatory disorders of ovary, fallopian tube and broad ligament: Secondary | ICD-10-CM | POA: Diagnosis not present

## 2014-07-02 HISTORY — PX: LAPAROSCOPIC BILATERAL SALPINGO OOPHERECTOMY: SHX5890

## 2014-07-02 SURGERY — SALPINGO-OOPHORECTOMY, BILATERAL, LAPAROSCOPIC
Anesthesia: General | Laterality: Bilateral

## 2014-07-02 MED ORDER — SCOPOLAMINE 1 MG/3DAYS TD PT72
1.0000 | MEDICATED_PATCH | Freq: Once | TRANSDERMAL | Status: DC
  Administered 2014-07-02: 1.5 mg via TRANSDERMAL

## 2014-07-02 MED ORDER — GLYCOPYRROLATE 0.2 MG/ML IJ SOLN
INTRAMUSCULAR | Status: AC
  Filled 2014-07-02: qty 3

## 2014-07-02 MED ORDER — LIDOCAINE HCL (CARDIAC) 20 MG/ML IV SOLN
INTRAVENOUS | Status: AC
  Filled 2014-07-02: qty 5

## 2014-07-02 MED ORDER — NEOSTIGMINE METHYLSULFATE 10 MG/10ML IV SOLN
INTRAVENOUS | Status: AC
  Filled 2014-07-02: qty 1

## 2014-07-02 MED ORDER — SCOPOLAMINE 1 MG/3DAYS TD PT72
MEDICATED_PATCH | TRANSDERMAL | Status: AC
  Filled 2014-07-02: qty 1

## 2014-07-02 MED ORDER — KETOROLAC TROMETHAMINE 30 MG/ML IJ SOLN
INTRAMUSCULAR | Status: DC | PRN
  Administered 2014-07-02: 30 mg via INTRAVENOUS

## 2014-07-02 MED ORDER — FENTANYL CITRATE 0.05 MG/ML IJ SOLN
INTRAMUSCULAR | Status: AC
  Filled 2014-07-02: qty 5

## 2014-07-02 MED ORDER — GLYCOPYRROLATE 0.2 MG/ML IJ SOLN
INTRAMUSCULAR | Status: DC | PRN
  Administered 2014-07-02: 0.6 mg via INTRAVENOUS

## 2014-07-02 MED ORDER — PROPOFOL 10 MG/ML IV BOLUS
INTRAVENOUS | Status: DC | PRN
  Administered 2014-07-02: 40 mg via INTRAVENOUS
  Administered 2014-07-02: 150 mg via INTRAVENOUS

## 2014-07-02 MED ORDER — ACETAMINOPHEN 10 MG/ML IV SOLN
1000.0000 mg | Freq: Once | INTRAVENOUS | Status: DC
  Filled 2014-07-02: qty 100

## 2014-07-02 MED ORDER — BUPIVACAINE HCL (PF) 0.25 % IJ SOLN
INTRAMUSCULAR | Status: AC
  Filled 2014-07-02: qty 30

## 2014-07-02 MED ORDER — ACETAMINOPHEN 10 MG/ML IV SOLN
1000.0000 mg | Freq: Once | INTRAVENOUS | Status: DC
  Administered 2014-07-02: 1000 mg via INTRAVENOUS
  Filled 2014-07-02: qty 100

## 2014-07-02 MED ORDER — ROCURONIUM BROMIDE 100 MG/10ML IV SOLN
INTRAVENOUS | Status: DC | PRN
  Administered 2014-07-02: 35 mg via INTRAVENOUS
  Administered 2014-07-02: 10 mg via INTRAVENOUS

## 2014-07-02 MED ORDER — METHYLENE BLUE 1 % INJ SOLN
INTRAMUSCULAR | Status: AC
  Filled 2014-07-02: qty 1

## 2014-07-02 MED ORDER — MIDAZOLAM HCL 2 MG/2ML IJ SOLN
INTRAMUSCULAR | Status: AC
Start: 2014-07-02 — End: 2014-07-02
  Filled 2014-07-02: qty 2

## 2014-07-02 MED ORDER — PROMETHAZINE HCL 25 MG/ML IJ SOLN: 6.2500 mg | INTRAMUSCULAR | Status: DC | PRN

## 2014-07-02 MED ORDER — OXYCODONE-ACETAMINOPHEN 5-325 MG PO TABS: 1.0000 | ORAL_TABLET | ORAL | Status: DC | PRN

## 2014-07-02 MED ORDER — MIDAZOLAM HCL 2 MG/2ML IJ SOLN
INTRAMUSCULAR | Status: DC | PRN
  Administered 2014-07-02: 2 mg via INTRAVENOUS

## 2014-07-02 MED ORDER — DEXAMETHASONE SODIUM PHOSPHATE 4 MG/ML IJ SOLN
INTRAMUSCULAR | Status: AC
  Filled 2014-07-02: qty 1

## 2014-07-02 MED ORDER — HEPARIN SODIUM (PORCINE) 5000 UNIT/ML IJ SOLN
INTRAMUSCULAR | Status: AC
  Filled 2014-07-02: qty 1

## 2014-07-02 MED ORDER — NEOSTIGMINE METHYLSULFATE 10 MG/10ML IV SOLN
INTRAVENOUS | Status: DC | PRN
  Administered 2014-07-02: 3 mg via INTRAVENOUS

## 2014-07-02 MED ORDER — MEPERIDINE HCL 25 MG/ML IJ SOLN: 6.2500 mg | INTRAMUSCULAR | Status: DC | PRN

## 2014-07-02 MED ORDER — PROPOFOL 10 MG/ML IV EMUL
INTRAVENOUS | Status: AC
  Filled 2014-07-02: qty 20

## 2014-07-02 MED ORDER — FENTANYL CITRATE 0.05 MG/ML IJ SOLN: 25.0000 ug | INTRAMUSCULAR | Status: DC | PRN

## 2014-07-02 MED ORDER — DEXAMETHASONE SODIUM PHOSPHATE 10 MG/ML IJ SOLN
INTRAMUSCULAR | Status: DC | PRN
  Administered 2014-07-02: 4 mg via INTRAVENOUS

## 2014-07-02 MED ORDER — OXYCODONE-ACETAMINOPHEN 5-325 MG PO TABS
ORAL_TABLET | ORAL | Status: AC
  Filled 2014-07-02: qty 1

## 2014-07-02 MED ORDER — ONDANSETRON HCL 4 MG/2ML IJ SOLN
INTRAMUSCULAR | Status: AC
  Filled 2014-07-02: qty 2

## 2014-07-02 MED ORDER — BUPIVACAINE HCL (PF) 0.25 % IJ SOLN
INTRAMUSCULAR | Status: DC | PRN
  Administered 2014-07-02: 10 mL

## 2014-07-02 MED ORDER — ONDANSETRON HCL 4 MG/2ML IJ SOLN
INTRAMUSCULAR | Status: DC | PRN
  Administered 2014-07-02: 4 mg via INTRAVENOUS

## 2014-07-02 MED ORDER — OXYCODONE-ACETAMINOPHEN 5-325 MG PO TABS
1.0000 | ORAL_TABLET | ORAL | Status: DC | PRN
  Administered 2014-07-02: 1 via ORAL

## 2014-07-02 MED ORDER — LACTATED RINGERS IV SOLN
INTRAVENOUS | Status: DC
  Administered 2014-07-02: 12:00:00 via INTRAVENOUS

## 2014-07-02 MED ORDER — ROCURONIUM BROMIDE 100 MG/10ML IV SOLN
INTRAVENOUS | Status: AC
  Filled 2014-07-02: qty 1

## 2014-07-02 MED ORDER — LIDOCAINE HCL (CARDIAC) 20 MG/ML IV SOLN
INTRAVENOUS | Status: DC | PRN
  Administered 2014-07-02: 50 mg via INTRAVENOUS

## 2014-07-02 MED ORDER — FENTANYL CITRATE 0.05 MG/ML IJ SOLN
INTRAMUSCULAR | Status: DC | PRN
  Administered 2014-07-02: 100 ug via INTRAVENOUS
  Administered 2014-07-02 (×2): 50 ug via INTRAVENOUS

## 2014-07-02 MED ORDER — MIDAZOLAM HCL 2 MG/2ML IJ SOLN: 0.5000 mg | Freq: Once | INTRAMUSCULAR | Status: DC | PRN

## 2014-07-02 MED ORDER — KETOROLAC TROMETHAMINE 30 MG/ML IJ SOLN: 15.0000 mg | Freq: Once | INTRAMUSCULAR | Status: DC | PRN

## 2014-07-02 SURGICAL SUPPLY — 29 items
BAG SPEC RTRVL LRG 6X4 10 (ENDOMECHANICALS) ×1
BARRIER ADHS 3X4 INTERCEED (GAUZE/BANDAGES/DRESSINGS) IMPLANT
BLADE 15 SAFETY STRL DISP (BLADE) ×3 IMPLANT
BLADE SURG 11 STRL SS (BLADE) ×1 IMPLANT
BRR ADH 4X3 ABS CNTRL BYND (GAUZE/BANDAGES/DRESSINGS)
CABLE HIGH FREQUENCY MONO STRZ (ELECTRODE) IMPLANT
CATH ROBINSON RED A/P 16FR (CATHETERS) ×3 IMPLANT
CLOTH BEACON ORANGE TIMEOUT ST (SAFETY) ×3 IMPLANT
CONTAINER PREFILL 10% NBF 60ML (FORM) ×4 IMPLANT
DRSG COVADERM PLUS 2X2 (GAUZE/BANDAGES/DRESSINGS) ×6 IMPLANT
DRSG OPSITE POSTOP 3X4 (GAUZE/BANDAGES/DRESSINGS) ×2 IMPLANT
FILTER SMOKE EVAC LAPAROSHD (FILTER) ×2 IMPLANT
GLOVE BIO SURGEON STRL SZ7.5 (GLOVE) ×17 IMPLANT
GOWN STRL REUS W/TWL LRG LVL3 (GOWN DISPOSABLE) ×10 IMPLANT
LIQUID BAND (GAUZE/BANDAGES/DRESSINGS) ×2 IMPLANT
NS IRRIG 1000ML POUR BTL (IV SOLUTION) ×3 IMPLANT
PACK LAPAROSCOPY BASIN (CUSTOM PROCEDURE TRAY) ×3 IMPLANT
PAD TRENDELENBURG OR TABLE (MISCELLANEOUS) ×3 IMPLANT
POUCH SPECIMEN RETRIEVAL 10MM (ENDOMECHANICALS) ×2 IMPLANT
PROTECTOR NERVE ULNAR (MISCELLANEOUS) ×5 IMPLANT
SET IRRIG TUBING LAPAROSCOPIC (IRRIGATION / IRRIGATOR) ×2 IMPLANT
SHEARS HARMONIC ACE PLUS 36CM (ENDOMECHANICALS) ×2 IMPLANT
SUT PLAIN 4 0 FS 2 27 (SUTURE) ×3 IMPLANT
SUT VICRYL 0 UR6 27IN ABS (SUTURE) ×3 IMPLANT
TOWEL OR 17X24 6PK STRL BLUE (TOWEL DISPOSABLE) ×6 IMPLANT
TROCAR XCEL NON-BLD 11X100MML (ENDOMECHANICALS) ×3 IMPLANT
TROCAR XCEL NON-BLD 5MMX100MML (ENDOMECHANICALS) ×5 IMPLANT
WARMER LAPAROSCOPE (MISCELLANEOUS) ×3 IMPLANT
WATER STERILE IRR 1000ML POUR (IV SOLUTION) ×1 IMPLANT

## 2014-07-02 NOTE — Discharge Instructions (Signed)
Postoperative Instructions Laparoscopy  Dr. Phineas Real and the nursing staff have discussed postoperative instructions with you.  If you have any questions please ask them before you leave the hospital, or call Dr Elisabeth Most office at [phone].    We would like to emphasize the following instructions:  MAY TAKE IBUPROFEN (MOTRIN, ADVIL) OR ALEVE AFTER 7:30 PM FOR CRAMPS!!  ? Call the office to make your follow-up appointment as recommended by Dr Phineas Real (usually 1-2 weeks).  ? You were given a prescription, or one was ordered for you at the pharmacy you designated.  Get that prescription filled and take the medication according to instructions.  ? You may eat a regular diet, but slowly until you start having bowel movements.  ? Drink plenty of water daily.  ? Nothing in the vagina (intercourse, douching, objects of any kind) for 2 weeks.  When reinitiating intercourse, if it is uncomfortable, stop and make an appointment with Dr Phineas Real to be evaluated.  ? No driving for several days until the anesthesia has worn off and you are not having significant pain.  Car rides (short) are ok, as long as you are not having significant pain, but no traveling out of town until your postoperative appointment.  ? You may shower, but no baths for two weeks.  Walking up and down stairs is ok.  No heavy lifting, prolonged standing, repeated bending or any working out until your first  postoperative appointment.  ? Rest frequently, listen to your body and do not push yourself and overdo it.  ? Call if:  o Your pain medication does not seem strong enough. o Worsening pain or abdominal bloating o Persistent nausea or vomiting o Difficulty with urination or bowel movements. o Temperature of 101 degrees or higher. o Heavy vaginal bleeding.  If your period is due, you may use tampons.   o Incisions become red, tender or begin to drain. o You have any questions or concerns

## 2014-07-02 NOTE — Anesthesia Preprocedure Evaluation (Addendum)
Anesthesia Evaluation  Patient identified by MRN, date of birth, ID band Patient awake    Reviewed: Allergy & Precautions, H&P , NPO status , Patient's Chart, lab work & pertinent test results, reviewed documented beta blocker date and time   History of Anesthesia Complications (+) PONV  Airway Mallampati: II  TM Distance: >3 FB Neck ROM: full   Comment: +TMJ disease, s/p surgery Dental  (+) Teeth Intact   Pulmonary  breath sounds clear to auscultation        Cardiovascular Exercise Tolerance: Good Rhythm:regular Rate:Normal     Neuro/Psych PSYCHIATRIC DISORDERS Depression  Neuromuscular disease    GI/Hepatic hiatal hernia, IBS   Endo/Other    Renal/GU   Female GU complaint Interstitial cystitis    Musculoskeletal   Abdominal   Peds  Hematology   Anesthesia Other Findings Sensitive to medications PONV with narcotics, post-op pain meds  Reproductive/Obstetrics negative OB ROS                            Anesthesia Physical Anesthesia Plan  ASA: II  Anesthesia Plan: General ETT   Post-op Pain Management:    Induction:   Airway Management Planned:   Additional Equipment:   Intra-op Plan:   Post-operative Plan:   Informed Consent: I have reviewed the patients History and Physical, chart, labs and discussed the procedure including the risks, benefits and alternatives for the proposed anesthesia with the patient or authorized representative who has indicated his/her understanding and acceptance.   Dental Advisory Given  Plan Discussed with: CRNA and Surgeon  Anesthesia Plan Comments:         Anesthesia Quick Evaluation

## 2014-07-02 NOTE — Transfer of Care (Signed)
Immediate Anesthesia Transfer of Care Note  Patient: Yolanda [name]  Procedure(s) Performed: Procedure(s): LAPAROSCOPIC BILATERAL SALPINGO OOPHORECTOMY (Bilateral)  Patient Location: PACU  Anesthesia Type:General  Level of Consciousness: awake  Airway & Oxygen Therapy: Patient Spontanous Breathing  Post-op Assessment: Report given to PACU RN  Post vital signs: stable  Filed Vitals:   07/02/14 1155  BP: 123/85  Pulse: 91  Temp: 36.7 C  Resp: 16    Complications: No apparent anesthesia complications

## 2014-07-02 NOTE — Anesthesia Postprocedure Evaluation (Signed)
  Anesthesia Post-op Note  Anesthesia Post Note  Patient: Yolanda [name]  Procedure(s) Performed: Procedure(s) (LRB): LAPAROSCOPIC BILATERAL SALPINGO OOPHORECTOMY (Bilateral)  Anesthesia type: General  Patient location: PACU  Post pain: Pain level controlled  Post assessment: Post-op Vital signs reviewed  Last Vitals:  Filed Vitals:   07/02/14 1357  BP: 113/70  Pulse: 79  Temp: 36.4 C  Resp: 18    Post vital signs: Reviewed  Level of consciousness: sedated  Complications: No apparent anesthesia complications

## 2014-07-02 NOTE — H&P (Signed)
  The patient was examined.  I reviewed the proposed surgery and consent form with the patient.  The dictated history and physical is current and accurate and all questions were answered. The patient is ready to proceed with surgery and has a realistic understanding and expectation for the outcome.   [name], 12:28 PM 07/02/2014

## 2014-07-02 NOTE — Op Note (Signed)
Yolanda [name] [birth year] [id number]   Post Operative Note   Date of surgery:  07/02/2014  Pre Op Dx:  Pelvic pain, ovarian cysts  Post Op Dx:  Pelvic pain, ovarian cysts, pelvic adhesions  Procedure:  Laparoscopic bilateral salpingo-oophorectomy, lysis of adhesions  Surgeon:  Anastasio Auerbach  Assistant:  Uvaldo Rising  Anesthesia:  General  EBL:  minimal  Complications:  None  Specimen:  #1 right ovary with attached fallopian tube segment #2 left ovary with attached fallopian tube segment to pathology  Findings: EUA:  External BUS vagina normal. Bimanual without masses   Operative:  Cul-de-sac with multiple epiploica adhesions to vaginal cuff and bilateral pelvic sidewalls, lysed.  Both ovaries with physiologic cystic changes. Both fallopian tube segments grossly normal.  Upper abdominal exam liver smooth no abnormalities. Gallbladder not visualized. Appendix grossly normal free and mobile.  Procedure:  The patient was taken to the operating room, underwent general anesthesia, was placed in the low dorsal lithotomy position, received an abdominal/perineal/vaginal preparation with Betadine solution, bladder emptied with an in and out Foley catheterization, EUA performed and a sterile sponge stick was placed within the vagina. The time out was performed by the surgical team. The patient was draped in the usual fashion.  A vertical infraumbilical incision was made and the 10 mm direct entry trocar was placed under direct visualization without difficulty and the abdomen was insufflated. Right and left 5 mm lateral suprapubic ports were placed under direct visualization after transillumination for the vessels without difficulty. Examination of the pelvic organs and upper abdominal exam was then performed with findings noted above. Both ureters were identified and traced along their courses.  Using the harmonic scalpel all of the intestinal epiploica vaginal cuff/bilateral sidewall adhesions  were lysed without difficulty at their insertions to the peritoneal surface.  The right ovary was then elevated and using the harmonic scalpel the right infundibulopelvic ligament and vessels were transected without difficulty and through progressive transections using the harmonic scalpel the ovary and fallopian tube segment were freed from the pelvic sidewall and placed into the cul-de-sac for future retrieval. The ureter and pelvic vessels were clearly visible and away from the surgical site.  A similar procedure was carried out on the other side. An Endopouch bag was placed through the infraumbilical port and a 5 mm laparoscope through the lateral right suprapubic port and both specimens retrieved from the pelvis without difficulty and sent to pathology separately. The pelvis was then copiously irrigated showing adequate hemostasis and the suprapubic ports were removed under direct visualization showing adequate hemostasis. The gas was slowly allowed to escape showing adequate hemostasis at the surgical sites under a low pressure situation and the infra umbilical port was then backed out under direct visualization showing adequate hemostasis and no evidence of hernia formation. The infraumbilical port was closed within interrupted subcutaneous fascial stitch using 0 Vicryl suture and all skin incisions were closed using Dermabond skin adhesive. All skin incisions were injected using 0.25% Marcaine, the patient received intraoperative Toradol, was awakened without difficulty and taken to recovery room in good condition having tolerated the procedure well.     [name], 2:13 PM 07/02/2014

## 2014-07-03 ENCOUNTER — Encounter (HOSPITAL_COMMUNITY): Payer: Self-pay | Admitting: Gynecology

## 2014-07-16 ENCOUNTER — Ambulatory Visit (INDEPENDENT_AMBULATORY_CARE_PROVIDER_SITE_OTHER): Payer: Managed Care, Other (non HMO) | Admitting: Gynecology

## 2014-07-16 ENCOUNTER — Encounter: Payer: Self-pay | Admitting: Gynecology

## 2014-07-16 DIAGNOSIS — Z9889 Other specified postprocedural states: Secondary | ICD-10-CM

## 2014-07-16 NOTE — Patient Instructions (Signed)
Followup in one year for annual exam, sooner if any issues 

## 2014-07-16 NOTE — Progress Notes (Signed)
Yolanda [name] [birth year] [id number]        53 y.o.  [id number] Presents for postoperative visit status post laparoscopic bilateral salpingo-oophorectomy and lysis of adhesions. As done well without complaints.  Past medical history,surgical history, problem list, medications, allergies, family history and social history were all reviewed and documented in the EPIC chart.  Directed ROS with pertinent positives and negatives documented in the history of present illness/assessment and plan.  Exam: Kim assistant General appearance:  Normal Abdomen soft nontender without masses guarding rebound. Incisions healed nicely Pelvic external BUS vagina normal. Bimanual without masses or tenderness  Assessment/Plan:  53 y.o. [id number] with normal postoperative visit status post laparoscopic bilateral salpingo-oophorectomy, lysis of adhesions. Patient is having some hot flashes and night sweats. [place] before surgery was 150. I discussed HRT with her to include the WHI study with increased risk of stroke heart attack DVT and breast cancer. The timing hypophysis as far as starting earlier with possible cardiovascular protection also reviewed. At this point the patient is not interested in starting HRT. Prefers just to monitor her symptoms. Will follow up if it becomes more of an issue otherwise will return in one year when she is due for her annual exam     [name], 8:20 AM 07/16/2014

## 2014-07-21 ENCOUNTER — Encounter: Payer: Managed Care, Other (non HMO) | Admitting: Gynecology

## 2014-08-22 ENCOUNTER — Other Ambulatory Visit: Payer: Self-pay | Admitting: Gastroenterology

## 2014-08-27 ENCOUNTER — Encounter: Payer: Self-pay | Admitting: Physician Assistant

## 2014-08-27 ENCOUNTER — Other Ambulatory Visit (INDEPENDENT_AMBULATORY_CARE_PROVIDER_SITE_OTHER): Payer: Managed Care, Other (non HMO)

## 2014-08-27 ENCOUNTER — Ambulatory Visit (INDEPENDENT_AMBULATORY_CARE_PROVIDER_SITE_OTHER): Payer: Managed Care, Other (non HMO) | Admitting: Physician Assistant

## 2014-08-27 ENCOUNTER — Other Ambulatory Visit: Payer: Self-pay | Admitting: Dermatology

## 2014-08-27 ENCOUNTER — Telehealth: Payer: Self-pay | Admitting: Gastroenterology

## 2014-08-27 VITALS — BP 108/64 | HR 78 | Ht 61.5 in | Wt 141.2 lb

## 2014-08-27 DIAGNOSIS — K2970 Gastritis, unspecified, without bleeding: Secondary | ICD-10-CM

## 2014-08-27 DIAGNOSIS — R1013 Epigastric pain: Secondary | ICD-10-CM

## 2014-08-27 LAB — CBC WITH DIFFERENTIAL/PLATELET
Basophils Absolute: 0 10*3/uL (ref 0.0–0.1)
Basophils Relative: 0.5 % (ref 0.0–3.0)
Eosinophils Absolute: 1.1 10*3/uL — ABNORMAL HIGH (ref 0.0–0.7)
Eosinophils Relative: 16.4 % — ABNORMAL HIGH (ref 0.0–5.0)
HCT: 38 % (ref 36.0–46.0)
Hemoglobin: 12.6 g/dL (ref 12.0–15.0)
LYMPHS ABS: 1.8 10*3/uL (ref 0.7–4.0)
Lymphocytes Relative: 26.9 % (ref 12.0–46.0)
MCHC: 33.2 g/dL (ref 30.0–36.0)
MCV: 83.5 fl (ref 78.0–100.0)
MONOS PCT: 10.4 % (ref 3.0–12.0)
Monocytes Absolute: 0.7 10*3/uL (ref 0.1–1.0)
Neutro Abs: 3 10*3/uL (ref 1.4–7.7)
Neutrophils Relative %: 45.8 % (ref 43.0–77.0)
Platelets: 220 10*3/uL (ref 150.0–400.0)
RBC: 4.55 Mil/uL (ref 3.87–5.11)
RDW: 13.4 % (ref 11.5–15.5)
WBC: 6.5 10*3/uL (ref 4.0–10.5)

## 2014-08-27 LAB — HEPATIC FUNCTION PANEL
ALT: 51 U/L — ABNORMAL HIGH (ref 0–35)
AST: 28 U/L (ref 0–37)
Albumin: 3.9 g/dL (ref 3.5–5.2)
Alkaline Phosphatase: 121 U/L — ABNORMAL HIGH (ref 39–117)
BILIRUBIN DIRECT: 0 mg/dL (ref 0.0–0.3)
TOTAL PROTEIN: 7.3 g/dL (ref 6.0–8.3)
Total Bilirubin: 0.4 mg/dL (ref 0.2–1.2)

## 2014-08-27 LAB — LIPASE: Lipase: 30 U/L (ref 11.0–59.0)

## 2014-08-27 MED ORDER — OMEPRAZOLE 40 MG PO CPDR: 40.0000 mg | DELAYED_RELEASE_CAPSULE | Freq: Every day | ORAL | Status: DC

## 2014-08-27 NOTE — Patient Instructions (Signed)
We sent refills of Omeprazole 40 mg to [facility], [place]  Take 1 cap twice daily for 1 week then go to once daily. Avoid Non-Steroidal anti-inflammatories.  Aleve, Ibuprofen, Motrin.

## 2014-08-27 NOTE — Telephone Encounter (Signed)
Patient prefers Dr. Carlean Purl for new GI. She is having burning in stomach when she eats. Scheduled with Nicoletta Ba, PA today at 2:00 PM.

## 2014-08-27 NOTE — Progress Notes (Signed)
Patient ID: Yolanda [name], female   DOB: 08-11-1961, 54 y.o.   MRN: [id number]   Subjective:    Patient ID: Yolanda [name], female    DOB: September 12, 1960, 54 y.o.   MRN: [id number]  HPI [name] is a 54 year old white female previously known to Dr. Sharlett Iles with history of GERD, IBS, interstitial cystitis, and depression. She had undergone colonoscopy and EGD in July 2014. Endoscopy showed a 4 cm hiatal hernia,Schatzki's ring and active duodenitis. Biopsies from the duodenum were  benign. Bx was unremarkable. She underwent laparoscopic cholecystectomy in 2014,for biliary dyskinesia. More recently she had both ovaries removed in November 2015. She says that she had not been having any problems with chronic reflux symptoms and had not been taking Prilosec on a regular basis however over the past 4-6 weeks she had been using it more regularly. She says she had onset this past Sunday with epigastric pain which she describes as a burning and gnawing-type pain high in the epigastrium without radiation no associated nausea or vomiting, pain seemed to be exacerbated somewhat postprandially. No change in her bowel habits which tend to be on the looser side no melena or hematochezia. No fever or chills. She said she really hadn't been eating much over the past couple of days and today ate a little bit more for the first time. She says she feels a little bit better today than she had over the past couple of days. She says she probably drank too much wine over [place] Year's drank a lot of coffee and had taken ibuprofen to twice a day for 2-3 days in a row for sinus symptoms.  Review of Systems Pertinent positive and negative review of systems were noted in the above HPI section.  All other review of systems was otherwise negative.  Outpatient Encounter Prescriptions as of 08/27/2014  Medication Sig  . BuPROPion HCl (WELLBUTRIN PO) Take 150 mg by mouth 2 (two) times daily.   Marland Kitchen LORazepam (ATIVAN) 0.5 MG tablet Take 0.5 mg by mouth  daily as needed for sleep.   . mometasone (NASONEX) 50 MCG/ACT nasal spray Place 2 sprays into the nose daily. PATIENT NEEDS OFFICE VISIT FOR ADDITIONAL REFILLS (Patient taking differently: Place 2 sprays into the nose daily as needed. PATIENT NEEDS OFFICE VISIT FOR ADDITIONAL REFILLS)  . Multiple Vitamin (MULTIVITAMIN) tablet Take 1 tablet by mouth daily.    Marland Kitchen omeprazole (PRILOSEC) 40 MG capsule Take 1 capsule (40 mg total) by mouth daily.  . [DISCONTINUED] Omeprazole (PRILOSEC PO) Take by mouth.  Marland Kitchen ibuprofen (ADVIL,MOTRIN) 800 MG tablet Take 1 tablet (800 mg total) by mouth every 8 (eight) hours as needed. (Patient taking differently: Take 800 mg by mouth every 8 (eight) hours as needed for moderate pain. )  . [DISCONTINUED] fexofenadine (ALLEGRA) 30 MG tablet Take 30 mg by mouth 2 (two) times daily as needed (allergies).   . [DISCONTINUED] olopatadine (PATANOL) 0.1 % ophthalmic solution Place 1 drop into both eyes 2 (two) times daily. PATIENT NEEDS OFFICE VISIT FOR ADDITIONAL REFILLS (Patient not taking: Reported on 08/27/2014)   No Known Allergies Patient Active Problem List   Diagnosis Date Noted  . Pancreatic duct dilated 05/10/2012  . Depression   . IC (interstitial cystitis)   . FATIQUE AND MALAISE 10/27/2008  . CHEST PAIN 10/27/2008  . IBS 08/01/2008  . UNS ADVRS EFF UNS RX MEDICINAL&BIOLOGICAL SBSTNC 01/21/2008  . DEPRESSION 01/18/2008  . RHINITIS, CHRONIC 01/18/2008   History   Social History  . Marital  Status: Married    Spouse Name: N/A    Number of Children: N/A  . Years of Education: N/A   Occupational History  . Not on file.   Social History Main Topics  . Smoking status: Never Smoker   . Smokeless tobacco: Never Used  . Alcohol Use: 1.2 oz/week    2 Glasses of wine per week     Comment: week  . Drug Use: No  . Sexual Activity: Yes    Birth Control/ Protection: Surgical     Comment: HYST   Other Topics Concern  . Not on file   Social History Narrative     Ms. Cloward's family history includes Breast cancer (age of onset: 38) in her sister; Cancer in her maternal grandmother; Cystic fibrosis in her other; Diabetes in her brother and brother; Emphysema in her father. There is no history of Colon cancer, Esophageal cancer, Rectal cancer, or Stomach cancer.      Objective:    Filed Vitals:   08/27/14 1413  BP: 108/64  Pulse: 78    Physical Exam  well-developed white female in no acute distress, pleasant blood pressure 108/64 pulse 78 height 5 foot 1 weight 141. HEENT; nontraumatic normocephalic EOMI PERRLA sclera anicteric, Supple no JVD, Cardiovascular ;regular rate and rhythm with S1-S2 no murmur or gallop, Pulmonary ;clear bilaterally, Abdomen; soft basically nontender nondistended bowel sounds are active there is no palpable mass or hepatosplenomegaly, Rectal; exam not done, Ext; no clubbing cyanosis or edema skin warm and dry, Psych; mood and affect appropriate       Assessment & Plan:   #1 54 yo  female  with acute epigastric pain most consistent with gastritis- possibly NSAID / EtOH induced. Doubt pancreatitis or choledocholithiasis but we will rule out #2 history of duodenitis on EGD 2014 #3 colon neoplasia screening normal colonoscopy July 2014 ,no family history- so 10 year interval follow-up # 4 status post laparoscopic cholecystectomy 2014  Plan; check CBC with differential, hepatic panel and lipase Avoid NSAIDs Refill Prilosec 40 mg. She will take 40 mg twice daily for 1 week then once daily for 2 weeks then may stop if doing well. She says she prefers not to stay on anything on a daily basis for acid reflux and she hadn't been having any regular symptoms. She will be established with Dr. Carlean Purl per patient request    Amy Genia Harold PA-C 08/27/2014

## 2014-08-28 NOTE — Progress Notes (Signed)
Agree with Ms. Esterwood's assessment and plan. Aamna Mallozzi E. Achsah Mcquade, MD, FACG   

## 2014-09-03 ENCOUNTER — Other Ambulatory Visit: Payer: Self-pay

## 2014-09-03 DIAGNOSIS — R1013 Epigastric pain: Secondary | ICD-10-CM

## 2014-10-01 ENCOUNTER — Other Ambulatory Visit (INDEPENDENT_AMBULATORY_CARE_PROVIDER_SITE_OTHER): Payer: Managed Care, Other (non HMO)

## 2014-10-01 DIAGNOSIS — R1013 Epigastric pain: Secondary | ICD-10-CM

## 2014-10-01 LAB — CBC WITH DIFFERENTIAL/PLATELET
BASOS PCT: 0.2 % (ref 0.0–3.0)
Basophils Absolute: 0 10*3/uL (ref 0.0–0.1)
EOS ABS: 0.4 10*3/uL (ref 0.0–0.7)
Eosinophils Relative: 7.4 % — ABNORMAL HIGH (ref 0.0–5.0)
HEMATOCRIT: 40.1 % (ref 36.0–46.0)
Hemoglobin: 13.4 g/dL (ref 12.0–15.0)
LYMPHS ABS: 1.6 10*3/uL (ref 0.7–4.0)
Lymphocytes Relative: 26.2 % (ref 12.0–46.0)
MCHC: 33.4 g/dL (ref 30.0–36.0)
MCV: 82.4 fl (ref 78.0–100.0)
MONO ABS: 0.6 10*3/uL (ref 0.1–1.0)
Monocytes Relative: 9.4 % (ref 3.0–12.0)
NEUTROS PCT: 56.8 % (ref 43.0–77.0)
Neutro Abs: 3.4 10*3/uL (ref 1.4–7.7)
Platelets: 226 10*3/uL (ref 150.0–400.0)
RBC: 4.86 Mil/uL (ref 3.87–5.11)
RDW: 13.1 % (ref 11.5–15.5)
WBC: 6.1 10*3/uL (ref 4.0–10.5)

## 2014-10-01 LAB — HEPATIC FUNCTION PANEL
ALT: 30 U/L (ref 0–35)
AST: 20 U/L (ref 0–37)
Albumin: 4.3 g/dL (ref 3.5–5.2)
Alkaline Phosphatase: 124 U/L — ABNORMAL HIGH (ref 39–117)
Bilirubin, Direct: 0 mg/dL (ref 0.0–0.3)
TOTAL PROTEIN: 7.4 g/dL (ref 6.0–8.3)
Total Bilirubin: 0.3 mg/dL (ref 0.2–1.2)

## 2014-10-04 ENCOUNTER — Ambulatory Visit (INDEPENDENT_AMBULATORY_CARE_PROVIDER_SITE_OTHER): Payer: Managed Care, Other (non HMO)

## 2014-10-04 ENCOUNTER — Ambulatory Visit (INDEPENDENT_AMBULATORY_CARE_PROVIDER_SITE_OTHER): Payer: Managed Care, Other (non HMO) | Admitting: Family Medicine

## 2014-10-04 VITALS — BP 124/72 | HR 99 | Temp 98.1°F | Resp 18 | Ht 63.0 in | Wt 140.0 lb

## 2014-10-04 DIAGNOSIS — Z780 Asymptomatic menopausal state: Secondary | ICD-10-CM | POA: Insufficient documentation

## 2014-10-04 DIAGNOSIS — H1013 Acute atopic conjunctivitis, bilateral: Secondary | ICD-10-CM

## 2014-10-04 DIAGNOSIS — M25572 Pain in left ankle and joints of left foot: Secondary | ICD-10-CM

## 2014-10-04 DIAGNOSIS — M8580 Other specified disorders of bone density and structure, unspecified site: Secondary | ICD-10-CM | POA: Insufficient documentation

## 2014-10-04 DIAGNOSIS — M810 Age-related osteoporosis without current pathological fracture: Secondary | ICD-10-CM | POA: Insufficient documentation

## 2014-10-04 LAB — CALCIUM, IONIZED: CALCIUM ION: 1.26 mmol/L (ref 1.12–1.32)

## 2014-10-04 MED ORDER — OLOPATADINE HCL 0.1 % OP SOLN: 1.0000 [drp] | Freq: Two times a day (BID) | OPHTHALMIC | Status: DC

## 2014-10-04 NOTE — Progress Notes (Signed)
10/04/2014 at 10:48 AM  Yolanda [name] / DOB: 1960/09/02 / MRN: [id number]  The patient has DEPRESSION; RHINITIS, CHRONIC; IBS; FATIQUE AND MALAISE; CHEST PAIN; UNS ADVRS EFF UNS RX MEDICINAL&BIOLOGICAL SBSTNC; Depression; IC (interstitial cystitis); Pancreatic duct dilated; and Osteopenia on her problem list.  SUBJECTIVE  Chief compalaint: Follow-up and Eye Problem   History of present illness: Yolanda [name] is 54 y.o. well appearing female presenting for the reemergence of right sided foot pain s/p minimally displaced distal fifth metatarsal fracture.  She was recently diagnosed with osteopenia via bone scan.  Frax score indicated no treatment necessary.  Reemergence of the pain started 2 weeks ago, with worsening course since that time.  Associated symptoms include pain with ambulation and she denies bony tenderness. She has tried 2000 mg tylenol qd with fair to poor relief.  She also complains of bilateral eye irritation.  She purchased new makeup two days ago and her eyes began to bother her shortly after the first application.  She reports mild crusting and redness, and denies changes in vision.    She  has a past medical history of Depression; IC (interstitial cystitis); Allergy; Hiatal hernia; Esophagitis; Schatzki's ring; Osteopenia (05/2014 ); and GERD (gastroesophageal reflux disease).    She  has a current medication list which includes the following prescription(s): bupropion hcl, estrogens conj synthetic a, lorazepam, mometasone, and multivitamin.  Yolanda [name] has No Known Allergies. She  reports that she has never smoked. She has never used smokeless tobacco. She reports that she drinks about 1.2 oz of alcohol per week. She reports that she does not use illicit drugs. She  reports that she currently engages in sexual activity. She reports using the following method of birth control/protection: Surgical.  The patient  has past surgical history that includes TMJ Arthroscopy  (11-26-2001); Hysteroscopy (12/2003); Pelvic laparoscopy (12/2003); Cholecystectomy; Vaginal hysterectomy (12/05); and Laparoscopic bilateral salpingo oophorectomy (Bilateral, 07/02/2014).  Her family history includes Breast cancer (age of onset: 18) in her sister; Cancer in her maternal grandmother; Cystic fibrosis in her other; Diabetes in her brother and brother; Emphysema in her father. There is no history of Colon cancer, Esophageal cancer, Rectal cancer, or Stomach cancer.  Review of Systems  Constitutional: Negative.   HENT: Negative.   Eyes: Positive for discharge and redness. Negative for blurred vision, double vision, photophobia and pain.  Respiratory: Negative.  Negative for cough, shortness of breath and wheezing.   Cardiovascular: Negative.  Negative for chest pain and palpitations.  Genitourinary: Negative.   Musculoskeletal: Positive for myalgias and joint pain. Negative for falls.  Skin: Negative.   Neurological: Positive for tingling. Negative for dizziness.    OBJECTIVE  Her  height is 5\' 3"  (1.6 m) and weight is 140 lb (63.504 kg). Her oral temperature is 98.1 F (36.7 C). Her blood pressure is 124/72 and her pulse is 99. Her respiration is 18 and oxygen saturation is 99%.  The patient's body mass index is 24.81 kg/(m^2).  Physical Exam  Eyes: Pupils are equal, round, and reactive to light. Right eye exhibits no discharge. Left eye exhibits no discharge. Right conjunctiva is injected. Right conjunctiva has no hemorrhage. Left conjunctiva is injected. Left conjunctiva has no hemorrhage. Right eye exhibits normal extraocular motion. Left eye exhibits normal extraocular motion.   [facility] reading (PRIMARY) by  Dr. Lorelei Pont: Healed left 5th metatarsal fracture without evidence of refracture.    No results found for this or any previous visit (from the past 24 hour(s)).  ASSESSMENT & PLAN  Maud was seen today for follow-up and eye problem.  Diagnoses and all orders for this  visit:  Pain in joint, ankle and foot, left Orders: -     DG Foot Complete Right; Future -     Ambulatory referral to Physical Therapy  Osteopenia Orders: -     Vitamin D, 25-hydroxy -     Calcium, ionized  Allergic conjunctivitis, bilateral Orders: -     olopatadine (PATANOL) 0.1 % ophthalmic solution; Place 1 drop into both eyes 2 (two) times daily.    The patient was advised to call or come back to clinic if she does not see an improvement in symptoms, or worsens with the above plan.   Philis Fendt, MHS, PA-C [facility] 10/04/2014 10:48 AM

## 2014-10-06 LAB — VITAMIN D 25 HYDROXY (VIT D DEFICIENCY, FRACTURES): Vit D, 25-Hydroxy: 51 ng/mL (ref 30–100)

## 2014-10-22 ENCOUNTER — Encounter: Payer: Self-pay | Admitting: Gynecology

## 2014-11-10 ENCOUNTER — Telehealth: Payer: Self-pay | Admitting: *Deleted

## 2014-11-10 NOTE — Telephone Encounter (Signed)
-----   Message from Ramond Craver, [place] sent at 11/07/2014  1:02 PM EDT ----- Regarding: referral to genetic counselor Per Dr. Loetta Rough- I called patient in follow up of her mammogram report where they discussed her dense breasts and family history and recommended considering an MRI. I reviewed the options with the patient to include starting MRI screening of her breasts versus discussing her history with a genetic counselor and to get their input as to recommendations. I reviewed the whole issues as to cost of the MRI, how frequently to do them if we choose to do this in the issues of dense breast with possible increased risk of breast cancer and decreased detection on screening. Patient would prefer discussing with a genetic counselor and will make arrangements for this.       Schedule an appointment with [facility] genetic counselor reference dense breast with family history of breast canc  Anderson [place] -there is more info about her risk factor etc on her Mammogram report if that helps with making appt./ka)

## 2014-11-10 NOTE — Telephone Encounter (Signed)
Referral faxed to [facility], they will contact patient to schedule.

## 2014-11-17 ENCOUNTER — Telehealth: Payer: Self-pay | Admitting: Internal Medicine

## 2014-11-17 NOTE — Telephone Encounter (Signed)
She is improving so will conclude she had correct care

## 2014-11-17 NOTE — Telephone Encounter (Signed)
Patient calling to let us know she went to Urgent Care on Friday with abdominal pain. They did an xray and told her she had diverticulitis. She was given Cipro and Flagyl. She is feeling better and fever is gone but is still tender at area. She is going out of town tomorrow and wanted to be sure that they had done the right thing. Offered OV tomorrow but she declines this since she is feeling better. She will call back if symptom worsen.

## 2014-11-19 ENCOUNTER — Telehealth: Payer: Self-pay | Admitting: Genetic Counselor

## 2014-11-19 NOTE — Telephone Encounter (Signed)
Called pt to schedule gen counseling appt.  left message.  TG  Dx:  Dense breast and family history of breast cancer. Referring:  Dr. Phineas Real

## 2014-11-20 NOTE — Telephone Encounter (Signed)
Cancer center left message for pt to call. 

## 2014-11-24 ENCOUNTER — Telehealth: Payer: Self-pay | Admitting: Genetic Counselor

## 2014-11-24 NOTE — Telephone Encounter (Signed)
Patient called to make new patient gen counseling appt.    Yolanda [name] 12/03/14 9am

## 2014-11-24 NOTE — Telephone Encounter (Signed)
appointment 12/03/14 @ 9:00am

## 2014-12-03 ENCOUNTER — Ambulatory Visit (HOSPITAL_BASED_OUTPATIENT_CLINIC_OR_DEPARTMENT_OTHER): Payer: Managed Care, Other (non HMO) | Admitting: Genetic Counselor

## 2014-12-03 ENCOUNTER — Encounter: Payer: Self-pay | Admitting: Genetic Counselor

## 2014-12-03 ENCOUNTER — Other Ambulatory Visit: Payer: Managed Care, Other (non HMO)

## 2014-12-03 DIAGNOSIS — Z8049 Family history of malignant neoplasm of other genital organs: Secondary | ICD-10-CM

## 2014-12-03 DIAGNOSIS — Z315 Encounter for genetic counseling: Secondary | ICD-10-CM | POA: Diagnosis not present

## 2014-12-03 DIAGNOSIS — Z803 Family history of malignant neoplasm of breast: Secondary | ICD-10-CM | POA: Diagnosis not present

## 2014-12-03 NOTE — Progress Notes (Signed)
REFERRING PROVIDER: Tanda Rockers, MD [address] [place], [place] [address]   Yolanda Furlong, MD  PRIMARY PROVIDER:  Christinia Gully, MD  PRIMARY REASON FOR VISIT:  1. Family history of breast cancer   2. Family history of uterine cancer      HISTORY OF PRESENT ILLNESS:   Yolanda [name], a 54 y.o. female, was seen for a [facility] cancer genetics consultation at the request of Dr. Phineas Real due to a family history of cancer.  Yolanda [name] presents to clinic today to discuss the possibility of a hereditary predisposition to cancer, genetic testing, and to further clarify her future cancer risks, as well as potential cancer risks for family members. Yolanda [name] has no personal history of cancer.  She reportedly has very dense breast tissue and has been recalled from mulitiple mammograms for screening ultrasounds, but has never had a breast biopsy.  CANCER HISTORY:   No history exists.     HORMONAL RISK FACTORS:  Menarche was at age 83.  First live birth at age 77.  OCP use for approximately 0 years.  Ovaries intact: no.  Hysterectomy: yes.  Menopausal status: postmenopausal.  HRT use: 0 years. Colonoscopy: yes; normal. Mammogram within the last year: yes. Number of breast biopsies: 0. Up to date with pelvic exams:  yes. Any excessive radiation exposure in the past:  no  Past Medical History  Diagnosis Date  . Depression   . IC (interstitial cystitis)   . Allergy   . Hiatal hernia   . Esophagitis   . Schatzki's ring   . Osteopenia 05/2014     T score -1.6 FRAX 5.2%/0.5%  . GERD (gastroesophageal reflux disease)   . Family history of breast cancer   . Family history of uterine cancer     Past Surgical History  Procedure Laterality Date  . Tmj arthroscopy  11-26-2001  . Hysteroscopy  12/2003    RESECTON OF SUBMUCOUS  MYOMA/DX SCOPE  . Pelvic laparoscopy  12/2003    DX SCOPE/RESECTION OF SUBMUCOUS MYOMA  . Cholecystectomy    . Vaginal hysterectomy  12/05   Menorrhagia/dysmenorrhea. Pathology showed leiomyoma/adenomyosis  . Laparoscopic bilateral salpingo oopherectomy Bilateral 07/02/2014    Procedure: LAPAROSCOPIC BILATERAL SALPINGO OOPHORECTOMY;  Surgeon: Anastasio Auerbach, MD;  Location: [facility];  Service: Gynecology;  Laterality: Bilateral;    History   Social History  . Marital Status: Married    Spouse Name: N/A  . Number of Children: 2  . Years of Education: N/A   Social History Main Topics  . Smoking status: Never Smoker   . Smokeless tobacco: Never Used  . Alcohol Use: 1.2 oz/week    2 Glasses of wine per week     Comment: week  . Drug Use: No  . Sexual Activity: Yes    Birth Control/ Protection: Surgical     Comment: HYST   Other Topics Concern  . None   Social History Narrative     FAMILY HISTORY:  We obtained a detailed, 4-generation family history.  Significant diagnoses are listed below: Family History  Problem Relation Age of Onset  . Uterine cancer Maternal Grandmother     dx in her 69s  . Breast cancer Sister     paternal half sister dx in her early 85s  . Diabetes Brother 50    HALF BROTHER/HALF BROTHER   . Cystic fibrosis Other     brother's granddauther  . Colon cancer Neg Hx   . Esophageal cancer Neg  Hx   . Rectal cancer Neg Hx   . Stomach cancer Neg Hx   . Emphysema Father   . Breast cancer Sister     paternal half sister dx in her late 28s  . Cystic fibrosis Other     brothers granddaughters   Yolanda [name] has two daughters who are healthy.  She has one full brother who has two granddaughters with cystic fibrosis.  She has one maternal half brother who is healthy, four paternal half brothers who are healthy, two paternal half sisters who have had breast cancer, one in her late 32s the other in her 36s.  She also has a paternal half sister who did not have breast cancer but had a prophylactic mastectomy based on the family history.  Yolanda [name] mother is alive at 81.  She had a lumpectomy at age  62 that was benign.  Her mother had two brothers and a sister who have no known cancer.  Yolanda [name] maternal grandmother had uterine cancer in her 32s.  There is no other reported family history of cancer. Patient's maternal ancestors are of [place] descent, and paternal ancestors are of [place] descent. There is no reported Ashkenazi Jewish ancestry. There is no known consanguinity.  GENETIC COUNSELING ASSESSMENT: Yolanda [name] is a 54 y.o. female with a family history of cance which somewhat suggestive of a hereditary cancer syndrome and predisposition to cancer. We, therefore, discussed and recommended the following at today's visit.   DISCUSSION: We reviewed the characteristics, features and inheritance patterns of hereditary cancer syndromes. We reviewed the risk for hereditary breast cancer syndromes including BRCA mutations.  Yolanda [name] did not have a lot of information about her father's side of the family has he died when she was 24, and he was the youngest of five children.  She is unaware whether her sisters had genetic testing, and does not keep in close contact with them to know about this.  We also discussed genetic testing, including the appropriate family members to test, the process of testing, insurance coverage and turn-around-time for results. We discussed the implications of a negative, positive and/or variant of uncertain significant result. We recommended Yolanda [name] pursue genetic testing for the Breast/ovarian cancer gene panel. The Breast/Ovarian gene panel offered by [facility] includes sequencing and rearrangement analysis for the following 21 genes:  ATM, BARD1, BRCA1, BRCA2, BRIP1, CDH1, CHEK2, EPCAM, FANCC, MLH1, MSH2, MSH6, NBN, PALB2, PMS2, PTEN, RAD51C, RAD51D, STK11, TP53, and XRCC2.     In order to estimate her chance of having a BRCA mutation, we used statistical models (Tyrer Cusik, [place] II and myriad Risk) and laboratory data that take into account her personal  medical history, family history and ancestry.  Because each model is different, there can [name] a lot of variability in the risks they give.  Therefore, these numbers must [name] considered a rough range and not a precise risk of having a BRCA mutation.  These models estimate that she has approximately a 0.5-2.2% chance of having a mutation. Based on this assessment of her family as well as personal and family history, genetic testing is recommended.  Based on the patient's personal and family history, statistical models (Tyrer Cusik)  and literature data were used to estimate her risk of developing Breast cancer. These estimate her lifetime risk of developing breast cancer to [name] approximately 15.9%. This estimation does not take into account any genetic testing results.  The patient's lifetime breast cancer risk is a preliminary  estimate based on available information using one of several models endorsed by the [place] (ACS). The ACS recommends consideration of breast MRI screening as an adjunct to mammography for patients at high risk (defined as 20% or greater lifetime risk). A more detailed breast cancer risk assessment can [name] considered, if clinically indicated.   We also discussed the family history of cystic fibrosis (CF).  Cystic fibrosis is an autosomal recessive disease found primarily in individuals of [place] european descent.  Approximately 1 in 25 individuals of [place] european descent are carriers.  Based on Yolanda [name]'s family history, she has a 1 in 4 chance of being a carrier.  Genetic testing is available to her to learn if she is a carrier, however it is beyond the scope of our testing.  It would [name] helpful to have a copy of her great niece's genetic testing, so that when her testing is performed we can ensure that we are testing for the mutations that are found in her family, as well as others.  This could possibly [name] performed through Dr. Zelphia Cairo office, as many OB/GYN  offices perform CF testing on expecting mothers.  Alternatively, Yolanda [name]'s daughters could undergo genetic testing, as they have a 1 in 8 chance of being carriers for the mutation in their family, but also have a chance of carrying a mutation coming from their father, as he is Caucasian as well.  PLAN: After considering the risks, benefits, and limitations, Yolanda [name]  provided informed consent to pursue genetic testing and the blood sample was sent to [facility] for analysis of the Breast/Ovarian cancer panel. Results should [name] available within approximately 2-3 weeks' time, at which point they will [name] disclosed by telephone to Yolanda [name], as will any additional recommendations warranted by these results. Yolanda [name] will receive a summary of her genetic counseling visit and a copy of her results once available. This information will also [name] available in Epic. We encouraged Yolanda [name] to remain in contact with cancer genetics annually so that we can continuously update the family history and inform her of any changes in cancer genetics and testing that may [name] of benefit for her family. Yolanda [name] questions were answered to her satisfaction today. Our contact information was provided should additional questions or concerns arise.  Lastly, we encouraged Ms. Beissel to remain in contact with cancer genetics annually so that we can continuously update the family history and inform her of any changes in cancer genetics and testing that may [name] of benefit for this family.   Ms.  Paris questions were answered to her satisfaction today. Our contact information was provided should additional questions or concerns arise. Thank you for the referral and allowing [place] to share in the care of your patient.   Czarina Gingras P. Florene Glen, [place], [facility] Certified Genetic Counselor [email or web address] phone: [phone]  The patient was seen for a total of 60 minutes in face-to-face genetic counseling.  This  patient was discussed with Drs. Magrinat, Lindi Adie and/or Burr Medico who agrees with the above.    _______________________________________________________________________ For Office Staff:  Number of people involved in session: 1 Was an Intern/ student involved with case: yes

## 2014-12-21 DIAGNOSIS — Z1371 Encounter for nonprocreative screening for genetic disease carrier status: Secondary | ICD-10-CM

## 2014-12-21 HISTORY — DX: Encounter for nonprocreative screening for genetic disease carrier status: Z13.71

## 2014-12-23 ENCOUNTER — Encounter: Payer: Self-pay | Admitting: Genetic Counselor

## 2014-12-23 ENCOUNTER — Telehealth: Payer: Self-pay | Admitting: Genetic Counselor

## 2014-12-23 ENCOUNTER — Encounter: Payer: Self-pay | Admitting: Gynecology

## 2014-12-23 DIAGNOSIS — Z1379 Encounter for other screening for genetic and chromosomal anomalies: Secondary | ICD-10-CM | POA: Insufficient documentation

## 2014-12-23 DIAGNOSIS — Z803 Family history of malignant neoplasm of breast: Secondary | ICD-10-CM

## 2014-12-23 DIAGNOSIS — Z8049 Family history of malignant neoplasm of other genital organs: Secondary | ICD-10-CM

## 2014-12-23 NOTE — Progress Notes (Signed)
HPI: Ms. Hechavarria was previously seen in the [facility] clinic due to a family history of cancer and concerns regarding a hereditary predisposition to cancer. Please refer to our prior cancer genetics clinic note for more information regarding Ms. Shahin's medical, social and family histories, and our assessment and recommendations, at the time. Ms. Mcclafferty recent genetic test results were disclosed to her, as were recommendations warranted by these results. These results and recommendations are discussed in more detail below.  GENETIC TEST RESULTS: At the time of Ms. Nearhood's visit, we recommended she pursue genetic testing of the Breast/Ovarian cancer gene panel. The Breast/Ovarian gene panel offered by [facility] includes sequencing and rearrangement analysis for the following 21 genes:  ATM, BARD1, BRCA1, BRCA2, BRIP1, CDH1, CHEK2, EPCAM, FANCC, MLH1, MSH2, MSH6, NBN, PALB2, PMS2, PTEN, RAD51C, RAD51D, STK11, TP53, and XRCC2.  The report date is Dec 22, 2014.  Genetic testing was normal, and did not reveal a deleterious mutation in these genes. The test report has been scanned into EPIC and is located under the Media tab.   We discussed with Ms. Orsini that since the current genetic testing is not perfect, it is possible there may be a gene mutation in one of these genes that current testing cannot detect, but that chance is small. We also discussed, that it is possible that another gene that has not yet been discovered, or that we have not yet tested, is responsible for the cancer diagnoses in the family, and it is, therefore, important to remain in touch with cancer genetics in the future so that we can continue to offer Ms. Mander the most up to date genetic testing.   CANCER SCREENING RECOMMENDATIONS: This normal result is reassuring and indicates that Ms. Cumbie does not likely have an increased risk of cancer due to a mutation in one of these genes.  We, therefore, recommended   Ms. Moler continue to follow the cancer screening guidelines provided by her primary healthcare providers.   RECOMMENDATIONS FOR FAMILY MEMBERS: Women in this family might be at some increased risk of developing cancer, over the general population risk, simply due to the family history of cancer. We recommended women in this family have a yearly mammogram beginning at age 63, or 76 years younger than the earliest onset of cancer, an an annual clinical breast exam, and perform monthly breast self-exams. Women in this family should also have a gynecological exam as recommended by their primary provider. All family members should have a colonoscopy by age 85.  FOLLOW-UP: Lastly, we discussed with Ms. Flegal that cancer genetics is a rapidly advancing field and it is possible that new genetic tests will be appropriate for her and/or her family members in the future. We encouraged her to remain in contact with cancer genetics on an annual basis so we can update her personal and family histories and let her know of advances in cancer genetics that may benefit this family.   Our contact number was provided. Ms. Pickert questions were answered to her satisfaction, and she knows she is welcome to call us at anytime with additional questions or concerns.   Roma Kayser, MS, [facility] Certified Genetic Counselor [email or web address]

## 2014-12-23 NOTE — Telephone Encounter (Signed)
Revealed negative genetic testing on the Breast/Ovarian cancer panel.  Patient also received an EOB from her insurance stating that they do not find the test medically necessary.  She has spoken with the lab, and her husband had discussed the test cost with them as well. She is not sure what they decided upon.  I gave her the phone number to [facility] to find out what the [facility] cost will be.

## 2015-04-09 ENCOUNTER — Other Ambulatory Visit: Payer: Self-pay | Admitting: Internal Medicine

## 2015-04-09 DIAGNOSIS — J329 Chronic sinusitis, unspecified: Secondary | ICD-10-CM

## 2015-04-16 ENCOUNTER — Other Ambulatory Visit: Payer: Managed Care, Other (non HMO)

## 2015-07-22 ENCOUNTER — Ambulatory Visit (INDEPENDENT_AMBULATORY_CARE_PROVIDER_SITE_OTHER): Payer: Managed Care, Other (non HMO) | Admitting: Gynecology

## 2015-07-22 ENCOUNTER — Encounter: Payer: Self-pay | Admitting: Gynecology

## 2015-07-22 VITALS — BP 120/70 | Ht 61.0 in | Wt 143.0 lb

## 2015-07-22 DIAGNOSIS — Z79890 Hormone replacement therapy (postmenopausal): Secondary | ICD-10-CM | POA: Diagnosis not present

## 2015-07-22 DIAGNOSIS — Z23 Encounter for immunization: Secondary | ICD-10-CM

## 2015-07-22 DIAGNOSIS — Z01419 Encounter for gynecological examination (general) (routine) without abnormal findings: Secondary | ICD-10-CM

## 2015-07-22 DIAGNOSIS — M8580 Other specified disorders of bone density and structure, unspecified site: Secondary | ICD-10-CM | POA: Diagnosis not present

## 2015-07-22 NOTE — Addendum Note (Signed)
Addended by: Burnett Kanaris on: 07/22/2015 03:16 PM   Modules accepted: Orders

## 2015-07-22 NOTE — Progress Notes (Signed)
Yolanda [name] 02/25/1961 [id number]        54 y.o.  G2P2002  No LMP recorded. Patient has had a hysterectomy. for annual exam.  Several issues noted below.  Past medical history,surgical history, problem list, medications, allergies, family history and social history were all reviewed and documented as reviewed in the EPIC chart.  ROS:  Performed with pertinent positives and negatives included in the history, assessment and plan.   Additional significant findings :  none   Exam: [name] Vitals:   07/22/15 1145  BP: 120/70  Height: 5\' 1"  (1.549 m)  Weight: 143 lb (64.864 kg)   General appearance:  Normal affect, orientation and appearance. Skin: Grossly normal HEENT: Without gross lesions.  No cervical or supraclavicular adenopathy. Thyroid normal.  Lungs:  Clear without wheezing, rales or rhonchi Cardiac: RR, without RMG Abdominal:  Soft, nontender, without masses, guarding, rebound, organomegaly or hernia Breasts:  Examined lying and sitting without masses, retractions, discharge or axillary adenopathy. Pelvic:  Ext/BUS/vagina normal  Adnexa  Without masses or tenderness    Anus and perineum  Normal   Rectovaginal  Normal sphincter tone without palpated masses or tenderness.    Assessment/Plan:  54 y.o. [id number] female for annual exam.   1. HRT.  Patient is status post [facility] for menorrhagia dysmenorrhea 2005. Subsequent laparoscopic BSO 2015 for pain. Initially did well but subsequently has developed hot flashes and sweats. Has been seeing Dr. Sharol Roussel who has placed her on estradiol/estriol/progesterone cream and Prometrium after a battery of blood work. Patient has had good relief of her hot flushes. I discussed the whole HRT issue with her to include the WHI study and risks of stroke heart attack DVT and breast cancer. The issues of bioidentical versus pharmaceutical grade HRT with lack of studies to prove benefit over the other and lack of studies to prove  long-term safety reviewed. Also reviewed lack of evidence that progesterone supplementation in a patient status post hysterectomy of any benefit.  Patient wants to continue this regimen for now and will continue through Dr. Sharol Roussel. If she decide she wants to switch to an estrogen only and she will call. I discussed oral versus transdermal issues also. 2. Intermittent left pelvic pain. Patient was having this pain before BSO and this seems to have continued afterwards. It occurs every other month or so last for a week with cramping sharp stabbing pain. Dr. Sharol Roussel recommended she follow up with me and discussed with sounds like ovarian remnant syndrome with her. Reviewed pathology report which showed normal-appearing ovaries and pictures from the surgery after the salpingectomy which showed clear appearing sidewalls. I doubt ovarian remnant. I did suggest that when she's having this pain to come in and we'll do ultrasound at that time just to rule out any cystic or other changes in the adnexa. 3. Strong family history of breast cancer. Underwent genetic testing with negative genetic panel. Calculated lifetime risk of 15%. Is continuing with annual mammography.  Coming to this margin she'll schedule. SBE monthly reviewed. 4. Pap smear 2015. No Pap smear done today. No history of significant abnormal Pap smears. Options for stop screening as she is status post hysterectomy versus less frequent screening intervals reviewed. Will readdress on an annual basis. 5. Osteopenia. DEXA 2015 T score -1.6 FRAX 5%/0.5%. We'll repeat DEXA next year a two-year interval. 6. Colonoscopy 2014. Repeat at their recommended interval. 7. Health maintenance. No routine blood work done as patient reports this done at Dr. Yancey Flemings  office. Follow up one year, sooner if issues.   [name], 12:24 PM 07/22/2015

## 2015-07-22 NOTE — Patient Instructions (Signed)

## 2015-07-23 LAB — URINALYSIS W MICROSCOPIC + REFLEX CULTURE
Bacteria, UA: NONE SEEN [HPF]
Bilirubin Urine: NEGATIVE
CASTS: NONE SEEN [LPF]
Crystals: NONE SEEN [HPF]
GLUCOSE, UA: NEGATIVE
Hgb urine dipstick: NEGATIVE
Ketones, ur: NEGATIVE
LEUKOCYTES UA: NEGATIVE
NITRITE: NEGATIVE
Protein, ur: NEGATIVE
RBC / HPF: NONE SEEN RBC/HPF (ref <=2)
SPECIFIC GRAVITY, URINE: 1.016 (ref 1.001–1.035)
Squamous Epithelial / LPF: NONE SEEN [HPF] (ref <=5)
WBC, UA: NONE SEEN WBC/HPF (ref <=5)
Yeast: NONE SEEN [HPF]
pH: 6 (ref 5.0–8.0)

## 2015-11-05 ENCOUNTER — Encounter: Payer: Self-pay | Admitting: Gynecology

## 2016-07-25 ENCOUNTER — Ambulatory Visit (INDEPENDENT_AMBULATORY_CARE_PROVIDER_SITE_OTHER): Payer: Managed Care, Other (non HMO) | Admitting: Gynecology

## 2016-07-25 ENCOUNTER — Encounter: Payer: Self-pay | Admitting: Gynecology

## 2016-07-25 VITALS — BP 120/76 | Ht 61.5 in | Wt 140.0 lb

## 2016-07-25 DIAGNOSIS — Z13220 Encounter for screening for lipoid disorders: Secondary | ICD-10-CM

## 2016-07-25 DIAGNOSIS — M8580 Other specified disorders of bone density and structure, unspecified site: Secondary | ICD-10-CM | POA: Diagnosis not present

## 2016-07-25 DIAGNOSIS — Z1329 Encounter for screening for other suspected endocrine disorder: Secondary | ICD-10-CM

## 2016-07-25 DIAGNOSIS — Z1321 Encounter for screening for nutritional disorder: Secondary | ICD-10-CM

## 2016-07-25 DIAGNOSIS — Z79890 Hormone replacement therapy (postmenopausal): Secondary | ICD-10-CM

## 2016-07-25 DIAGNOSIS — Z23 Encounter for immunization: Secondary | ICD-10-CM | POA: Diagnosis not present

## 2016-07-25 DIAGNOSIS — Z01419 Encounter for gynecological examination (general) (routine) without abnormal findings: Secondary | ICD-10-CM

## 2016-07-25 DIAGNOSIS — J302 Other seasonal allergic rhinitis: Secondary | ICD-10-CM

## 2016-07-25 LAB — CBC WITH DIFFERENTIAL/PLATELET
BASOS ABS: 65 {cells}/uL (ref 0–200)
Basophils Relative: 1 %
Eosinophils Absolute: 650 cells/uL — ABNORMAL HIGH (ref 15–500)
Eosinophils Relative: 10 %
HEMATOCRIT: 39.6 % (ref 35.0–45.0)
Hemoglobin: 13.1 g/dL (ref 11.7–15.5)
LYMPHS PCT: 31 %
Lymphs Abs: 2015 cells/uL (ref 850–3900)
MCH: 27.9 pg (ref 27.0–33.0)
MCHC: 33.1 g/dL (ref 32.0–36.0)
MCV: 84.4 fL (ref 80.0–100.0)
MONO ABS: 715 {cells}/uL (ref 200–950)
MPV: 9.9 fL (ref 7.5–12.5)
Monocytes Relative: 11 %
NEUTROS PCT: 47 %
Neutro Abs: 3055 cells/uL (ref 1500–7800)
Platelets: 257 10*3/uL (ref 140–400)
RBC: 4.69 MIL/uL (ref 3.80–5.10)
RDW: 14.2 % (ref 11.0–15.0)
WBC: 6.5 10*3/uL (ref 3.8–10.8)

## 2016-07-25 LAB — LIPID PANEL
CHOL/HDL RATIO: 2.3 ratio (ref <5.0)
Cholesterol: 136 mg/dL (ref <200)
HDL: 59 mg/dL (ref >50)
LDL CALC: 63 mg/dL (ref <100)
TRIGLYCERIDES: 68 mg/dL (ref <150)
VLDL: 14 mg/dL (ref <30)

## 2016-07-25 LAB — COMPREHENSIVE METABOLIC PANEL
ALBUMIN: 4.3 g/dL (ref 3.6–5.1)
ALT: 23 U/L (ref 6–29)
AST: 20 U/L (ref 10–35)
Alkaline Phosphatase: 108 U/L (ref 33–130)
BUN: 15 mg/dL (ref 7–25)
CALCIUM: 9.3 mg/dL (ref 8.6–10.4)
CHLORIDE: 103 mmol/L (ref 98–110)
CO2: 28 mmol/L (ref 20–31)
Creat: 0.7 mg/dL (ref 0.50–1.05)
GLUCOSE: 88 mg/dL (ref 65–99)
POTASSIUM: 3.9 mmol/L (ref 3.5–5.3)
Sodium: 139 mmol/L (ref 135–146)
Total Bilirubin: 0.3 mg/dL (ref 0.2–1.2)
Total Protein: 7 g/dL (ref 6.1–8.1)

## 2016-07-25 LAB — TSH: TSH: 1.35 m[IU]/L

## 2016-07-25 MED ORDER — MOMETASONE FUROATE 50 MCG/ACT NA SUSP
2.0000 | Freq: Every day | NASAL | 5 refills | Status: DC
Start: 2016-07-25 — End: 2018-03-20

## 2016-07-25 NOTE — Addendum Note (Signed)
Addended by: Nelva Nay on: 07/25/2016 01:05 PM   Modules accepted: Orders

## 2016-07-25 NOTE — Patient Instructions (Signed)
Follow up for bone density as scheduled.  Call if you develop menopausal symptoms.  You may obtain a copy of any labs that were done today by logging onto MyChart as outlined in the instructions provided with your AVS (after visit summary). The office will not call with normal lab results but certainly if there are any significant abnormalities then we will contact you.   Health Maintenance Adopting a healthy lifestyle and getting preventive care can go a long way to promote health and wellness. Talk with your health care provider about what schedule of regular examinations is right for you. This is a good chance for you to check in with your provider about disease prevention and staying healthy. In between checkups, there are plenty of things you can do on your own. Experts have done a lot of research about which lifestyle changes and preventive measures are most likely to keep you healthy. Ask your health care provider for more information. WEIGHT AND DIET  Eat a healthy diet  Be sure to include plenty of vegetables, fruits, low-fat dairy products, and lean protein.  Do not eat a lot of foods high in solid fats, added sugars, or salt.  Get regular exercise. This is one of the most important things you can do for your health.  Most adults should exercise for at least 150 minutes each week. The exercise should increase your heart rate and make you sweat (moderate-intensity exercise).  Most adults should also do strengthening exercises at least twice a week. This is in addition to the moderate-intensity exercise.  Maintain a healthy weight  Body mass index (BMI) is a measurement that can be used to identify possible weight problems. It estimates body fat based on height and weight. Your health care provider can help determine your BMI and help you achieve or maintain a healthy weight.  For females 67 years of age and older:   A BMI below 18.5 is considered underweight.  A BMI of 18.5 to  24.9 is normal.  A BMI of 25 to 29.9 is considered overweight.  A BMI of 30 and above is considered obese.  Watch levels of cholesterol and blood lipids  You should start having your blood tested for lipids and cholesterol at 53 years of age, then have this test every 5 years.  You may need to have your cholesterol levels checked more often if:  Your lipid or cholesterol levels are high.  You are older than 53 years of age.  You are at high risk for heart disease.  CANCER SCREENING   Lung Cancer  Lung cancer screening is recommended for adults 27-54 years old who are at high risk for lung cancer because of a history of smoking.  A yearly low-dose CT scan of the lungs is recommended for people who:  Currently smoke.  Have quit within the past 15 years.  Have at least a 30-pack-year history of smoking. A pack year is smoking an average of one pack of cigarettes a day for 1 year.  Yearly screening should continue until it has been 15 years since you quit.  Yearly screening should stop if you develop a health problem that would prevent you from having lung cancer treatment.  Breast Cancer  Practice breast self-awareness. This means understanding how your breasts normally appear and feel.  It also means doing regular breast self-exams. Let your health care provider know about any changes, no matter how small.  If you are in your 20s or 30s,  you should have a clinical breast exam (CBE) by a health care provider every 1-3 years as part of a regular health exam.  If you are 58 or older, have a CBE every year. Also consider having a breast X-ray (mammogram) every year.  If you have a family history of breast cancer, talk to your health care provider about genetic screening.  If you are at high risk for breast cancer, talk to your health care provider about having an MRI and a mammogram every year.  Breast cancer gene (BRCA) assessment is recommended for women who have family  members with BRCA-related cancers. BRCA-related cancers include:  Breast.  Ovarian.  Tubal.  Peritoneal cancers.  Results of the assessment will determine the need for genetic counseling and BRCA1 and BRCA2 testing. Cervical Cancer Routine pelvic examinations to screen for cervical cancer are no longer recommended for nonpregnant women who are considered low risk for cancer of the pelvic organs (ovaries, uterus, and vagina) and who do not have symptoms. A pelvic examination may be necessary if you have symptoms including those associated with pelvic infections. Ask your health care provider if a screening pelvic exam is right for you.   The Pap test is the screening test for cervical cancer for women who are considered at risk.  If you had a hysterectomy for a problem that was not cancer or a condition that could lead to cancer, then you no longer need Pap tests.  If you are older than 65 years, and you have had normal Pap tests for the past 10 years, you no longer need to have Pap tests.  If you have had past treatment for cervical cancer or a condition that could lead to cancer, you need Pap tests and screening for cancer for at least 20 years after your treatment.  If you no longer get a Pap test, assess your risk factors if they change (such as having a new sexual partner). This can affect whether you should start being screened again.  Some women have medical problems that increase their chance of getting cervical cancer. If this is the case for you, your health care provider may recommend more frequent screening and Pap tests.  The human papillomavirus (HPV) test is another test that may be used for cervical cancer screening. The HPV test looks for the virus that can cause cell changes in the cervix. The cells collected during the Pap test can be tested for HPV.  The HPV test can be used to screen women 58 years of age and older. Getting tested for HPV can extend the interval  between normal Pap tests from three to five years.  An HPV test also should be used to screen women of any age who have unclear Pap test results.  After 53 years of age, women should have HPV testing as often as Pap tests.  Colorectal Cancer  This type of cancer can be detected and often prevented.  Routine colorectal cancer screening usually begins at 53 years of age and continues through 53 years of age.  Your health care provider may recommend screening at an earlier age if you have risk factors for colon cancer.  Your health care provider may also recommend using home test kits to check for hidden blood in the stool.  A small camera at the end of a tube can be used to examine your colon directly (sigmoidoscopy or colonoscopy). This is done to check for the earliest forms of colorectal cancer.  Routine  screening usually begins at age 50.  Direct examination of the colon should be repeated every 5-10 years through 53 years of age. However, you may need to be screened more often if early forms of precancerous polyps or small growths are found. Skin Cancer  Check your skin from head to toe regularly.  Tell your health care provider about any new moles or changes in moles, especially if there is a change in a mole's shape or color.  Also tell your health care provider if you have a mole that is larger than the size of a pencil eraser.  Always use sunscreen. Apply sunscreen liberally and repeatedly throughout the day.  Protect yourself by wearing long sleeves, pants, a wide-brimmed hat, and sunglasses whenever you are outside. HEART DISEASE, DIABETES, AND HIGH BLOOD PRESSURE   Have your blood pressure checked at least every 1-2 years. High blood pressure causes heart disease and increases the risk of stroke.  If you are between 55 years and 79 years old, ask your health care provider if you should take aspirin to prevent strokes.  Have regular diabetes screenings. This involves  taking a blood sample to check your fasting blood sugar level.  If you are at a normal weight and have a low risk for diabetes, have this test once every three years after 53 years of age.  If you are overweight and have a high risk for diabetes, consider being tested at a younger age or more often. PREVENTING INFECTION  Hepatitis B  If you have a higher risk for hepatitis B, you should be screened for this virus. You are considered at high risk for hepatitis B if:  You were born in a country where hepatitis B is common. Ask your health care provider which countries are considered high risk.  Your parents were born in a high-risk country, and you have not been immunized against hepatitis B (hepatitis B vaccine).  You have HIV or AIDS.  You use needles to inject street drugs.  You live with someone who has hepatitis B.  You have had sex with someone who has hepatitis B.  You get hemodialysis treatment.  You take certain medicines for conditions, including cancer, organ transplantation, and autoimmune conditions. Hepatitis C  Blood testing is recommended for:  Everyone born from 1945 through 1965.  Anyone with known risk factors for hepatitis C. Sexually transmitted infections (STIs)  You should be screened for sexually transmitted infections (STIs) including gonorrhea and chlamydia if:  You are sexually active and are younger than 53 years of age.  You are older than 53 years of age and your health care provider tells you that you are at risk for this type of infection.  Your sexual activity has changed since you were last screened and you are at an increased risk for chlamydia or gonorrhea. Ask your health care provider if you are at risk.  If you do not have HIV, but are at risk, it may be recommended that you take a prescription medicine daily to prevent HIV infection. This is called pre-exposure prophylaxis (PrEP). You are considered at risk if:  You are sexually active  and do not regularly use condoms or know the HIV status of your partner(s).  You take drugs by injection.  You are sexually active with a partner who has HIV. Talk with your health care provider about whether you are at high risk of being infected with HIV. If you choose to begin PrEP, you should first be   tested for HIV. You should then be tested every 3 months for as long as you are taking PrEP.  PREGNANCY   If you are premenopausal and you may become pregnant, ask your health care provider about preconception counseling.  If you may become pregnant, take 400 to 800 micrograms (mcg) of folic acid every day.  If you want to prevent pregnancy, talk to your health care provider about birth control (contraception). OSTEOPOROSIS AND MENOPAUSE   Osteoporosis is a disease in which the bones lose minerals and strength with aging. This can result in serious bone fractures. Your risk for osteoporosis can be identified using a bone density scan.  If you are 77 years of age or older, or if you are at risk for osteoporosis and fractures, ask your health care provider if you should be screened.  Ask your health care provider whether you should take a calcium or vitamin D supplement to lower your risk for osteoporosis.  Menopause may have certain physical symptoms and risks.  Hormone replacement therapy may reduce some of these symptoms and risks. Talk to your health care provider about whether hormone replacement therapy is right for you.  HOME CARE INSTRUCTIONS   Schedule regular health, dental, and eye exams.  Stay current with your immunizations.   Do not use any tobacco products including cigarettes, chewing tobacco, or electronic cigarettes.  If you are pregnant, do not drink alcohol.  If you are breastfeeding, limit how much and how often you drink alcohol.  Limit alcohol intake to no more than 1 drink per day for nonpregnant women. One drink equals 12 ounces of beer, 5 ounces of wine,  or 1 ounces of hard liquor.  Do not use street drugs.  Do not share needles.  Ask your health care provider for help if you need support or information about quitting drugs.  Tell your health care provider if you often feel depressed.  Tell your health care provider if you have ever been abused or do not feel safe at home. Document Released: 02/21/2011 Document Revised: 12/23/2013 Document Reviewed: 07/10/2013 [facility] Patient Information 2015 [place], [place]. This information is not intended to replace advice given to you by your health care provider. Make sure you discuss any questions you have with your health care provider.

## 2016-07-25 NOTE — Progress Notes (Signed)
    JHERI ROURKE 07/23/1961 [id number]        55 y.o.  G2P2002  for annual exam.    Past medical history,surgical history, problem list, medications, allergies, family history and social history were all reviewed and documented as reviewed in the EPIC chart.  ROS:  Performed with pertinent positives and negatives included in the history, assessment and plan.   Additional significant findings :  None   Exam: Caryn Bee assistant Vitals:   07/25/16 1103  BP: 120/76  Weight: 140 lb (63.5 kg)  Height: 5' 1.5" (1.562 m)   Body mass index is 26.02 kg/m.  General appearance:  Normal affect, orientation and appearance. Skin: Grossly normal HEENT: Without gross lesions.  No cervical or supraclavicular adenopathy. Thyroid normal.  Lungs:  Clear without wheezing, rales or rhonchi Cardiac: RR, without RMG Abdominal:  Soft, nontender, without masses, guarding, rebound, organomegaly or hernia Breasts:  Examined lying and sitting without masses, retractions, discharge or axillary adenopathy. Pelvic:  Ext, BUS, Vagina normal  Adnexa without masses or tenderness    Anus and perineum normal   Rectovaginal normal sphincter tone without palpated masses or tenderness.    Assessment/Plan:  55 y.o. [id number] female for annual exam.   1. Postmenopausal/HRT. Status post TVH for menorrhagia and dysmenorrhea 2005. Laparoscopic BSO 2015 for pain. Had been seeing Dr. Sharol Roussel and using a formulated regiment to include estrogen and Prometrium. Has not used the estrogen cream but continue on the Prometrium daily. Asked my opinion as far as HRT. I reviewed the NAMS 2017 guidelines with her. At this point she's having no symptoms such as hot flushes or sweats. She's going to stop her Prometrium see how she does. If she does develop hot flashes and sweats I discussed ERT with her and the various routes to include oral transdermal vaginal. First pass effect issues reviewed. If she does develop significant symptoms  then we will initiate an estrogen patch at her request. But at this point she wants to wait and see how she does without any hormones. Risks to include thrombosis such as stroke heart attack DVT and breast cancer you have all reviewed. 2. Osteopenia. DEXA 2015 T score -1.6 FRAX 5%/0.5%  Due for DEXA now she'll schedule follow up for this. 3. Strong family history of breast cancer. Underwent genetic testing panel which was negative. Calculated lifetime risk is 15%. Will continue with annual mammography which is coming due in March 2018. SBE monthly reviewed. 4. Pap smear 2015. No Pap smear done today. No history of significant abnormal Pap smears. Options to stop screening per current screening guidelines based on hysterectomy history discussed. Will readdress on annual basis. 5. Colonoscopy 2014. Repeat at their recommended interval. 6. Health maintenance. No longer has a primary physician. Requests baseline labs. CBC, CMP, lipid profile, urinalysis, TSH and vitamin D ordered. Nasonex refill provided at her request. Uses for seasonal allergies. Follow up in one year, sooner as needed.   [name], 11:24 AM 07/25/2016

## 2016-07-26 LAB — URINALYSIS W MICROSCOPIC + REFLEX CULTURE
BACTERIA UA: NONE SEEN [HPF]
BILIRUBIN URINE: NEGATIVE
CASTS: NONE SEEN [LPF]
CRYSTALS: NONE SEEN [HPF]
Glucose, UA: NEGATIVE
Hgb urine dipstick: NEGATIVE
KETONES UR: NEGATIVE
Leukocytes, UA: NEGATIVE
Nitrite: NEGATIVE
PROTEIN: NEGATIVE
RBC / HPF: NONE SEEN RBC/HPF (ref <=2)
SPECIFIC GRAVITY, URINE: 1.016 (ref 1.001–1.035)
Squamous Epithelial / LPF: NONE SEEN [HPF] (ref <=5)
WBC, UA: NONE SEEN WBC/HPF (ref <=5)
Yeast: NONE SEEN [HPF]
pH: 6 (ref 5.0–8.0)

## 2016-07-26 LAB — VITAMIN D 25 HYDROXY (VIT D DEFICIENCY, FRACTURES): VIT D 25 HYDROXY: 47 ng/mL (ref 30–100)

## 2016-10-20 DIAGNOSIS — M810 Age-related osteoporosis without current pathological fracture: Secondary | ICD-10-CM

## 2016-10-20 HISTORY — DX: Age-related osteoporosis without current pathological fracture: M81.0

## 2016-11-07 ENCOUNTER — Encounter: Payer: Self-pay | Admitting: Gynecology

## 2016-11-15 ENCOUNTER — Encounter: Payer: Self-pay | Admitting: Gynecology

## 2016-11-16 ENCOUNTER — Telehealth: Payer: Self-pay | Admitting: Gynecology

## 2016-11-16 ENCOUNTER — Encounter: Payer: Self-pay | Admitting: Gynecology

## 2016-11-16 DIAGNOSIS — M818 Other osteoporosis without current pathological fracture: Secondary | ICD-10-CM

## 2016-11-16 NOTE — Telephone Encounter (Signed)
Tell patient her most recent bone density at [facility] showed osteoporosis. A little surprising at her younger age. Recent blood work did show normal blood chemistries, vitamin D and thyroid. I would recommend a PTH level and a 24-hour urine collection for calcium excretion. Office visit after these collection results return to discuss results and treatment options

## 2016-11-17 NOTE — Telephone Encounter (Signed)
Left message for pt to call.

## 2016-11-17 NOTE — Telephone Encounter (Signed)
Pt informed, orders placed, coming on 12/05/16 @ 8:45am

## 2016-12-05 ENCOUNTER — Other Ambulatory Visit: Payer: Managed Care, Other (non HMO)

## 2016-12-05 DIAGNOSIS — M818 Other osteoporosis without current pathological fracture: Secondary | ICD-10-CM

## 2016-12-06 LAB — PTH, INTACT AND CALCIUM
Calcium: 9.1 mg/dL (ref 8.6–10.4)
PTH: 31 pg/mL (ref 14–64)

## 2016-12-07 ENCOUNTER — Other Ambulatory Visit: Payer: Managed Care, Other (non HMO)

## 2016-12-07 DIAGNOSIS — M818 Other osteoporosis without current pathological fracture: Secondary | ICD-10-CM

## 2016-12-08 LAB — CALCIUM, URINE, 24 HOUR
CALCIUM 24HR UR: 154 mg/(24.h) (ref 35–250)
CALCIUM UR: 14 mg/dL

## 2016-12-28 ENCOUNTER — Ambulatory Visit (INDEPENDENT_AMBULATORY_CARE_PROVIDER_SITE_OTHER): Payer: Managed Care, Other (non HMO) | Admitting: Gynecology

## 2016-12-28 ENCOUNTER — Encounter: Payer: Self-pay | Admitting: Gynecology

## 2016-12-28 VITALS — BP 116/74

## 2016-12-28 DIAGNOSIS — M810 Age-related osteoporosis without current pathological fracture: Secondary | ICD-10-CM

## 2016-12-28 MED ORDER — RISEDRONATE SODIUM 150 MG PO TABS: 150.0000 mg | ORAL_TABLET | ORAL | 12 refills | Status: DC

## 2016-12-28 NOTE — Patient Instructions (Signed)
Calcium Carbonate; Risedronate tablets What is this medicine? CALCIUM CARBONATE; RISEDRONATE (KAL see um KAR bon ate; ris ED roe nate) reduces calcium loss from bones. It is used to prevent and to treat osteoporosis. This medicine may be used for other purposes; ask your health care provider or pharmacist if you have questions. COMMON BRAND NAME(S): Actonel with Calcium What should I tell my health care provider before I take this medicine? They need to know if you have any of these conditions: -dental disease -esophageal, stomach, or intestinal problems, like acid reflux or GERD -hyperparathyroidism -kidney stones or disease -low or high blood calcium -problems sitting or standing for 30 minutes -trouble swallowing -an unusual or allergic reaction to calcium, risedronate, other bisphosphonates or medicines, foods, dyes, or preservatives -pregnant or trying to get pregnant -breast-feeding How should I use this medicine? Follow the directions on the prescription label. Take risedronate on the same day of the week, every week. Take calcium on the other 6 days of the week. On the day you take the risedronate: You must take this medicine exactly as directly or you will lower the amount of the medicine that you absorb into your body or you may cause yourself harm. Take this medicine by mouth first thing in the morning, after you are up for the day. Do not eat or drink anything before you take this medicine. Swallow the tablet with a full glass (6 to 8 ounces) of plain water. Do not take this medicine with any other drink. Do not chew or crush the tablet. After taking this medicine, do not eat breakfast, drink, or take any other medicines or vitamins for at least 30 minutes. Stand or sit up for at least 30 minutes after you take this medicine; do not lie down. Take this medicine on the same day every week. Do not take your medicine more often than directed. On the days you take the calcium: Take your  calcium tablet with food on the 6 days of the week that you do not take risedronate. Do not take other medicines at the same time as your calcium. Talk to your pediatrician regarding the use of this medicine in children. Special care may be needed. Overdosage: If you think you have taken too much of this medicine contact a poison control center or emergency room at once. NOTE: This medicine is only for you. Do not share this medicine with others. What if I miss a dose? If you miss a dose of risedronate, take the dose on the morning after you remember. Then take your next dose on your regular scheduled day of the week. Never take 2 tablets on the same day. Do not take double or extra doses. If you miss a dose of calcium, take it as soon as you remember with your next meal. Do not take 2 tablets with the same meal. Do not take calcium and risedronate tablets at the same time. What may interact with this medicine? Do not take this medicine with any of the following medications: -ammonium chloride -methenamine This medicine may also interact with the following medications: -antacids like aluminum hydroxide or magnesium hydroxide -antibiotics like ciprofloxacin, tetracycline and others -aspirin -calcium supplements -drugs for inflammation like ibuprofen, naproxen, and others -iron supplements -magnesium supplements -NSAIDs, medicines for pain and inflammation, like ibuprofen or naproxen -steroid medicines like prednisone or cortisone -thiazide diuretics -thyroid hormones -vitamins with minerals This list may not describe all possible interactions. Give your health care provider a list of all  the medicines, herbs, non-prescription drugs, or dietary supplements you use. Also tell them if you smoke, drink alcohol, or use illegal drugs. Some items may interact with your medicine. What should I watch for while using this medicine? Visit your doctor or health care professional for regular check ups.  It may be some time before you see the benefit from this medicine. Your doctor may order blood tests or other tests to see how you are doing. You should make sure that you get enough calcium and vitamin D while you are taking this medicine. Discuss the foods you eat and the vitamins you take with your health care professional. Some people who take this medicine have severe bone, joint, and/or muscle pain. This medicine may also increase your risk for a broken thigh bone. Tell your doctor right away if you have pain in your upper leg or groin. Tell your doctor if you have any pain that does not go away or that gets worse. What side effects may I notice from receiving this medicine? Side effects that you should report to your doctor or health care professional as soon as possible: -allergic reactions like skin rash, itching or hives, swelling of the face, lips, or tongue -breathing problems -black or tarry stools -changes in vision -heartburn or stomach pain -jaw pain, especially after dental work -pain or difficulty when swallowing -redness, blistering, peeling, or loosening of the skin, including inside the mouth -unusually weak or tired Side effects that usually do not require medical attention (report to your doctor or health care professional if they continue or are bothersome): -bone, muscle, or joint pain -changes in taste -diarrhea or constipation -eye pain or itching -headache -nausea or vomiting -stomach gas or fullness This list may not describe all possible side effects. Call your doctor for medical advice about side effects. You may report side effects to FDA at 1-800-FDA-1088. Where should I keep my medicine? Keep out of the reach of children. Store at room temperature between 15 and 30 degrees C (59 and 86 degrees F). Throw away any unused medicine after the expiration date. NOTE: This sheet is a summary. It may not cover all possible information. If you have questions about  this medicine, talk to your doctor, pharmacist, or health care provider.  2018 Elsevier/Gold Standard (2015-09-10 10:24:50)

## 2016-12-28 NOTE — Progress Notes (Signed)
    Yolanda [name] [birth year] [id number]        56 y.o.  G2P2002 presents to discuss her most recent bone density done at [facility] showing osteoporosis with a T score -2.6 at the right femoral neck And -2.5 at the left femoral neck. Osteopenia get other measured sites. Prior DEXA 2015 at [facility] with T score -1.5 at the right femoral neck.  Workup for secondary causes shows serum calcium 9.1, recent vitamin D 47, TSH normal, PTH 31 and 24 our urine calcium excretion 154 within the normal range.  She recently discontinued hormone replacement therapy. She has no overt risk factors such as steroid use or eating disorders. She does walk on a regular basis. She has a maternal history of osteopenia but no osteoporosis  Past medical history,surgical history, problem list, medications, allergies, family history and social history were all reviewed and documented in the EPIC chart.  Directed ROS with pertinent positives and negatives documented in the history of present illness/assessment and plan.  Exam: Vitals:   12/28/16 1222  BP: 116/74   General appearance:  Normal   Assessment/Plan:  56 y.o. O0H2122 with osteoporosis at both femoral necks.  Secondary workup negative. I reviewed the risks of osteoporosis to include increased fracture both short-term as well as continued loss and increasing risks of fracture in the future. Options for management were reviewed to include increasing exercise, supplementing vitamin D and calcium and following the bone density. Alternatives to include medication use now as well as referral to an endocrinologist such as Dr Cruzita Lederer also discussed and offered. I reviewed medication options to include reinitiation of HRT, bisphosphate such as alendronate, Actonel or Reclast. Prolia, Evista and Forteo also discussed. The pros and cons of each choice reviewed. The risks to include GERD, osteonecrosis of the jaw, atypical fractures, rashes and increased risk of  infections all reviewed. After lengthy discussion the patient wants to initiate Actonel 150 mg monthly, she will supplement with 1000 units vitamin D daily and 500-600 mg additional calcium as well as increasing weightbearing exercise. We will plan on repeating her bone density in 2 years. She will call me if she has any issues after initiation of the medication.  Greater than 50% of my time was spent in direct face to face counseling and coordination of care with the patient.    [name], 12:50 PM 12/28/2016

## 2017-08-11 ENCOUNTER — Encounter: Payer: Self-pay | Admitting: Internal Medicine

## 2017-08-17 ENCOUNTER — Ambulatory Visit (INDEPENDENT_AMBULATORY_CARE_PROVIDER_SITE_OTHER): Payer: Managed Care, Other (non HMO) | Admitting: Gynecology

## 2017-08-17 ENCOUNTER — Encounter: Payer: Self-pay | Admitting: Gynecology

## 2017-08-17 VITALS — BP 118/78 | Ht 61.0 in | Wt 142.0 lb

## 2017-08-17 DIAGNOSIS — Z01411 Encounter for gynecological examination (general) (routine) with abnormal findings: Secondary | ICD-10-CM

## 2017-08-17 DIAGNOSIS — M810 Age-related osteoporosis without current pathological fracture: Secondary | ICD-10-CM

## 2017-08-17 DIAGNOSIS — N952 Postmenopausal atrophic vaginitis: Secondary | ICD-10-CM | POA: Diagnosis not present

## 2017-08-17 NOTE — Progress Notes (Signed)
[id number]

## 2017-08-17 NOTE — Progress Notes (Signed)
    Yolanda [name] 12-14-1960 [id number]        56 y.o.  H7C1638 for annual gynecologic exam.  Notes some vaginal dryness with intercourse.  Otherwise doing well off HRT.  Recently started Actonel this past year for osteoporosis.  Doing well with this without side effects.  Past medical history,surgical history, problem list, medications, allergies, family history and social history were all reviewed and documented as reviewed in the EPIC chart.  ROS:  Performed with pertinent positives and negatives included in the history, assessment and plan.   Additional significant findings : None   Exam: Yolanda [name] assistant Vitals:   08/17/17 1102  BP: 118/78  Weight: 142 lb (64.4 kg)  Height: 5\' 1"  (1.549 m)   Body mass index is 26.83 kg/m.  General appearance:  Normal affect, orientation and appearance. Skin: Grossly normal HEENT: Without gross lesions.  No cervical or supraclavicular adenopathy. Thyroid normal.  Lungs:  Clear without wheezing, rales or rhonchi Cardiac: RR, without RMG Abdominal:  Soft, nontender, without masses, guarding, rebound, organomegaly or hernia Breasts:  Examined lying and sitting without masses, retractions, discharge or axillary adenopathy. Pelvic:  Ext, BUS, Vagina: Normal with mild atrophic changes  Adnexa: Without masses or tenderness    Anus and perineum: Normal   Rectovaginal: Normal sphincter tone without palpated masses or tenderness.    Assessment/Plan:  56 y.o. G31P2002 female for annual gynecologic exam with.   1. Postmenopausal/vaginal atrophy/dyspareunia.  Had been on HRT but discontinued.  Status post [facility] 2005 and subsequent laparoscopic BSO 2015 for pain.  Doing well without significant hot flushes or night sweats.  Is having some discomfort with intercourse.  Options for management were reviewed to include OTC lubricants as well as vaginal estrogen supplementation.  At this point the patient wants to try lubricants and will follow-up if her  times continue and she wants to pursue vaginal estrogen. 2. Osteoporosis.  DEXA 2018 T score -2.6.  Secondary workup is negative to include TSH PTH 24-hour urine calcium excretion and vitamin D level.  Started on Actonel 12/2016.  Is doing well with this.  We will continue her on this for a tentative 5-year course.  Will repeat her bone density at a 2-year interval. 3. Pap smear 2015.  Pap smear done today of vaginal cuff.  Reviewed options to stop screening per current screening guidelines based on hysterectomy history and no history of significant abnormal Pap smears.  Will readdress on an annual basis. 4. Mammography coming due in March and I reminded her to schedule this.  Breast exam normal today.  Strong family history of breast cancer.  Genetic testing panel was negative.  Calculated lifetime risk is 15%.  We will continue with annual mammography and SBE. 5. Colonoscopy 2014.  Repeat at their recommended interval. 6. Health maintenance.  No routine lab work done as this is done elsewhere.  Follow-up 1 year, sooner as needed.   Yolanda Auerbach MD, 11:28 AM 08/17/2017

## 2017-08-17 NOTE — Patient Instructions (Signed)
Follow-up in 1 year, sooner if any issues 

## 2017-08-19 LAB — PAP IG W/ RFLX HPV ASCU

## 2018-01-20 ENCOUNTER — Other Ambulatory Visit: Payer: Self-pay | Admitting: Gynecology

## 2018-03-08 ENCOUNTER — Telehealth: Payer: Self-pay

## 2018-03-08 ENCOUNTER — Ambulatory Visit: Payer: Managed Care, Other (non HMO) | Admitting: Family Medicine

## 2018-03-08 NOTE — Telephone Encounter (Signed)
Copied from [place] [phone]. Topic: Appointment Scheduling - Scheduling Inquiry for Clinic >> Mar 08, 2018 10:49 AM Synthia Innocent wrote: Reason for CRM: was scheduled for New Patient appt today w/ Dr Deborra Medina, provider out of office. She is requesting to be worked in, she has been waiting 4 months for this appt. Able to work in?

## 2018-03-08 NOTE — Telephone Encounter (Signed)
I LMOVM that I have scheduled her for 7.30.19 @ 8:15am and to please arrive at 8am/asked her to call back and change this if she is unable to make it for that date and time and we will work with her since she has been waiting for so long to get in/thx dmf

## 2018-03-20 ENCOUNTER — Encounter: Payer: Self-pay | Admitting: Family Medicine

## 2018-03-20 ENCOUNTER — Ambulatory Visit (INDEPENDENT_AMBULATORY_CARE_PROVIDER_SITE_OTHER): Payer: Managed Care, Other (non HMO) | Admitting: Family Medicine

## 2018-03-20 VITALS — BP 110/78 | HR 87 | Temp 98.7°F | Ht 61.5 in | Wt 142.0 lb

## 2018-03-20 DIAGNOSIS — J310 Chronic rhinitis: Secondary | ICD-10-CM

## 2018-03-20 DIAGNOSIS — Z23 Encounter for immunization: Secondary | ICD-10-CM

## 2018-03-20 DIAGNOSIS — R21 Rash and other nonspecific skin eruption: Secondary | ICD-10-CM

## 2018-03-20 DIAGNOSIS — M810 Age-related osteoporosis without current pathological fracture: Secondary | ICD-10-CM

## 2018-03-20 DIAGNOSIS — K582 Mixed irritable bowel syndrome: Secondary | ICD-10-CM | POA: Diagnosis not present

## 2018-03-20 DIAGNOSIS — F339 Major depressive disorder, recurrent, unspecified: Secondary | ICD-10-CM | POA: Diagnosis not present

## 2018-03-20 DIAGNOSIS — R0602 Shortness of breath: Secondary | ICD-10-CM

## 2018-03-20 DIAGNOSIS — Z114 Encounter for screening for human immunodeficiency virus [HIV]: Secondary | ICD-10-CM

## 2018-03-20 DIAGNOSIS — Z0000 Encounter for general adult medical examination without abnormal findings: Secondary | ICD-10-CM | POA: Insufficient documentation

## 2018-03-20 DIAGNOSIS — Z1159 Encounter for screening for other viral diseases: Secondary | ICD-10-CM

## 2018-03-20 DIAGNOSIS — Z91018 Allergy to other foods: Secondary | ICD-10-CM

## 2018-03-20 LAB — COMPREHENSIVE METABOLIC PANEL
ALK PHOS: 91 U/L (ref 39–117)
ALT: 28 U/L (ref 0–35)
AST: 30 U/L (ref 0–37)
Albumin: 4.5 g/dL (ref 3.5–5.2)
BUN: 16 mg/dL (ref 6–23)
CO2: 31 mEq/L (ref 19–32)
Calcium: 10.3 mg/dL (ref 8.4–10.5)
Chloride: 102 mEq/L (ref 96–112)
Creatinine, Ser: 0.76 mg/dL (ref 0.40–1.20)
GFR: 84.03 mL/min (ref >60.00)
GLUCOSE: 92 mg/dL (ref 70–99)
POTASSIUM: 4 meq/L (ref 3.5–5.1)
SODIUM: 141 meq/L (ref 135–145)
TOTAL PROTEIN: 7.8 g/dL (ref 6.0–8.3)
Total Bilirubin: 0.4 mg/dL (ref 0.2–1.2)

## 2018-03-20 LAB — LIPID PANEL
Cholesterol: 126 mg/dL (ref 0–200)
HDL: 57.6 mg/dL (ref >39.00)
LDL CALC: 55 mg/dL (ref 0–99)
NONHDL: 68.43
Total CHOL/HDL Ratio: 2
Triglycerides: 69 mg/dL (ref 0.0–149.0)
VLDL: 13.8 mg/dL (ref 0.0–40.0)

## 2018-03-20 LAB — CBC WITH DIFFERENTIAL/PLATELET
BASOS ABS: 0.1 10*3/uL (ref 0.0–0.1)
BASOS PCT: 0.9 % (ref 0.0–3.0)
EOS ABS: 0.6 10*3/uL (ref 0.0–0.7)
Eosinophils Relative: 9.4 % — ABNORMAL HIGH (ref 0.0–5.0)
HCT: 38.2 % (ref 36.0–46.0)
Hemoglobin: 12.9 g/dL (ref 12.0–15.0)
LYMPHS ABS: 1.7 10*3/uL (ref 0.7–4.0)
Lymphocytes Relative: 25.4 % (ref 12.0–46.0)
MCHC: 33.7 g/dL (ref 30.0–36.0)
MCV: 84.2 fl (ref 78.0–100.0)
Monocytes Absolute: 0.6 10*3/uL (ref 0.1–1.0)
Monocytes Relative: 8.7 % (ref 3.0–12.0)
NEUTROS ABS: 3.7 10*3/uL (ref 1.4–7.7)
NEUTROS PCT: 55.6 % (ref 43.0–77.0)
PLATELETS: 215 10*3/uL (ref 150.0–400.0)
RBC: 4.53 Mil/uL (ref 3.87–5.11)
RDW: 14.1 % (ref 11.5–15.5)
WBC: 6.6 10*3/uL (ref 4.0–10.5)

## 2018-03-20 LAB — TSH: TSH: 1.31 u[IU]/mL (ref 0.35–4.50)

## 2018-03-20 MED ORDER — MONTELUKAST SODIUM 10 MG PO TABS: 10.0000 mg | ORAL_TABLET | Freq: Every day | ORAL | 3 refills | Status: DC

## 2018-03-20 MED ORDER — EPINEPHRINE 0.3 MG/0.3ML IJ SOAJ: 0.3000 mg | Freq: Once | INTRAMUSCULAR | 2 refills | Status: AC

## 2018-03-20 MED ORDER — MOMETASONE FUROATE 50 MCG/ACT NA SUSP: 2.0000 | Freq: Every day | NASAL | 5 refills | Status: DC

## 2018-03-20 NOTE — Addendum Note (Signed)
Addended by: Lucille Passy on: 03/20/2018 09:23 AM   Modules accepted: Level of Service

## 2018-03-20 NOTE — Assessment & Plan Note (Addendum)
New- persistent.  Does not seem cardiac in nature.  Does seem like it is worsened with her allergies.  Start with singulair once daily.  Consider adding albuterol as needed prior to exercise. Follow up in one month. The patient indicates understanding of these issues and agrees with the plan. Check CBC and TSH today as  Well.

## 2018-03-20 NOTE — Assessment & Plan Note (Signed)
Taking monthly actonel along with daily Vit D and calcium. Doing weight bearing exercises as well. Due for repeat DEXA 10/2018.

## 2018-03-20 NOTE — Assessment & Plan Note (Signed)
Followed by Janet Berlin, takes ativan.  Feels symptoms are controlled.

## 2018-03-20 NOTE — Assessment & Plan Note (Signed)
New anaphylactic symptoms with honey injection. eRx sent for epi pen to use as needed. Refer to allergest.

## 2018-03-20 NOTE — Progress Notes (Addendum)
Subjective:   Patient ID: Yolanda [name], female    DOB: 1960-11-12, 57 y.o.   MRN: [id number]  Yolanda [name] is a pleasant 57 y.o. year old female who presents to clinic today with New Patient (Initial Visit) (Patient is here today to establish care.  GYN by Dr. Phineas Real on 12.27.19.  Mammogram completed on 3.19.18 with next due on 3.19.2020.  Colonoscopy is UTD.  She agrees to get Tdap today. She is not fasting.  She states that she has had 2 BMD scans and last year was told 3.2018 that she has Osteoporosis. She is taking Ca-with D and once monthly Actonel and doing light weight baring exercises 3 times weekly.  She sees Eino Farber at [facility]. Goes to [facility] 12.21.18 was last exam.  ); GI Problem (Patient would like to discuss getting a referral for Allergist.  About 10-years-ago she was seen and tested by Allergist.  She goes yearly to dermatology who states that her issues with skin on her elbows bilaterally are "Classic gluten intolerance" which she had already applied a gluten strict diet. She has suffered since her 35's with chronic GI issues. Agrees with Hep-C and HIV labs.); and Breathing Problem (States that sometimes feels that cannot get a good deep breath.  Wonders if it is exercise enduced asthma as it happens more now that she exercises more frequently.)  on 03/20/2018  HPI:  Health Maintenance  Topic Date Due  . Hepatitis C Screening  [birth year]  . HIV Screening  05/25/1978  . TETANUS/TDAP  05/25/1982  . INFLUENZA VACCINE  03/22/2018  . MAMMOGRAM  11/08/2018  . PAP SMEAR  08/17/2020  . COLONOSCOPY  03/21/2023   Osteoporosis- diagnosed on DEXA in 10/2016. Currently taking monthly Actonel and Calcium with Vitamin D.  Also doing light weight bearing exercises 3 times weekly.  Depression- sees Eino Farber at [facility].  Has GYN- Dr. Phineas Real. Last saw him for screening on 08/17/17.  Note reviewed- s/p hysterectomy.  GI issues- says she would like to get a referral to  see an allergies.  She sees a dermatologist who told her skin issues on her elbows are consistent with "gluten intolerance." Celiac was already ruled and she keeps a gluten free diet. Has had GI "issues" since her 57s. Colonoscopy 03/20/13 Sharlett Iles). Has been diagnosed with IBS in past.  Had a severe reaction to honey recently- she knew it would cause abdominal cramping in past but when there was honey in some salad dressing she ate recently, she actually felt like her throat would swell shut.  Husband gave her benadryl, she vomited and felt better.  Has never had a sensation like that.  Typically her allergy symptoms are either nasal or GI.    SOB-for SOB, particularly with exercise.  Feels she cannot get a deep breath. Feels she can't get a deep breath.  Chest feels tight but not chest pain.once she takes a deep breath, she feels better.  She feels it is allergy related.   Current Outpatient Medications on File Prior to Visit  Medication Sig Dispense Refill  . APLENZIN 174 MG TB24 Take 1 tablet by mouth daily.  0  . Calcium Carbonate (CALCIUM 600 PO) Take 1 tablet by mouth daily.    Marland Kitchen DIGESTIVE ENZYMES PO Take by mouth. Primary Digest Gastro-Digestive Enzyme take 1 before each meal    . hyoscyamine (LEVSIN SL) 0.125 MG SL tablet Place 0.125 mg under the tongue every 4 (four) hours as  needed.    Marland Kitchen LORazepam (ATIVAN) 0.5 MG tablet Take 0.5 mg by mouth daily as needed for sleep.     Marland Kitchen MAGNESIUM GLYCINATE PLUS PO Take 2 capsules by mouth at bedtime.    . Multiple Vitamin (MULTIVITAMIN) tablet Take 1 tablet by mouth daily.      Marland Kitchen olopatadine (PATANOL) 0.1 % ophthalmic solution Place 1 drop into both eyes 2 (two) times daily. 5 mL 12  . Omega-3 Fatty Acids (OMEGAPURE 780 EC PO) Take 1 tablet by mouth daily.    . risedronate (ACTONEL) 150 MG tablet TAKE ONE TABLET(150MG) BY MOUTH EVERY 30 DAYS WITH WATER ON EMPTY STOMAACH NOTHING BY MOUTH AND DON'T LIE DOWN FOR 30 MINUTES 1 tablet 6  .  triamcinolone cream (KENALOG) 0.1 % APP EXT AA BID FOR 14 DAYS  1   No current facility-administered medications on file prior to visit.     Allergies  Allergen Reactions  . Honey Bee Treatment [Bee Venom] Anaphylaxis  . Eggs Or Egg-Derived Products     Stomach ache  . Wheat Bran     Intolerance     Past Medical History:  Diagnosis Date  . Allergy   . BRCA negative 12/2014  . Depression   . Esophagitis   . GERD (gastroesophageal reflux disease)   . Hiatal hernia   . History of diverticulitis   . IC (interstitial cystitis)   . Osteoporosis 10/2016   T score -2.6  . Schatzki's ring     Past Surgical History:  Procedure Laterality Date  . CHOLECYSTECTOMY    . HYSTEROSCOPY  12/2003   RESECTON OF SUBMUCOUS  MYOMA/DX SCOPE  . LAPAROSCOPIC BILATERAL SALPINGO OOPHERECTOMY Bilateral 07/02/2014   Procedure: LAPAROSCOPIC BILATERAL SALPINGO OOPHORECTOMY;  Surgeon: Anastasio Auerbach, MD;  Location: [facility];  Service: Gynecology;  Laterality: Bilateral;  . PELVIC LAPAROSCOPY  12/2003   DX SCOPE/RESECTION OF SUBMUCOUS MYOMA  . TMJ ARTHROSCOPY  11-26-2001  . VAGINAL HYSTERECTOMY  12/05   Menorrhagia/dysmenorrhea. Pathology showed leiomyoma/adenomyosis    Family History  Problem Relation Age of Onset  . Uterine cancer Maternal Grandmother        dx in her 16s  . Breast cancer Sister        paternal half sister dx in her early 2s  . Cystic fibrosis Other        brother's granddauther  . Emphysema Father   . Early death Father   . Breast cancer Sister        paternal half sister dx in her late 61s  . Cystic fibrosis Other        brothers granddaughters  . Depression Mother   . Hyperlipidemia Mother   . Hypertension Mother   . Mental illness Mother   . Miscarriages / [place] Mother   . Diabetes Brother 50       HALF BROTHER/HALF BROTHER   . Breast cancer Sister   . GI problems Brother   . Colon cancer Neg Hx   . Esophageal cancer Neg Hx   . Rectal cancer Neg Hx     . Stomach cancer Neg Hx     Social History   Socioeconomic History  . Marital status: Married    Spouse name: Not on file  . Number of children: 2  . Years of education: Not on file  . Highest education level: Not on file  Occupational History  . Not on file  Social Needs  . Financial resource strain: Not on file  .  Food insecurity:    Worry: Not on file    Inability: Not on file  . Transportation needs:    Medical: Not on file    Non-medical: Not on file  Tobacco Use  . Smoking status: Never Smoker  . Smokeless tobacco: Never Used  Substance and Sexual Activity  . Alcohol use: Yes    Alcohol/week: 1.2 oz    Types: 2 Glasses of wine per week    Comment: week  . Drug use: No  . Sexual activity: Yes    Birth control/protection: Surgical    Comment: HYST-1st intercourse 57 yo-Fewer than 5 partners  Lifestyle  . Physical activity:    Days per week: Not on file    Minutes per session: Not on file  . Stress: Not on file  Relationships  . Social connections:    Talks on phone: Not on file    Gets together: Not on file    Attends religious service: Not on file    Active member of club or organization: Not on file    Attends meetings of clubs or organizations: Not on file    Relationship status: Not on file  . Intimate partner violence:    Fear of current or ex partner: Not on file    Emotionally abused: Not on file    Physically abused: Not on file    Forced sexual activity: Not on file  Other Topics Concern  . Not on file  Social History Narrative  . Not on file   The PMH, PSH, Social History, Family History, Medications, and allergies have been reviewed in [facility], and have been updated if relevant.   Review of Systems  Constitutional: Negative.   HENT: Negative.   Eyes: Negative.   Respiratory: Positive for chest tightness, shortness of breath and wheezing.   Cardiovascular: Negative.   Gastrointestinal: Positive for abdominal pain.  Endocrine: Negative.    Genitourinary: Negative.   Musculoskeletal: Negative.   Skin: Positive for rash.  Allergic/Immunologic: Positive for environmental allergies and food allergies.  Neurological: Negative.   Hematological: Negative.   Psychiatric/Behavioral: Negative.   All other systems reviewed and are negative.      Objective:    BP 110/78 (BP Location: Left Arm, Patient Position: Sitting, Cuff Size: Normal)   Pulse 87   Temp 98.7 F (37.1 C) (Oral)   Ht 5' 1.5" (1.562 m)   Wt 142 lb (64.4 kg)   SpO2 97%   BMI 26.40 kg/m    Physical Exam   General:  Well-developed,well-nourished,in no acute distress; alert,appropriate and cooperative throughout examination Head:  normocephalic and atraumatic.   Eyes:  vision grossly intact, PERRL Ears:  R ear normal and L ear normal externally, TMs clear bilaterally Nose:  no external deformity.   Mouth:  good dentition.   Neck:  No deformities, masses, or tenderness noted. Lungs:  Normal respiratory effort, chest expands symmetrically. Lungs are clear to auscultation, no crackles or wheezes. Heart:  Normal rate and regular rhythm. S1 and S2 normal without gallop, murmur, click, rub or other extra sounds. Msk:  No deformity or scoliosis noted of thoracic or lumbar spine.   Extremities:  No clubbing, cyanosis, edema, or deformity noted with normal full range of motion of all joints.   Neurologic:  alert & oriented X3 and gait normal.   Skin: nodular rash on elbows Cervical Nodes:  No lymphadenopathy noted Axillary Nodes:  No palpable lymphadenopathy Psych:  Cognition and judgment appear intact. Alert and cooperative  with normal attention span and concentration. No apparent delusions, illusions, hallucinations       Assessment & Plan:   Depression, recurrent ([place]) - Plan: Comprehensive metabolic panel, TSH  Osteoporosis, unspecified osteoporosis type, unspecified pathological fracture presence - Plan: CBC with Differential/Platelet, Comprehensive  metabolic panel, Lipid panel  RHINITIS, CHRONIC  Irritable bowel syndrome with both constipation and diarrhea - Plan: CANCELED: Gliadin antibodies, serum, CANCELED: Tissue transglutaminase, IgA, CANCELED: Reticulin Antibody, IgA w reflex titer  Need for Tdap vaccination - Plan: Tdap vaccine greater than or equal to 7yo IM  Need for hepatitis C screening test - Plan: Hepatitis C Antibody  Screening for HIV (human immunodeficiency virus) - Plan: HIV antibody (with reflex)  Food allergy - Plan: Ambulatory referral to Allergy  SOB (shortness of breath) on exertion No follow-ups on file.

## 2018-03-20 NOTE — Patient Instructions (Addendum)
Great to meet you. I will call you with your lab results from today and you can view them online.   We are adding singulair daily. Please update me in about a month- schedule a complete physical one month from now.  We will call you with an a referral to an allergist.

## 2018-03-20 NOTE — Assessment & Plan Note (Signed)
Followed by derm.  She is on antihistamine for this.

## 2018-03-20 NOTE — Assessment & Plan Note (Signed)
>>  ASSESSMENT AND PLAN FOR DEPRESSION, MAJOR, SINGLE EPISODE, COMPLETE REMISSION (HCC) WRITTEN ON 03/20/2018  9:18 AM BY Dianne Dun, MD  Followed by Glenna Fellows, takes ativan.  Feels symptoms are controlled.

## 2018-03-21 LAB — HEPATITIS C ANTIBODY
Hepatitis C Ab: NONREACTIVE
SIGNAL TO CUT-OFF: 0.01 (ref <1.00)

## 2018-03-21 LAB — HIV ANTIBODY (ROUTINE TESTING W REFLEX): HIV: NONREACTIVE

## 2018-04-18 ENCOUNTER — Encounter: Payer: Self-pay | Admitting: Allergy and Immunology

## 2018-04-18 ENCOUNTER — Ambulatory Visit (INDEPENDENT_AMBULATORY_CARE_PROVIDER_SITE_OTHER): Payer: Managed Care, Other (non HMO) | Admitting: Allergy and Immunology

## 2018-04-18 VITALS — BP 106/60 | HR 72 | Temp 98.4°F | Resp 16 | Ht 61.0 in | Wt 146.6 lb

## 2018-04-18 DIAGNOSIS — D721 Eosinophilia, unspecified: Secondary | ICD-10-CM

## 2018-04-18 DIAGNOSIS — J4520 Mild intermittent asthma, uncomplicated: Secondary | ICD-10-CM

## 2018-04-18 DIAGNOSIS — T781XXA Other adverse food reactions, not elsewhere classified, initial encounter: Secondary | ICD-10-CM

## 2018-04-18 DIAGNOSIS — T7800XA Anaphylactic reaction due to unspecified food, initial encounter: Secondary | ICD-10-CM | POA: Diagnosis not present

## 2018-04-18 DIAGNOSIS — J301 Allergic rhinitis due to pollen: Secondary | ICD-10-CM | POA: Diagnosis not present

## 2018-04-18 DIAGNOSIS — L308 Other specified dermatitis: Secondary | ICD-10-CM

## 2018-04-18 DIAGNOSIS — T7819XA Other adverse food reactions, not elsewhere classified, initial encounter: Secondary | ICD-10-CM

## 2018-04-18 DIAGNOSIS — R1084 Generalized abdominal pain: Secondary | ICD-10-CM

## 2018-04-18 DIAGNOSIS — L989 Disorder of the skin and subcutaneous tissue, unspecified: Secondary | ICD-10-CM

## 2018-04-18 DIAGNOSIS — R140 Abdominal distension (gaseous): Secondary | ICD-10-CM

## 2018-04-18 MED ORDER — ALBUTEROL SULFATE HFA 108 (90 BASE) MCG/ACT IN AERS: 2.0000 | INHALATION_SPRAY | Freq: Four times a day (QID) | RESPIRATORY_TRACT | 2 refills | Status: DC | PRN

## 2018-04-18 MED ORDER — RANITIDINE HCL 300 MG PO TABS: 300.0000 mg | ORAL_TABLET | Freq: Every day | ORAL | 1 refills | Status: DC

## 2018-04-18 NOTE — Progress Notes (Signed)
Dear Dr. Deborra Medina,  Thank you for referring [name] to the [facility] on 04/18/2018.   Below is a summation of this patient's evaluation and recommendations.  Thank you for your referral. I will keep you informed about this patient's response to treatment.   If you have any questions please do not hesitate to contact me.   Sincerely,  Jiles Prows, MD Allergy / Immunology [facility]   ______________________________________________________________________    NEW PATIENT NOTE  Referring Provider: Lucille Passy, MD Primary Provider: Lucille Passy, MD Date of office visit: 04/18/2018    Subjective:   Chief Complaint:  Yolanda [name] (DOB: 1961-06-29) is a 57 y.o. female who presents to the clinic on 04/18/2018 with a chief complaint of Allergic Reaction (good sensitivity to honey?) .     HPI: Vicente Males presents to this clinic in evaluation of a multitude of different problems.  First, she states that she has food intolerances.  She has had a multi-decade problem with eating specific foods and giving her cramping and bloating and diarrhea.  She has had extensive evaluation with a gastroenterologist in the past concerning this issue.  She went to integrative medicine about 3 years ago and she has been wheat free and egg white during that timeframe which has helped her issue significantly.  However, she still has cramping without diarrhea whenever she eats raw fruit and vegetables but does well with cooked fruit and vegetables or when she eats nuts especially almonds and macadamia and poppyseed and pine nuts.  It should be noted that she also started multiple supplements at that point in time and she is not really sure if her improvement comes about because of food avoidance measures or supplement use.  She does not have any classic reflux symptoms.  She drinks 2 teas per day and has chocolate  about 3 times per week.  Occasionally she will eat wheat and egg products when she eats out at a restaurant.  She does have some symptoms develop with her GI track when eating these foods.  She tells me that she has a brother who has very similar issues with abdominal pain and swelling after eating specific foods.  Second, she had a very severe reaction approximately 2 weeks ago with her abdomen.  She ate a salad dressing consisting of honey, poppy seeds, orange juice that was layered over a salad of spinach, strawberries, blueberries, pecans, and feta cheese.  Within 15 minutes she developed the worst abdominal pain she has ever had and developed vomiting and her throat hurt and she found it hard to talk and her lips were numb.  She took a Benadryl and Ativan and she was better within 1 hour.  She had no other associated systemic or constitutional symptoms.  Third, she does have a history with allergic rhinoconjunctivitis manifested as nasal congestion and sneezing usually presenting during the spring and sometimes during the fall for which he uses multiple medications with a pretty good result.  Provoking factors for her symptoms definitely include exposure to pollen.  Fourth, she has an issue where she feels as though she sometimes cannot catch her breath when she exercises.  Sometimes she can get a satisfactory breath even at rest.  She was empirically started on Singulair over the course of the past month and she thinks that this may have helped her exercise component to some degree.  Fifth, she has had a dermatitis on her elbow that was developed over the course of the past several years and she has seen Dr. Delman Cheadle and had a biopsy of this area about 2 months ago which apparently did not identify any specific diagnosis and she has been treated with the triamcinolone cream which she is not really sure has helped her very much.  Sixth, she has developed a large local reaction to a bee sting without  associated systemic or constitutional symptoms.  Past Medical History:  Diagnosis Date  . Allergy   . BRCA negative 12/2014  . Depression   . Esophagitis   . GERD (gastroesophageal reflux disease)   . Hiatal hernia   . History of diverticulitis   . IC (interstitial cystitis)   . Osteoporosis 10/2016   T score -2.6  . Schatzki's ring     Past Surgical History:  Procedure Laterality Date  . CHOLECYSTECTOMY    . HYSTEROSCOPY  12/2003   RESECTON OF SUBMUCOUS  MYOMA/DX SCOPE  . LAPAROSCOPIC BILATERAL SALPINGO OOPHERECTOMY Bilateral 07/02/2014   Procedure: LAPAROSCOPIC BILATERAL SALPINGO OOPHORECTOMY;  Surgeon: Anastasio Auerbach, MD;  Location: [facility];  Service: Gynecology;  Laterality: Bilateral;  . PELVIC LAPAROSCOPY  12/2003   DX SCOPE/RESECTION OF SUBMUCOUS MYOMA  . TMJ ARTHROSCOPY  11-26-2001  . VAGINAL HYSTERECTOMY  12/05   Menorrhagia/dysmenorrhea. Pathology showed leiomyoma/adenomyosis    Allergies as of 04/18/2018      Reactions   Honey Bee Treatment [bee Venom] Anaphylaxis   Eggs Or Egg-derived Products    Stomach ache   Wheat Bran    Intolerance       Medication List      APLENZIN 174 MG Tb24 Generic drug:  BuPROPion HBr Take 1 tablet by mouth daily.   CALCIUM 600 PO Take 1 tablet by mouth daily.   DIGESTIVE ENZYMES PO Take by mouth. Primary Digest Gastro-Digestive Enzyme take 1 before each meal   EPINEPHrine 0.3 mg/0.3 mL Soaj injection Commonly known as:  EPI-PEN INJECT 1 PEN INTO MUSCLE FOR ONE DOSE IF NEEDED   hyoscyamine 0.125 MG SL tablet Commonly known as:  LEVSIN SL Place 0.125 mg under the tongue every 4 (four) hours as needed.   LORazepam 0.5 MG tablet Commonly known as:  ATIVAN Take 0.5 mg by mouth daily as needed for sleep.   MAGNESIUM GLYCINATE PLUS PO Take 2 capsules by mouth at bedtime.   mometasone 50 MCG/ACT nasal spray Commonly known as:  NASONEX Place 2 sprays into the nose daily.   montelukast 10 MG tablet Commonly known  as:  SINGULAIR Take 1 tablet (10 mg total) by mouth at bedtime.   multivitamin tablet Take 1 tablet by mouth daily.   olopatadine 0.1 % ophthalmic solution Commonly known as:  PATANOL Place 1 drop into both eyes 2 (two) times daily.   OMEGAPURE 780 EC PO Take 1 tablet by mouth daily.   risedronate 150 MG tablet Commonly known as:  ACTONEL TAKE ONE TABLET(150MG) BY MOUTH EVERY 30 DAYS WITH WATER ON EMPTY STOMAACH NOTHING BY MOUTH AND DON'T LIE DOWN FOR 30 MINUTES   triamcinolone cream 0.1 % Commonly known as:  KENALOG APP EXT AA BID FOR 14 DAYS       Review of systems negative except as noted in HPI / PMHx or noted below:  Review of Systems  Constitutional: Negative.   HENT: Negative.   Eyes: Negative.   Respiratory: Negative.   Cardiovascular: Negative.   Gastrointestinal: Negative.  Genitourinary: Negative.   Musculoskeletal: Negative.   Skin: Negative.   Neurological: Negative.   Endo/Heme/Allergies: Negative.   Psychiatric/Behavioral: Negative.     Family History  Problem Relation Age of Onset  . Uterine cancer Maternal Grandmother        dx in her 8s  . Breast cancer Sister        paternal half sister dx in her early 37s  . Cystic fibrosis Other        brother's granddauther  . Emphysema Father   . Early death Father   . Breast cancer Sister        paternal half sister dx in her late 26s  . Cystic fibrosis Other        brothers granddaughters  . Depression Mother   . Hyperlipidemia Mother   . Hypertension Mother   . Mental illness Mother   . Miscarriages / [place] Mother   . Diabetes Brother 50       HALF BROTHER/HALF BROTHER   . Breast cancer Sister   . GI problems Brother   . Colon cancer Neg Hx   . Esophageal cancer Neg Hx   . Rectal cancer Neg Hx   . Stomach cancer Neg Hx   . Allergic rhinitis Neg Hx   . Asthma Neg Hx   . Eczema Neg Hx   . Urticaria Neg Hx     Social History   Socioeconomic History  . Marital status: Married      Spouse name: Not on file  . Number of children: 2  . Years of education: Not on file  . Highest education level: Not on file  Occupational History  . Not on file  Social Needs  . Financial resource strain: Not on file  . Food insecurity:    Worry: Not on file    Inability: Not on file  . Transportation needs:    Medical: Not on file    Non-medical: Not on file  Tobacco Use  . Smoking status: Never Smoker  . Smokeless tobacco: Never Used  Substance and Sexual Activity  . Alcohol use: Yes    Alcohol/week: 2.0 standard drinks    Types: 2 Glasses of wine per week    Comment: week  . Drug use: No  . Sexual activity: Yes    Birth control/protection: Surgical    Comment: HYST-1st intercourse 57 yo-Fewer than 5 partners  Lifestyle  . Physical activity:    Days per week: Not on file    Minutes per session: Not on file  . Stress: Not on file  Relationships  . Social connections:    Talks on phone: Not on file    Gets together: Not on file    Attends religious service: Not on file    Active member of club or organization: Not on file    Attends meetings of clubs or organizations: Not on file    Relationship status: Not on file  . Intimate partner violence:    Fear of current or ex partner: Not on file    Emotionally abused: Not on file    Physically abused: Not on file    Forced sexual activity: Not on file  Other Topics Concern  . Not on file  Social History Narrative  . Not on file    Environmental and Social history  Lives in a house with a dry environment, no animals located inside the household, carpet in the bedroom, no plastic on the bed, no plastic  on the pillow, and no smokers located inside the household.  Objective:   Vitals:   04/18/18 0858  BP: 106/60  Pulse: 72  Resp: 16  Temp: 98.4 F (36.9 C)   Height: _0  (154.9 cm) Weight: 146 lb 9.6 oz (66.5 kg)  Physical Exam  HENT:  Head: Normocephalic. Head is without right periorbital erythema and  without left periorbital erythema.  Right Ear: Tympanic membrane, external ear and ear canal normal.  Left Ear: Tympanic membrane, external ear and ear canal normal.  Nose: Nose normal. No mucosal edema or rhinorrhea.  Mouth/Throat: Uvula is midline, oropharynx is clear and moist and mucous membranes are normal. No oropharyngeal exudate.  Eyes: Pupils are equal, round, and reactive to light. Conjunctivae and lids are normal.  Neck: Trachea normal. No tracheal tenderness present. No tracheal deviation present. No thyromegaly present.  Cardiovascular: Normal rate, regular rhythm, S1 normal, S2 normal and normal heart sounds.  No murmur heard. Pulmonary/Chest: Effort normal and breath sounds normal. No stridor. No respiratory distress. She has no wheezes. She has no rales. She exhibits no tenderness.  Abdominal: Soft. She exhibits no distension and no mass. There is no hepatosplenomegaly. There is no tenderness. There is no rebound and no guarding.  Musculoskeletal: She exhibits no edema or tenderness.  Lymphadenopathy:       Head (right side): No tonsillar adenopathy present.       Head (left side): No tonsillar adenopathy present.    She has no cervical adenopathy.    She has no axillary adenopathy.  Neurological: She is alert.  Skin: Rash (Erythematous indurated plaque-like elbows bilaterally) noted. She is not diaphoretic. No erythema. No pallor. Nails show no clubbing.    Diagnostics: Allergy skin tests were performed.  She demonstrated severe hypersensitivity against grasses, trees, weeds, mold, and hypersensitivity against soybean, and slight hypersensitivity against peanut and sesame.  Spirometry was performed and demonstrated an FEV1 of 2.30 @ 95 % of predicted. FEV1/FVC = 0.83.  Following administration of nebulized albuterol her FEV1 did not improve significantly.  Results of blood tests obtained 20 March 2018 identified creatinine 0.76 mg/DL, AST 30 I U/L, ALT 28 IU/L, WBC 6.6,  absolute eosinophil 600, lymphocyte 1700, hemoglobin 12.9, platelet 215, TSH 1.31 IU/mL  Assessment and Plan:    1. Seasonal allergic rhinitis due to pollen   2. Asthma, mild intermittent, well-controlled   3. Anaphylactic shock due to food, initial encounter   4. Inflammatory dermatosis   5. Pollen-food allergy, initial encounter   6. Eosinophilia   7. Abdominal bloating   8. Generalized abdominal pain     1.  Allergen avoidance measures  2.  Attempt to discontinue supplements and determine which supplement is helping  3.  Attempt to eliminate all forms of caffeine and chocolate consumption  4.  Blood - C4, tryptase, alpha gal panel, nut panel with reflex, celiac screen with IgA  5.  EpiPen Cammie Sickle, Benadryl, MD/ER for severe allergic reaction  6.  Try a preventative plan for reactions:    A.  Ranitidine 300 mg - 1 tablet 1 time per day  B.  Loratadine 10 mg - 2 tablets 1 time per day  7.  If needed:   A.  Pro-air HFA or similar 2 inhalations every 4-6 hours.  B.  Patanol 1 drop each eye twice a day  C.  Triamcinolone cream  8.  Return to clinic in 6 weeks or earlier if problem  9.  Obtain full flu vaccine  Lanina has a very atopic immune system demonstrating significant hypersensitivity against various allergens and the presence of eosinophilia.  Most of her issue may be tied up with atopic disease but to be complete we are going to look at some other issues that can present with her abdominal complaints with the blood tests noted above.  I am going to give her an H1 and H2 receptor blocker on a consistent basis to hopefully minimize any hypersensitivity reactions that may develop with consumption of foods containing pollen cross-reactive proteins and also to address what may be a component of reflux.  I have asked her to slowly taper off all forms of caffeine as this may also be causing some degree of gut hypersensitivity and I have asked her to determine if she requires the  supplements provided by integrative medicine 3 years ago by eliminating these agents.  I will see her back in this clinic in 6 weeks or earlier if there is a problem.  Jiles Prows, MD Allergy / Immunology [facility]

## 2018-04-18 NOTE — Patient Instructions (Addendum)
  1.  Allergen avoidance measures  2.  Attempt to discontinue supplements and determine which supplement is helping  3.  Attempt to eliminate all forms of caffeine and chocolate consumption  4.  Blood - C4, tryptase, alpha gal panel, nut panel with reflex, celiac screen with IgA  5.  EpiPen Cammie Sickle, Benadryl, MD/ER for severe allergic reaction  6.  Try a preventative plan for reactions:    A.  Ranitidine 300 mg - 1 tablet 1 time per day  B.  Loratadine 10 mg - 2 tablets 1 time per day  7.  If needed:   A.  Pro-air HFA or similar 2 inhalations every 4-6 hours.  B.  Patanol 1 drop each eye twice a day  C.  Triamcinolone cream  8.  Return to clinic in 6 weeks or earlier if problem  9.  Obtain full flu vaccine

## 2018-04-19 ENCOUNTER — Telehealth: Payer: Self-pay | Admitting: Allergy and Immunology

## 2018-04-19 ENCOUNTER — Encounter: Payer: Self-pay | Admitting: Allergy and Immunology

## 2018-04-19 NOTE — Telephone Encounter (Signed)
Loratadine was medication in question. Patient wanted to make sure it was okay to take 2 tab po qd despite box at pharmacy saying 1 tab po qd. I advised her that per discharge instructions it was okay. Patient was in agreement with this information and will do as instructed.

## 2018-04-19 NOTE — Telephone Encounter (Signed)
Pt called and said on paper work said ranitidine 2 once a day but rx said 1 time a day . [phone].

## 2018-04-24 ENCOUNTER — Ambulatory Visit (INDEPENDENT_AMBULATORY_CARE_PROVIDER_SITE_OTHER): Payer: Managed Care, Other (non HMO) | Admitting: Family Medicine

## 2018-04-24 ENCOUNTER — Encounter: Payer: Self-pay | Admitting: Family Medicine

## 2018-04-24 VITALS — BP 114/68 | HR 85 | Temp 98.8°F | Ht 61.5 in | Wt 143.8 lb

## 2018-04-24 DIAGNOSIS — Z0000 Encounter for general adult medical examination without abnormal findings: Secondary | ICD-10-CM

## 2018-04-24 DIAGNOSIS — R0602 Shortness of breath: Secondary | ICD-10-CM | POA: Diagnosis not present

## 2018-04-24 DIAGNOSIS — M810 Age-related osteoporosis without current pathological fracture: Secondary | ICD-10-CM | POA: Diagnosis not present

## 2018-04-24 DIAGNOSIS — F339 Major depressive disorder, recurrent, unspecified: Secondary | ICD-10-CM

## 2018-04-24 DIAGNOSIS — Z91018 Allergy to other foods: Secondary | ICD-10-CM

## 2018-04-24 LAB — HEMOGLOBIN A1C: Hgb A1c MFr Bld: 5.6 % (ref 4.6–6.5)

## 2018-04-24 NOTE — Assessment & Plan Note (Signed)
Well controlled. No changes made to rxs. 

## 2018-04-24 NOTE — Patient Instructions (Addendum)
Great to see you. I will call you with your lab results from today and you can view them online.   Restart singulair like we discussed and let me know what other changes you make after your trip.  Have a wonderful time in [place]!!  Happy birthday!

## 2018-04-24 NOTE — Assessment & Plan Note (Signed)
Reviewed preventive care protocols, scheduled due services, and updated immunizations Discussed nutrition, exercise, diet, and healthy lifestyle.  

## 2018-04-24 NOTE — Addendum Note (Signed)
Addended by: Marrion Coy on: 04/24/2018 12:17 PM   Modules accepted: Orders

## 2018-04-24 NOTE — Assessment & Plan Note (Signed)
>>  ASSESSMENT AND PLAN FOR DEPRESSION, MAJOR, SINGLE EPISODE, COMPLETE REMISSION ([facility]CC) WRITTEN ON 04/24/2018 12:00 PM BY Dianne Dun, MD  Well controlled. No changes made to rxs.

## 2018-04-24 NOTE — Assessment & Plan Note (Addendum)
Continue adequate intake of calcium and vitamin D, weight bearing exercise and Actonel. DEXA UTD. The patient indicates understanding of these issues and agrees with the plan.

## 2018-04-24 NOTE — Progress Notes (Signed)
Subjective:   Patient ID: Yolanda [name], female    DOB: 1961-01-19, 57 y.o.   MRN: [id number]  [name] is a pleasant 57 y.o. year old female who presents to clinic today with Annual Exam (Patient is here today for a CPE.  Her PAPs(last 12.29.18) and Mammograms(last 3.26.18) are done by Dr. Phineas Real.  Next Colonoscopy due on 7.30.24 @ [facility].  Her immunizations are UTD.  Was seen by Allergist on 8.28.19.  Her labs were completed on 7.30.19 and she is aware of results.  Needs a Continine test and an A1C test for health screening from work. )  on 04/24/2018  HPI:  Established care with me on 03/20/18. Note reviewed.  Has GYN- Dr. Phineas Real. Last saw him for screening on 08/17/17.  Note reviewed- s/p hysterectomy. Colonoscopy UTD. Osteoporosis- managed by Dr. Phineas Real.  She is on Actonel and not yet due for repeat DEXA. Sees dermatology, Dr. Delman Cheadle, yearly for skin cancer check.  Health Maintenance  Topic Date Due  . INFLUENZA VACCINE  05/01/2018 (Originally 03/22/2018)  . MAMMOGRAM  11/08/2018  . PAP SMEAR  08/17/2020  . COLONOSCOPY  03/21/2023  . TETANUS/TDAP  03/20/2028  . Hepatitis C Screening  Completed  . HIV Screening  Completed     At 02/2018 OV with me, she complained of new, persistent SOB that was worsened by her allergies.  We started her on Singulair at that time. Also checked CBC and TSH with additional labs which were unremarkable.  She did feel singulair worked well but Dr. Neldon Mc stopped it (see below).  She also told me about a severe allergic reaction she had to honey at her initial visit.  Symptoms consistent with anaphylaxis.  I therefore referred her to an allergist and sent in an eRx for epipen to have her with at all times.  Depression/anxiety- well controlled on Aplenzin.  Denies any symptoms of anxiety or depression  Saw Dr. Neldon Mc, allergist, on 04/18/18.  Note reviewed. His plan was as follows:  1.  Allergen avoidance measures  2.   Attempt to discontinue supplements and determine which supplement is helping  3.  Attempt to eliminate all forms of caffeine and chocolate consumption  4.  Blood - C4, tryptase, alpha gal panel, nut panel with reflex, celiac screen with IgA  5.  EpiPen Cammie Sickle, Benadryl, MD/ER for severe allergic reaction  6.  Try a preventative plan for reactions:                 A.  Ranitidine 300 mg - 1 tablet 1 time per day             B.  Loratadine 10 mg - 2 tablets 1 time per day  7.  If needed:              A.  Pro-air HFA or similar 2 inhalations every 4-6 hours.             B.  Patanol 1 drop each eye twice a day             C.  Triamcinolone cream  8.  Return to clinic in 6 weeks or earlier if problem  She had a hard time tolerating all these changes and she is not sure what caused it because she added so many medications at once and stopped her supplements but had severe vertigo and diarrhea.  She was fearful to try the albuterol inhaler at home as the nebs  in the allergist's office made her heart race.  Has since stopped taking ranitidine and loratadine and plans to restart this after her trip planned to [place] and after she follows up with Dr. Neldon Mc.  Lab Results  Component Value Date   CHOL 126 03/20/2018   HDL 57.60 03/20/2018   LDLCALC 55 03/20/2018   TRIG 69.0 03/20/2018   CHOLHDL 2 03/20/2018   Lab Results  Component Value Date   CREATININE 0.76 03/20/2018   Lab Results  Component Value Date   NA 141 03/20/2018   K 4.0 03/20/2018   CL 102 03/20/2018   CO2 31 03/20/2018   Lab Results  Component Value Date   WBC 6.6 03/20/2018   HGB 12.9 03/20/2018   HCT 38.2 03/20/2018   MCV 84.2 03/20/2018   PLT 215.0 03/20/2018     Current Outpatient Medications on File Prior to Visit  Medication Sig Dispense Refill  . albuterol (PROVENTIL HFA;VENTOLIN HFA) 108 (90 Base) MCG/ACT inhaler Inhale 2 puffs into the lungs every 6 (six) hours as needed for wheezing or  shortness of breath. 1 Inhaler 2  . APLENZIN 174 MG TB24 Take 1 tablet by mouth daily.  0  . Calcium Carbonate (CALCIUM 600 PO) Take 1 tablet by mouth daily.    Marland Kitchen DIGESTIVE ENZYMES PO Take by mouth. Primary Digest Gastro-Digestive Enzyme take 1 before each meal    . EPINEPHrine 0.3 mg/0.3 mL IJ SOAJ injection INJECT 1 PEN INTO MUSCLE FOR ONE DOSE IF NEEDED  2  . hyoscyamine (LEVSIN SL) 0.125 MG SL tablet Place 0.125 mg under the tongue every 4 (four) hours as needed.    Marland Kitchen LORazepam (ATIVAN) 0.5 MG tablet Take 0.5 mg by mouth daily as needed for sleep.     Marland Kitchen MAGNESIUM GLYCINATE PLUS PO Take 2 capsules by mouth at bedtime.    . mometasone (NASONEX) 50 MCG/ACT nasal spray Place 2 sprays into the nose daily. 17 g 5  . montelukast (SINGULAIR) 10 MG tablet Take 1 tablet (10 mg total) by mouth at bedtime. 30 tablet 3  . Multiple Vitamin (MULTIVITAMIN) tablet Take 1 tablet by mouth daily.      Marland Kitchen olopatadine (PATANOL) 0.1 % ophthalmic solution Place 1 drop into both eyes 2 (two) times daily. 5 mL 12  . Omega-3 Fatty Acids (OMEGAPURE 780 EC PO) Take 1 tablet by mouth daily.    . ranitidine (ZANTAC) 300 MG tablet Take 1 tablet (300 mg total) by mouth at bedtime. 30 tablet 1  . risedronate (ACTONEL) 150 MG tablet TAKE ONE TABLET(150MG) BY MOUTH EVERY 30 DAYS WITH WATER ON EMPTY STOMAACH NOTHING BY MOUTH AND DON'T LIE DOWN FOR 30 MINUTES 1 tablet 6  . triamcinolone cream (KENALOG) 0.1 % APP EXT AA BID FOR 14 DAYS  1   No current facility-administered medications on file prior to visit.     Allergies  Allergen Reactions  . Honey Bee Treatment [Bee Venom] Anaphylaxis  . Eggs Or Egg-Derived Products     Stomach ache  . Wheat Bran     Intolerance     Past Medical History:  Diagnosis Date  . Allergy   . BRCA negative 12/2014  . Depression   . Esophagitis   . GERD (gastroesophageal reflux disease)   . Hiatal hernia   . History of diverticulitis   . IC (interstitial cystitis)   . Osteoporosis  10/2016   T score -2.6  . Schatzki's ring     Past Surgical History:  Procedure Laterality Date  . CHOLECYSTECTOMY    . HYSTEROSCOPY  12/2003   RESECTON OF SUBMUCOUS  MYOMA/DX SCOPE  . LAPAROSCOPIC BILATERAL SALPINGO OOPHERECTOMY Bilateral 07/02/2014   Procedure: LAPAROSCOPIC BILATERAL SALPINGO OOPHORECTOMY;  Surgeon: Anastasio Auerbach, MD;  Location: [facility];  Service: Gynecology;  Laterality: Bilateral;  . PELVIC LAPAROSCOPY  12/2003   DX SCOPE/RESECTION OF SUBMUCOUS MYOMA  . TMJ ARTHROSCOPY  11-26-2001  . VAGINAL HYSTERECTOMY  12/05   Menorrhagia/dysmenorrhea. Pathology showed leiomyoma/adenomyosis    Family History  Problem Relation Age of Onset  . Uterine cancer Maternal Grandmother        dx in her 60s  . Breast cancer Sister        paternal half sister dx in her early 32s  . Cystic fibrosis Other        brother's granddauther  . Emphysema Father   . Early death Father   . Breast cancer Sister        paternal half sister dx in her late 44s  . Cystic fibrosis Other        brothers granddaughters  . Depression Mother   . Hyperlipidemia Mother   . Hypertension Mother   . Mental illness Mother   . Miscarriages / [place] Mother   . Diabetes Brother 50       HALF BROTHER/HALF BROTHER   . Breast cancer Sister   . GI problems Brother   . Colon cancer Neg Hx   . Esophageal cancer Neg Hx   . Rectal cancer Neg Hx   . Stomach cancer Neg Hx   . Allergic rhinitis Neg Hx   . Asthma Neg Hx   . Eczema Neg Hx   . Urticaria Neg Hx     Social History   Socioeconomic History  . Marital status: Married    Spouse name: Not on file  . Number of children: 2  . Years of education: Not on file  . Highest education level: Not on file  Occupational History  . Not on file  Social Needs  . Financial resource strain: Not on file  . Food insecurity:    Worry: Not on file    Inability: Not on file  . Transportation needs:    Medical: Not on file    Non-medical: Not on  file  Tobacco Use  . Smoking status: Never Smoker  . Smokeless tobacco: Never Used  Substance and Sexual Activity  . Alcohol use: Yes    Alcohol/week: 2.0 standard drinks    Types: 2 Glasses of wine per week    Comment: week  . Drug use: No  . Sexual activity: Yes    Birth control/protection: Surgical    Comment: HYST-1st intercourse 57 yo-Fewer than 5 partners  Lifestyle  . Physical activity:    Days per week: Not on file    Minutes per session: Not on file  . Stress: Not on file  Relationships  . Social connections:    Talks on phone: Not on file    Gets together: Not on file    Attends religious service: Not on file    Active member of club or organization: Not on file    Attends meetings of clubs or organizations: Not on file    Relationship status: Not on file  . Intimate partner violence:    Fear of current or ex partner: Not on file    Emotionally abused: Not on file    Physically abused: Not on file  Forced sexual activity: Not on file  Other Topics Concern  . Not on file  Social History Narrative  . Not on file   The PMH, PSH, Social History, Family History, Medications, and allergies have been reviewed in [facility], and have been updated if relevant.   Review of Systems  Constitutional: Negative.   HENT: Negative.   Eyes: Negative.   Respiratory: Negative.   Cardiovascular: Negative.   Gastrointestinal: Negative.   Endocrine: Negative.   Genitourinary: Negative.   Musculoskeletal: Negative.   Skin: Positive for rash.  Allergic/Immunologic: Negative.   Neurological: Negative.   Hematological: Negative.   Psychiatric/Behavioral: Negative.   All other systems reviewed and are negative.      Objective:    BP 114/68 (BP Location: Left Arm, Patient Position: Sitting, Cuff Size: Normal)   Pulse 85   Temp 98.8 F (37.1 C) (Oral)   Ht 5' 1.5" (1.562 m)   Wt 143 lb 12.8 oz (65.2 kg)   SpO2 98%   BMI 26.73 kg/m    Physical Exam  Constitutional: She  is oriented to person, place, and time. She appears well-developed and well-nourished. No distress.  HENT:  Head: Normocephalic and atraumatic.  Eyes: EOM are normal.  Neck: Normal range of motion. Neck supple. No thyromegaly present.  Cardiovascular: Normal rate and regular rhythm.  Pulmonary/Chest: Effort normal and breath sounds normal.  Abdominal: Soft. Bowel sounds are normal. She exhibits no distension.  Musculoskeletal: Normal range of motion.  Neurological: She is alert and oriented to person, place, and time. No cranial nerve deficit.  Skin: Skin is warm and dry. She is not diaphoretic.  Psychiatric: She has a normal mood and affect. Her behavior is normal. Judgment and thought content normal.  Nursing note and vitals reviewed.         Assessment & Plan:   Well woman exam without gynecological exam - Plan: Hemoglobin A1c, Nicotine/cotinine metabolites  SOB (shortness of breath) on exertion  Osteoporosis, unspecified osteoporosis type, unspecified pathological fracture presence  Depression, recurrent ([place])  Food allergy No follow-ups on file.

## 2018-04-24 NOTE — Assessment & Plan Note (Signed)
She plans on restarting singulair as this was helping her and then she will consider re adding ranititidine and loratadine if needed and tolerated.

## 2018-04-24 NOTE — Assessment & Plan Note (Signed)
Followed by Dr. Neldon Mc.

## 2018-04-25 LAB — ALPHA-GAL PANEL
Beef (Bos spp) IgE: <0.1 kU/L (ref <0.35)
Class Interpretation: 0
LAMB CLASS INTERPRETATION: 0
PORK CLASS INTERPRETATION: 0

## 2018-04-25 LAB — IGE NUT PROF. W/COMPONENT RFLX
Brazil Nut IgE: <0.1 kU/L
F020-IgE Almond: <0.1 kU/L
F202-IgE Cashew Nut: <0.1 kU/L
F203-IgE Pistachio Nut: <0.1 kU/L
Hazelnut (Filbert) IgE: <0.1 kU/L
Walnut IgE: <0.1 kU/L

## 2018-04-25 LAB — CELIAC PANEL 10
ANTIGLIADIN ABS, IGA: 4 U (ref 0–19)
Endomysial IgA: NEGATIVE
GLIADIN IGG: 2 U (ref 0–19)
IgA/Immunoglobulin A, Serum: 232 mg/dL (ref 87–352)
Tissue Transglut Ab: <2 U/mL (ref 0–5)
Transglutaminase IgA: <2 U/mL (ref 0–3)

## 2018-04-25 LAB — C4 COMPLEMENT: COMPLEMENT C4, SERUM: 35 mg/dL (ref 14–44)

## 2018-04-25 LAB — TRYPTASE: Tryptase: 9.5 ug/L (ref 2.2–13.2)

## 2018-04-28 LAB — NICOTINE/COTININE METABOLITES
COTININE: NOT DETECTED ng/mL
NICOTINE: NOT DETECTED ng/mL

## 2018-06-05 ENCOUNTER — Ambulatory Visit: Payer: Managed Care, Other (non HMO) | Admitting: Allergy and Immunology

## 2018-07-10 ENCOUNTER — Ambulatory Visit: Payer: Managed Care, Other (non HMO) | Admitting: Allergy and Immunology

## 2018-07-17 ENCOUNTER — Encounter: Payer: Self-pay | Admitting: Allergy and Immunology

## 2018-07-17 ENCOUNTER — Ambulatory Visit (INDEPENDENT_AMBULATORY_CARE_PROVIDER_SITE_OTHER): Payer: Managed Care, Other (non HMO) | Admitting: Allergy and Immunology

## 2018-07-17 VITALS — BP 140/78 | HR 80 | Resp 20

## 2018-07-17 DIAGNOSIS — T7800XD Anaphylactic reaction due to unspecified food, subsequent encounter: Secondary | ICD-10-CM | POA: Diagnosis not present

## 2018-07-17 DIAGNOSIS — T781XXD Other adverse food reactions, not elsewhere classified, subsequent encounter: Secondary | ICD-10-CM

## 2018-07-17 DIAGNOSIS — J301 Allergic rhinitis due to pollen: Secondary | ICD-10-CM

## 2018-07-17 DIAGNOSIS — J4520 Mild intermittent asthma, uncomplicated: Secondary | ICD-10-CM

## 2018-07-17 DIAGNOSIS — L308 Other specified dermatitis: Secondary | ICD-10-CM

## 2018-07-17 DIAGNOSIS — L989 Disorder of the skin and subcutaneous tissue, unspecified: Secondary | ICD-10-CM

## 2018-07-17 NOTE — Progress Notes (Signed)
Follow-up Note  Referring Provider: Lucille Passy, MD Primary Provider: Lucille Passy, MD Date of Office Visit: 07/17/2018  Subjective:   Yolanda [name] (DOB: 09-23-1960) is a 57 y.o. female who returns to the [facility] on 07/17/2018 in re-evaluation of the following:  HPI: Yolanda [name] presents to this clinic in evaluation of allergic rhinitis, intermittent asthma, oral allergy syndrome, food allergy, and an inflammatory dermatosis.  I last saw her in this clinic in 18 April 2018.  Apparently soon after visiting with me she developed very significant vertigo that has only recently resolved.  She still has some positional episodes.  She does have a long history of vertigo that is intermittent and usually not particularly severe but this was a very bad outbreak.  She did not have any associated tinnitus or hearing loss with this episode.  She is not really sure why this was triggered but she discontinued the medications prescribed during her last visit and basically went back to doing what she was doing in the past.  Thus, she continues to avoid a very large amount of food substances including gluten and raw vegetables and raw fruits and almonds and most egg product.  She eats fish and chicken and rarely mammal and rice and oats and cooked vegetables and blueberries and strawberries and pecan and cashew and walnut.  Occasionally she will use some Nasonex and some Allegra for her nasal allergies which appear to be going quite well at this time of the year.  Occasionally she will use some topical steroids for the inflammatory dermatosis involving her elbows.  She has not had any significant issues with asthma and has not used a short acting bronchodilator.  She has not required a systemic steroid to treat an exacerbation.  She did receive the flu vaccine this year.  Allergies as of 07/17/2018      Reactions   Honey Bee Treatment [bee Venom] Anaphylaxis   Eggs Or Egg-derived  Products    Stomach ache   Wheat Bran    Intolerance       Medication List      albuterol 108 (90 Base) MCG/ACT inhaler Commonly known as:  PROVENTIL HFA;VENTOLIN HFA Inhale 2 puffs into the lungs every 6 (six) hours as needed for wheezing or shortness of breath.   APLENZIN 174 MG Tb24 Generic drug:  BuPROPion HBr Take 1 tablet by mouth daily.   CALCIUM 600 PO Take 1 tablet by mouth daily.   DIGESTIVE ENZYMES PO Take by mouth. Primary Digest Gastro-Digestive Enzyme take 1 before each meal   EPINEPHrine 0.3 mg/0.3 mL Soaj injection Commonly known as:  EPI-PEN INJECT 1 PEN INTO MUSCLE FOR ONE DOSE IF NEEDED   hyoscyamine 0.125 MG SL tablet Commonly known as:  LEVSIN SL Place 0.125 mg under the tongue every 4 (four) hours as needed.   LORazepam 0.5 MG tablet Commonly known as:  ATIVAN Take 0.5 mg by mouth daily as needed for sleep.   MAGNESIUM GLYCINATE PLUS PO Take 2 capsules by mouth at bedtime.   mometasone 50 MCG/ACT nasal spray Commonly known as:  NASONEX Place 2 sprays into the nose daily.   montelukast 10 MG tablet Commonly known as:  SINGULAIR Take 1 tablet (10 mg total) by mouth at bedtime.   multivitamin tablet Take 1 tablet by mouth daily.   olopatadine 0.1 % ophthalmic solution Commonly known as:  PATANOL Place 1 drop into both eyes 2 (two) times daily.  OMEGAPURE 780 EC PO Take 1 tablet by mouth daily.   ranitidine 300 MG tablet Commonly known as:  ZANTAC Take 1 tablet (300 mg total) by mouth at bedtime.   risedronate 150 MG tablet Commonly known as:  ACTONEL TAKE ONE TABLET(150MG) BY MOUTH EVERY 30 DAYS WITH WATER ON EMPTY STOMAACH NOTHING BY MOUTH AND DON'T LIE DOWN FOR 30 MINUTES   triamcinolone cream 0.1 % Commonly known as:  KENALOG APP EXT AA BID FOR 14 DAYS       Past Medical History:  Diagnosis Date  . Allergy   . BRCA negative 12/2014  . Depression   . Esophagitis   . GERD (gastroesophageal reflux disease)   . Hiatal  hernia   . History of diverticulitis   . IC (interstitial cystitis)   . Osteoporosis 10/2016   T score -2.6  . Schatzki's ring     Past Surgical History:  Procedure Laterality Date  . CHOLECYSTECTOMY    . HYSTEROSCOPY  12/2003   RESECTON OF SUBMUCOUS  MYOMA/DX SCOPE  . LAPAROSCOPIC BILATERAL SALPINGO OOPHERECTOMY Bilateral 07/02/2014   Procedure: LAPAROSCOPIC BILATERAL SALPINGO OOPHORECTOMY;  Surgeon: Anastasio Auerbach, MD;  Location: [facility];  Service: Gynecology;  Laterality: Bilateral;  . PELVIC LAPAROSCOPY  12/2003   DX SCOPE/RESECTION OF SUBMUCOUS MYOMA  . TMJ ARTHROSCOPY  11-26-2001  . VAGINAL HYSTERECTOMY  12/05   Menorrhagia/dysmenorrhea. Pathology showed leiomyoma/adenomyosis    Review of systems negative except as noted in HPI / PMHx or noted below:  Review of Systems  Constitutional: Negative.   HENT: Negative.   Eyes: Negative.   Respiratory: Negative.   Cardiovascular: Negative.   Gastrointestinal: Negative.   Genitourinary: Negative.   Musculoskeletal: Negative.   Skin: Negative.   Neurological: Negative.   Endo/Heme/Allergies: Negative.   Psychiatric/Behavioral: Negative.      Objective:   Vitals:   07/17/18 1132  BP: 140/78  Pulse: 80  Resp: 20          Physical Exam  HENT:  Head: Normocephalic.  Right Ear: Tympanic membrane, external ear and ear canal normal.  Left Ear: Tympanic membrane, external ear and ear canal normal.  Nose: Nose normal. No mucosal edema or rhinorrhea.  Mouth/Throat: Uvula is midline, oropharynx is clear and moist and mucous membranes are normal. No oropharyngeal exudate.  Eyes: Conjunctivae are normal.  Neck: Trachea normal. No tracheal tenderness present. No tracheal deviation present. No thyromegaly present.  Cardiovascular: Normal rate, regular rhythm, S1 normal, S2 normal and normal heart sounds.  No murmur heard. Pulmonary/Chest: Breath sounds normal. No stridor. No respiratory distress. She has no wheezes. She  has no rales.  Musculoskeletal: She exhibits no edema.  Lymphadenopathy:       Head (right side): No tonsillar adenopathy present.       Head (left side): No tonsillar adenopathy present.    She has no cervical adenopathy.  Neurological: She is alert.  Skin: Rash (Erythematous indurated slightly scaly posterior elbow bilaterally) noted. She is not diaphoretic. No erythema. Nails show no clubbing.    Diagnostics:    Spirometry was performed and demonstrated an FEV1 of 2.22 at 92 % of predicted.  Results of blood tests obtained 18 April 2018 identified less than 2U/mL transglutaminase IgA, C4 35 mg/DL, negative alpha gal panel, no IgE antibodies directed against nut panel, tryptase 9.5 UG/L.  Assessment and Plan:   1. Asthma, mild intermittent, well-controlled   2. Seasonal allergic rhinitis due to pollen   3. Oral allergy syndrome, subsequent  encounter   4. Anaphylactic shock due to food, subsequent encounter   5. Inflammatory dermatosis     1.  Perform Allergen avoidance measures as best as possible  2.  Start a course of immunotherapy  3. Continue Nasonex and Allegra  4.  If needed:   A.  Pro-air HFA or similar 2 inhalations every 4-6 hours.  B.  Patanol 1 drop each eye twice a day  C.  Triamcinolone cream  D.   EpiPen Cammie Sickle, Benadryl, MD/ER for severe allergic reaction  5.  Return to clinic in 6 months or earlier if problem  Yolanda [name] will be starting a course of immunotherapy in the hope of treating her allergic rhinitis and her oral allergy syndrome and thus allowing her to expand her repertoire of food consumption in the future.  She will continue on a large collection of other medical therapy as noted above should it be required and I have encouraged her to restart her Nasonex and her Allegra Valentine's Day 2020 in preparation for this upcoming springtime season.  I will see her back in this clinic in 6 months or earlier if there is a problem.  Allena Katz, MD Allergy  / Immunology [place]

## 2018-07-17 NOTE — Patient Instructions (Addendum)
  1.  Perform Allergen avoidance measures as best as possible  2.  Start a course of immunotherapy  3. Continue Nasonex and Allegra  4.  If needed:   A.  Pro-air HFA or similar 2 inhalations every 4-6 hours.  B.  Patanol 1 drop each eye twice a day  C.  Triamcinolone cream  D.   EpiPen Cammie Sickle, Benadryl, MD/ER for severe allergic reaction  5.  Return to clinic in 6 months or earlier if problem

## 2018-07-18 ENCOUNTER — Encounter: Payer: Self-pay | Admitting: Allergy and Immunology

## 2018-07-24 ENCOUNTER — Other Ambulatory Visit: Payer: Self-pay | Admitting: Family Medicine

## 2018-08-01 ENCOUNTER — Other Ambulatory Visit: Payer: Self-pay | Admitting: Gynecology

## 2018-08-07 DIAGNOSIS — J301 Allergic rhinitis due to pollen: Secondary | ICD-10-CM

## 2018-08-08 DIAGNOSIS — J302 Other seasonal allergic rhinitis: Secondary | ICD-10-CM

## 2018-08-09 ENCOUNTER — Other Ambulatory Visit: Payer: Self-pay | Admitting: Allergy and Immunology

## 2018-08-09 DIAGNOSIS — J4520 Mild intermittent asthma, uncomplicated: Secondary | ICD-10-CM

## 2018-08-09 DIAGNOSIS — J301 Allergic rhinitis due to pollen: Secondary | ICD-10-CM

## 2018-08-09 NOTE — Progress Notes (Signed)
VIALS EXP 08-10-19

## 2018-08-23 ENCOUNTER — Other Ambulatory Visit: Payer: Self-pay | Admitting: Family Medicine

## 2018-08-27 ENCOUNTER — Ambulatory Visit (INDEPENDENT_AMBULATORY_CARE_PROVIDER_SITE_OTHER): Payer: Managed Care, Other (non HMO) | Admitting: *Deleted

## 2018-08-27 DIAGNOSIS — J309 Allergic rhinitis, unspecified: Secondary | ICD-10-CM

## 2018-08-27 NOTE — Progress Notes (Signed)
Immunotherapy   Patient Details  Name: Yolanda [name] MRN: [id number] Date of Birth: 09-21-1960  08/27/2018  Yolanda [name] started injections for  Yolanda [name] Following schedule: B  Frequency:1-2 TIMES WEEKLY  Epi-Pen: YES Consent signed and patient instructions given.  Patient was given .05 GRASS-Tree in the [place]UA, and WEEDS in the [place]. Patient waited full time and did not experience any issues.    Yolanda [name] R 08/27/2018, 3:34 PM

## 2018-08-31 ENCOUNTER — Ambulatory Visit (INDEPENDENT_AMBULATORY_CARE_PROVIDER_SITE_OTHER): Payer: Managed Care, Other (non HMO) | Admitting: *Deleted

## 2018-08-31 DIAGNOSIS — J309 Allergic rhinitis, unspecified: Secondary | ICD-10-CM | POA: Diagnosis not present

## 2018-09-04 ENCOUNTER — Ambulatory Visit (INDEPENDENT_AMBULATORY_CARE_PROVIDER_SITE_OTHER): Payer: Managed Care, Other (non HMO)

## 2018-09-04 ENCOUNTER — Encounter: Payer: Self-pay | Admitting: Gynecology

## 2018-09-04 ENCOUNTER — Ambulatory Visit (INDEPENDENT_AMBULATORY_CARE_PROVIDER_SITE_OTHER): Payer: Managed Care, Other (non HMO) | Admitting: Gynecology

## 2018-09-04 VITALS — BP 118/74 | Ht 61.0 in | Wt 142.0 lb

## 2018-09-04 DIAGNOSIS — Z01419 Encounter for gynecological examination (general) (routine) without abnormal findings: Secondary | ICD-10-CM

## 2018-09-04 DIAGNOSIS — J309 Allergic rhinitis, unspecified: Secondary | ICD-10-CM | POA: Diagnosis not present

## 2018-09-04 DIAGNOSIS — M810 Age-related osteoporosis without current pathological fracture: Secondary | ICD-10-CM | POA: Diagnosis not present

## 2018-09-04 DIAGNOSIS — N952 Postmenopausal atrophic vaginitis: Secondary | ICD-10-CM

## 2018-09-04 MED ORDER — RISEDRONATE SODIUM 150 MG PO TABS: ORAL_TABLET | ORAL | 4 refills | Status: DC

## 2018-09-04 NOTE — Progress Notes (Signed)
    Yolanda [name] 1960-11-08 [id number]        58 y.o.  [id number] for annual gynecologic exam.  Without gynecologic complaints  Past medical history,surgical history, problem list, medications, allergies, family history and social history were all reviewed and documented as reviewed in the EPIC chart.  ROS:  Performed with pertinent positives and negatives included in the history, assessment and plan.   Additional significant findings : None   Exam: Caryn Bee assistant Vitals:   09/04/18 1146  BP: 118/74  Weight: 142 lb (64.4 kg)  Height: 5\' 1"  (1.549 m)   Body mass index is 26.83 kg/m.  General appearance:  Normal affect, orientation and appearance. Skin: Grossly normal HEENT: Without gross lesions.  No cervical or supraclavicular adenopathy. Thyroid normal.  Lungs:  Clear without wheezing, rales or rhonchi Cardiac: RR, without RMG Abdominal:  Soft, nontender, without masses, guarding, rebound, organomegaly or hernia Breasts:  Examined lying and sitting without masses, retractions, discharge or axillary adenopathy. Pelvic:  Ext, BUS, Vagina: With atrophic changes  Adnexa: Without masses or tenderness    Anus and perineum: Normal   Rectovaginal: Normal sphincter tone without palpated masses or tenderness.    Assessment/Plan:  58 y.o. [id number] female for annual gynecologic exam.  Status post TVH 2005.  Subsequent laparoscopic BSO 2015 for pain.  1. Postmenopausal.  Doing well without menopausal symptoms. 2. Osteoporosis.  DEXA 2018 T score -2.6.  On Actonel since 12/2016 doing well.  Refill x1 year provided.  Repeat DEXA this year at 2-year interval and she will schedule in follow-up for this. 3. Mammography 10/2017.  Continue with annual mammography when due.  Breast exam normal today.  Strong family history of breast cancer.  Patient was genetically tested and negative.  Calculated lifetime risk 15%. 4. Colonoscopy 2014.  Repeat at their recommended interval. 5. Pap smear  2018.  No Pap smear done today.  No history of significant abnormal Pap smears.  Options to stop screening per current screening guidelines based on hysterectomy reviewed.  Will readdress on an annual basis. 6. Health maintenance.  No routine lab work done as patient does this elsewhere.  Follow-up 1 year, sooner as needed.   [name], 12:16 PM 09/04/2018

## 2018-09-04 NOTE — Patient Instructions (Signed)
Followup for bone density as scheduled. 

## 2018-09-07 ENCOUNTER — Ambulatory Visit (INDEPENDENT_AMBULATORY_CARE_PROVIDER_SITE_OTHER): Payer: Managed Care, Other (non HMO) | Admitting: *Deleted

## 2018-09-07 DIAGNOSIS — J309 Allergic rhinitis, unspecified: Secondary | ICD-10-CM | POA: Diagnosis not present

## 2018-09-11 ENCOUNTER — Ambulatory Visit (INDEPENDENT_AMBULATORY_CARE_PROVIDER_SITE_OTHER): Payer: Managed Care, Other (non HMO) | Admitting: *Deleted

## 2018-09-11 DIAGNOSIS — J309 Allergic rhinitis, unspecified: Secondary | ICD-10-CM

## 2018-09-14 ENCOUNTER — Encounter (HOSPITAL_BASED_OUTPATIENT_CLINIC_OR_DEPARTMENT_OTHER): Payer: Self-pay

## 2018-09-14 ENCOUNTER — Observation Stay (HOSPITAL_COMMUNITY): Payer: Managed Care, Other (non HMO) | Admitting: Anesthesiology

## 2018-09-14 ENCOUNTER — Telehealth: Payer: Self-pay | Admitting: Family Medicine

## 2018-09-14 ENCOUNTER — Encounter (HOSPITAL_COMMUNITY): Payer: Self-pay | Admitting: Emergency Medicine

## 2018-09-14 ENCOUNTER — Ambulatory Visit (INDEPENDENT_AMBULATORY_CARE_PROVIDER_SITE_OTHER): Payer: Managed Care, Other (non HMO) | Admitting: Nurse Practitioner

## 2018-09-14 ENCOUNTER — Ambulatory Visit (HOSPITAL_BASED_OUTPATIENT_CLINIC_OR_DEPARTMENT_OTHER)
Admission: RE | Admit: 2018-09-14 | Discharge: 2018-09-14 | Disposition: A | Discharge status: Home / Self Care | Payer: Managed Care, Other (non HMO) | Source: Ambulatory Visit | Attending: Nurse Practitioner | Admitting: Nurse Practitioner

## 2018-09-14 ENCOUNTER — Encounter (HOSPITAL_COMMUNITY)
Admission: EM | Disposition: A | Discharge status: Home / Self Care | Payer: Self-pay | Source: Home / Self Care | Attending: Emergency Medicine

## 2018-09-14 ENCOUNTER — Observation Stay (HOSPITAL_COMMUNITY)
Admission: EM | Admit: 2018-09-14 | Discharge: 2018-09-15 | Disposition: A | Discharge status: Home / Self Care | Payer: Managed Care, Other (non HMO) | Attending: Surgery | Admitting: Surgery

## 2018-09-14 ENCOUNTER — Encounter: Payer: Self-pay | Admitting: Nurse Practitioner

## 2018-09-14 VITALS — BP 120/78 | HR 89 | Temp 98.2°F | Ht 61.0 in | Wt 141.0 lb

## 2018-09-14 DIAGNOSIS — R1031 Right lower quadrant pain: Secondary | ICD-10-CM | POA: Insufficient documentation

## 2018-09-14 DIAGNOSIS — R110 Nausea: Secondary | ICD-10-CM | POA: Diagnosis not present

## 2018-09-14 DIAGNOSIS — F329 Major depressive disorder, single episode, unspecified: Secondary | ICD-10-CM | POA: Diagnosis not present

## 2018-09-14 DIAGNOSIS — K3589 Other acute appendicitis without perforation or gangrene: Secondary | ICD-10-CM

## 2018-09-14 DIAGNOSIS — K3580 Unspecified acute appendicitis: Secondary | ICD-10-CM | POA: Diagnosis not present

## 2018-09-14 DIAGNOSIS — J45909 Unspecified asthma, uncomplicated: Secondary | ICD-10-CM | POA: Insufficient documentation

## 2018-09-14 DIAGNOSIS — R109 Unspecified abdominal pain: Secondary | ICD-10-CM | POA: Diagnosis present

## 2018-09-14 DIAGNOSIS — Z79899 Other long term (current) drug therapy: Secondary | ICD-10-CM | POA: Diagnosis not present

## 2018-09-14 HISTORY — DX: Unspecified acute appendicitis: K35.80

## 2018-09-14 HISTORY — PX: LAPAROSCOPIC APPENDECTOMY: SHX408

## 2018-09-14 LAB — BASIC METABOLIC PANEL
BUN: 11 mg/dL (ref 6–23)
CHLORIDE: 101 meq/L (ref 96–112)
CO2: 30 meq/L (ref 19–32)
Calcium: 10.4 mg/dL (ref 8.4–10.5)
Creatinine, Ser: 0.8 mg/dL (ref 0.40–1.20)
GFR: 74.39 mL/min (ref >60.00)
Glucose, Bld: 86 mg/dL (ref 70–99)
Potassium: 4.1 mEq/L (ref 3.5–5.1)
Sodium: 140 mEq/L (ref 135–145)

## 2018-09-14 LAB — CBC WITH DIFFERENTIAL/PLATELET
Abs Immature Granulocytes: 0.03 10*3/uL (ref 0.00–0.07)
Basophils Absolute: 0 10*3/uL (ref 0.0–0.1)
Basophils Relative: 1 %
Eosinophils Absolute: 0.2 10*3/uL (ref 0.0–0.5)
Eosinophils Relative: 3 %
HCT: 41.9 % (ref 36.0–46.0)
Hemoglobin: 13.3 g/dL (ref 12.0–15.0)
Immature Granulocytes: 0 %
Lymphocytes Relative: 18 %
Lymphs Abs: 1.4 10*3/uL (ref 0.7–4.0)
MCH: 27.3 pg (ref 26.0–34.0)
MCHC: 31.7 g/dL (ref 30.0–36.0)
MCV: 85.9 fL (ref 80.0–100.0)
MONO ABS: 0.6 10*3/uL (ref 0.1–1.0)
Monocytes Relative: 7 %
Neutro Abs: 5.7 10*3/uL (ref 1.7–7.7)
Neutrophils Relative %: 71 %
Platelets: 219 10*3/uL (ref 150–400)
RBC: 4.88 MIL/uL (ref 3.87–5.11)
RDW: 13.4 % (ref 11.5–15.5)
WBC: 8 10*3/uL (ref 4.0–10.5)
nRBC: 0 % (ref 0.0–0.2)

## 2018-09-14 LAB — POCT URINALYSIS DIPSTICK
Bilirubin, UA: NEGATIVE
Blood, UA: NEGATIVE
Glucose, UA: NEGATIVE
Ketones, UA: NEGATIVE
Leukocytes, UA: NEGATIVE
Nitrite, UA: NEGATIVE
Protein, UA: NEGATIVE
SPEC GRAV UA: 1.02 (ref 1.010–1.025)
Urobilinogen, UA: 0.2 E.U./dL
pH, UA: 7 (ref 5.0–8.0)

## 2018-09-14 SURGERY — APPENDECTOMY, LAPAROSCOPIC
Anesthesia: General

## 2018-09-14 MED ORDER — DIPHENHYDRAMINE HCL 50 MG/ML IJ SOLN: 25.0000 mg | Freq: Four times a day (QID) | INTRAMUSCULAR | Status: DC | PRN

## 2018-09-14 MED ORDER — ONDANSETRON HCL 4 MG/2ML IJ SOLN
INTRAMUSCULAR | Status: DC | PRN
  Administered 2018-09-14: 4 mg via INTRAVENOUS

## 2018-09-14 MED ORDER — SCOPOLAMINE 1 MG/3DAYS TD PT72
1.0000 | MEDICATED_PATCH | Freq: Once | TRANSDERMAL | Status: DC
  Administered 2018-09-14: 1.5 mg via TRANSDERMAL
  Filled 2018-09-14: qty 1

## 2018-09-14 MED ORDER — BUPIVACAINE-EPINEPHRINE 0.25% -1:200000 IJ SOLN
INTRAMUSCULAR | Status: AC
  Filled 2018-09-14: qty 1

## 2018-09-14 MED ORDER — PROPOFOL 10 MG/ML IV BOLUS
INTRAVENOUS | Status: DC | PRN
  Administered 2018-09-14: 150 mg via INTRAVENOUS

## 2018-09-14 MED ORDER — GABAPENTIN 300 MG PO CAPS
300.0000 mg | ORAL_CAPSULE | Freq: Once | ORAL | Status: AC
  Administered 2018-09-14: 300 mg via ORAL
  Filled 2018-09-14: qty 1

## 2018-09-14 MED ORDER — ROCURONIUM BROMIDE 50 MG/5ML IV SOSY
PREFILLED_SYRINGE | INTRAVENOUS | Status: DC | PRN
  Administered 2018-09-14: 50 mg via INTRAVENOUS

## 2018-09-14 MED ORDER — FENTANYL CITRATE (PF) 100 MCG/2ML IJ SOLN: 25.0000 ug | INTRAMUSCULAR | Status: DC | PRN

## 2018-09-14 MED ORDER — DIPHENHYDRAMINE HCL 50 MG/ML IJ SOLN
INTRAMUSCULAR | Status: DC | PRN
  Administered 2018-09-14: 25 mg via INTRAVENOUS

## 2018-09-14 MED ORDER — LACTATED RINGERS IV SOLN
INTRAVENOUS | Status: DC
  Administered 2018-09-14: 17:00:00 via INTRAVENOUS

## 2018-09-14 MED ORDER — 0.9 % SODIUM CHLORIDE (POUR BTL) OPTIME
TOPICAL | Status: DC | PRN
  Administered 2018-09-14: 1000 mL

## 2018-09-14 MED ORDER — ZOLPIDEM TARTRATE 5 MG PO TABS: 5.0000 mg | ORAL_TABLET | Freq: Every evening | ORAL | Status: DC | PRN

## 2018-09-14 MED ORDER — MIDAZOLAM HCL 5 MG/5ML IJ SOLN
INTRAMUSCULAR | Status: DC | PRN
  Administered 2018-09-14: 2 mg via INTRAVENOUS

## 2018-09-14 MED ORDER — ONDANSETRON HCL 4 MG PO TABS: 4.0000 mg | ORAL_TABLET | Freq: Three times a day (TID) | ORAL | 0 refills | Status: DC | PRN

## 2018-09-14 MED ORDER — SCOPOLAMINE 1 MG/3DAYS TD PT72
MEDICATED_PATCH | TRANSDERMAL | Status: DC | PRN
  Administered 2018-09-14: 1 via TRANSDERMAL

## 2018-09-14 MED ORDER — ENOXAPARIN SODIUM 40 MG/0.4ML ~~LOC~~ SOLN: 40.0000 mg | SUBCUTANEOUS | Status: DC

## 2018-09-14 MED ORDER — LIDOCAINE HCL (CARDIAC) PF 100 MG/5ML IV SOSY
PREFILLED_SYRINGE | INTRAVENOUS | Status: DC | PRN
  Administered 2018-09-14: 30 mg via INTRAVENOUS

## 2018-09-14 MED ORDER — DIPHENHYDRAMINE HCL 25 MG PO CAPS: 25.0000 mg | ORAL_CAPSULE | Freq: Four times a day (QID) | ORAL | Status: DC | PRN

## 2018-09-14 MED ORDER — METRONIDAZOLE IN NACL 5-0.79 MG/ML-% IV SOLN
500.0000 mg | Freq: Once | INTRAVENOUS | Status: AC
  Administered 2018-09-14: 500 mg via INTRAVENOUS
  Filled 2018-09-14: qty 100

## 2018-09-14 MED ORDER — METOPROLOL TARTRATE 5 MG/5ML IV SOLN: 5.0000 mg | Freq: Four times a day (QID) | INTRAVENOUS | Status: DC | PRN

## 2018-09-14 MED ORDER — LACTATED RINGERS IV SOLN
INTRAVENOUS | Status: DC | PRN
  Administered 2018-09-14 (×2): via INTRAVENOUS

## 2018-09-14 MED ORDER — KCL IN DEXTROSE-NACL 20-5-0.45 MEQ/L-%-% IV SOLN
INTRAVENOUS | Status: DC
  Administered 2018-09-14: 21:00:00 via INTRAVENOUS
  Filled 2018-09-14: qty 1000

## 2018-09-14 MED ORDER — FENTANYL CITRATE (PF) 250 MCG/5ML IJ SOLN
INTRAMUSCULAR | Status: AC
  Filled 2018-09-14: qty 5

## 2018-09-14 MED ORDER — PROMETHAZINE HCL 25 MG/ML IJ SOLN: 6.2500 mg | INTRAMUSCULAR | Status: DC | PRN

## 2018-09-14 MED ORDER — SUGAMMADEX SODIUM 200 MG/2ML IV SOLN
INTRAVENOUS | Status: DC | PRN
  Administered 2018-09-14: 200 mg via INTRAVENOUS

## 2018-09-14 MED ORDER — IOPAMIDOL (ISOVUE-300) INJECTION 61%
100.0000 mL | Freq: Once | INTRAVENOUS | Status: AC | PRN
  Administered 2018-09-14: 100 mL via INTRAVENOUS

## 2018-09-14 MED ORDER — TRAMADOL HCL 50 MG PO TABS: 50.0000 mg | ORAL_TABLET | Freq: Two times a day (BID) | ORAL | 0 refills | Status: AC | PRN

## 2018-09-14 MED ORDER — ONDANSETRON HCL 4 MG/2ML IJ SOLN: 4.0000 mg | Freq: Four times a day (QID) | INTRAMUSCULAR | Status: DC | PRN

## 2018-09-14 MED ORDER — METHOCARBAMOL 500 MG PO TABS
500.0000 mg | ORAL_TABLET | Freq: Four times a day (QID) | ORAL | Status: DC | PRN
  Administered 2018-09-14: 500 mg via ORAL
  Filled 2018-09-14: qty 1

## 2018-09-14 MED ORDER — HYDROMORPHONE HCL 1 MG/ML IJ SOLN
0.5000 mg | INTRAMUSCULAR | Status: DC | PRN
  Administered 2018-09-14: 0.5 mg via INTRAVENOUS
  Filled 2018-09-14: qty 1

## 2018-09-14 MED ORDER — POLYETHYLENE GLYCOL 3350 17 G PO PACK: 17.0000 g | PACK | Freq: Every day | ORAL | Status: DC | PRN

## 2018-09-14 MED ORDER — BUPIVACAINE-EPINEPHRINE 0.25% -1:200000 IJ SOLN
INTRAMUSCULAR | Status: DC | PRN
  Administered 2018-09-14: 20 mL

## 2018-09-14 MED ORDER — MORPHINE SULFATE (PF) 4 MG/ML IV SOLN
4.0000 mg | Freq: Once | INTRAVENOUS | Status: AC
  Administered 2018-09-14: 4 mg via INTRAVENOUS
  Filled 2018-09-14: qty 1

## 2018-09-14 MED ORDER — DEXAMETHASONE SODIUM PHOSPHATE 10 MG/ML IJ SOLN
INTRAMUSCULAR | Status: DC | PRN
  Administered 2018-09-14: 10 mg via INTRAVENOUS

## 2018-09-14 MED ORDER — HYDRALAZINE HCL 20 MG/ML IJ SOLN: 10.0000 mg | INTRAMUSCULAR | Status: DC | PRN

## 2018-09-14 MED ORDER — FENTANYL CITRATE (PF) 100 MCG/2ML IJ SOLN
INTRAMUSCULAR | Status: DC | PRN
  Administered 2018-09-14: 100 ug via INTRAVENOUS

## 2018-09-14 MED ORDER — ONDANSETRON 4 MG PO TBDP: 4.0000 mg | ORAL_TABLET | Freq: Four times a day (QID) | ORAL | Status: DC | PRN

## 2018-09-14 MED ORDER — ACETAMINOPHEN 500 MG PO TABS
1000.0000 mg | ORAL_TABLET | Freq: Once | ORAL | Status: AC
  Administered 2018-09-14: 1000 mg via ORAL
  Filled 2018-09-14: qty 2

## 2018-09-14 MED ORDER — SODIUM CHLORIDE 0.9 % IV SOLN
2.0000 g | Freq: Once | INTRAVENOUS | Status: AC
  Administered 2018-09-14: 2 g via INTRAVENOUS
  Filled 2018-09-14 (×3): qty 20

## 2018-09-14 MED ORDER — ACETAMINOPHEN 500 MG PO TABS
1000.0000 mg | ORAL_TABLET | Freq: Four times a day (QID) | ORAL | Status: DC
  Administered 2018-09-14 – 2018-09-15 (×3): 1000 mg via ORAL
  Filled 2018-09-14 (×3): qty 2

## 2018-09-14 MED ORDER — TRAMADOL HCL 50 MG PO TABS
50.0000 mg | ORAL_TABLET | Freq: Four times a day (QID) | ORAL | Status: DC | PRN
  Administered 2018-09-14 – 2018-09-15 (×3): 50 mg via ORAL
  Filled 2018-09-14 (×3): qty 1

## 2018-09-14 MED ORDER — IBUPROFEN 600 MG PO TABS: 600.0000 mg | ORAL_TABLET | Freq: Three times a day (TID) | ORAL | Status: DC | PRN

## 2018-09-14 MED ORDER — SODIUM CHLORIDE 0.9 % IR SOLN
Status: DC | PRN
  Administered 2018-09-14: 1000 mL

## 2018-09-14 MED ORDER — ONDANSETRON HCL 4 MG/2ML IJ SOLN
4.0000 mg | Freq: Once | INTRAMUSCULAR | Status: AC
  Administered 2018-09-14: 4 mg via INTRAVENOUS
  Filled 2018-09-14: qty 2

## 2018-09-14 MED ORDER — SIMETHICONE 80 MG PO CHEW: 40.0000 mg | CHEWABLE_TABLET | Freq: Four times a day (QID) | ORAL | Status: DC | PRN

## 2018-09-14 SURGICAL SUPPLY — 44 items
ADH SKN CLS APL DERMABOND .7 (GAUZE/BANDAGES/DRESSINGS) ×1
APPLIER CLIP 5 13 M/L LIGAMAX5 (MISCELLANEOUS)
APR CLP MED LRG 5 ANG JAW (MISCELLANEOUS)
BAG SPEC RTRVL LRG 6X4 10 (ENDOMECHANICALS) ×1
BLADE CLIPPER SURG (BLADE) IMPLANT
CANISTER SUCT 3000ML PPV (MISCELLANEOUS) ×3 IMPLANT
CHLORAPREP W/TINT 26ML (MISCELLANEOUS) ×3 IMPLANT
CLIP APPLIE 5 13 M/L LIGAMAX5 (MISCELLANEOUS) IMPLANT
COVER SURGICAL LIGHT HANDLE (MISCELLANEOUS) ×3 IMPLANT
COVER WAND RF STERILE (DRAPES) ×3 IMPLANT
CUTTER FLEX LINEAR 45M (STAPLE) ×3 IMPLANT
DERMABOND ADVANCED (GAUZE/BANDAGES/DRESSINGS) ×2
DERMABOND ADVANCED .7 DNX12 (GAUZE/BANDAGES/DRESSINGS) ×1 IMPLANT
ELECT REM PT RETURN 9FT ADLT (ELECTROSURGICAL) ×3
ELECTRODE REM PT RTRN 9FT ADLT (ELECTROSURGICAL) ×1 IMPLANT
GLOVE BIO SURGEON STRL SZ7.5 (GLOVE) ×3 IMPLANT
GLOVE INDICATOR 8.0 STRL GRN (GLOVE) ×3 IMPLANT
GOWN STRL REUS W/ TWL LRG LVL3 (GOWN DISPOSABLE) ×2 IMPLANT
GOWN STRL REUS W/ TWL XL LVL3 (GOWN DISPOSABLE) ×1 IMPLANT
GOWN STRL REUS W/TWL LRG LVL3 (GOWN DISPOSABLE) ×6
GOWN STRL REUS W/TWL XL LVL3 (GOWN DISPOSABLE) ×3
KIT BASIN OR (CUSTOM PROCEDURE TRAY) ×3 IMPLANT
KIT TURNOVER KIT B (KITS) ×3 IMPLANT
NS IRRIG 1000ML POUR BTL (IV SOLUTION) ×3 IMPLANT
PAD ARMBOARD 7.5X6 YLW CONV (MISCELLANEOUS) ×6 IMPLANT
POUCH SPECIMEN RETRIEVAL 10MM (ENDOMECHANICALS) ×3 IMPLANT
RELOAD 45 VASCULAR/THIN (ENDOMECHANICALS) IMPLANT
RELOAD STAPLE 45 2.5 WHT GRN (ENDOMECHANICALS) IMPLANT
RELOAD STAPLE 45 3.5 BLU ETS (ENDOMECHANICALS) IMPLANT
RELOAD STAPLE TA45 3.5 REG BLU (ENDOMECHANICALS) ×3 IMPLANT
SCISSORS LAP 5X35 DISP (ENDOMECHANICALS) ×2 IMPLANT
SET IRRIG TUBING LAPAROSCOPIC (IRRIGATION / IRRIGATOR) ×3 IMPLANT
SET TUBE SMOKE EVAC HIGH FLOW (TUBING) ×3 IMPLANT
SHEARS HARMONIC ACE PLUS 36CM (ENDOMECHANICALS) ×2 IMPLANT
SLEEVE ENDOPATH XCEL 5M (ENDOMECHANICALS) ×3 IMPLANT
SPECIMEN JAR SMALL (MISCELLANEOUS) ×3 IMPLANT
SUT MNCRL AB 4-0 PS2 18 (SUTURE) ×3 IMPLANT
TOWEL OR 17X24 6PK STRL BLUE (TOWEL DISPOSABLE) ×3 IMPLANT
TOWEL OR 17X26 10 PK STRL BLUE (TOWEL DISPOSABLE) ×3 IMPLANT
TRAY FOLEY CATH SILVER 16FR (SET/KITS/TRAYS/PACK) ×3 IMPLANT
TRAY LAPAROSCOPIC MC (CUSTOM PROCEDURE TRAY) ×3 IMPLANT
TROCAR XCEL BLUNT TIP 100MML (ENDOMECHANICALS) ×3 IMPLANT
TROCAR XCEL NON-BLD 5MMX100MML (ENDOMECHANICALS) ×3 IMPLANT
WATER STERILE IRR 1000ML POUR (IV SOLUTION) ×3 IMPLANT

## 2018-09-14 NOTE — Plan of Care (Signed)
  Problem: Clinical Measurements: Goal: Postoperative complications will be avoided or minimized Outcome: Progressing   Problem: Skin Integrity: Goal: Demonstration of wound healing without infection will improve Outcome: Progressing   

## 2018-09-14 NOTE — Telephone Encounter (Signed)
Left vm for Brandy to call back. This might have to do with the order for CT that the pt just had it done. FYI

## 2018-09-14 NOTE — Progress Notes (Signed)
Subjective:  Patient ID: Yolanda [name], female    DOB: May 21, 1961  Age: 58 y.o. MRN: [id number]  CC: Pain (rt side abdominal pain/ started at top-- dull burning pain, now lower abdomen-- sharp pain/mainly with movement/ 3 days/ ibuprofen)  Abdominal Pain  This is a new problem. The current episode started in the past 7 days. The onset quality is sudden. The problem occurs constantly. The problem has been unchanged. The pain is located in the epigastric region and suprapubic region. The quality of the pain is a sensation of fullness. The abdominal pain radiates to the periumbilical region. Associated symptoms include anorexia, diarrhea, myalgias and nausea. Pertinent negatives include no constipation or fever. The pain is aggravated by movement. The pain is relieved by being still. She has tried nothing for the symptoms. Her past medical history is significant for GERD and irritable bowel syndrome.   Reviewed past Medical, Social and Family history today.  Outpatient Medications Prior to Visit  Medication Sig Dispense Refill  . albuterol (PROVENTIL HFA;VENTOLIN HFA) 108 (90 Base) MCG/ACT inhaler Inhale 2 puffs into the lungs every 6 (six) hours as needed for wheezing or shortness of breath. 1 Inhaler 2  . APLENZIN 174 MG TB24 Take 1 tablet by mouth daily.  0  . Calcium Carbonate (CALCIUM 600 PO) Take 1 tablet by mouth daily.    Marland Kitchen EPINEPHrine 0.3 mg/0.3 mL IJ SOAJ injection Inject 0.3 mg into the muscle as needed for anaphylaxis.   2  . LORazepam (ATIVAN) 0.5 MG tablet Take 0.5 mg by mouth daily as needed for sleep.     Marland Kitchen MAGNESIUM GLYCINATE PLUS PO Take 2 capsules by mouth at bedtime.    . mometasone (NASONEX) 50 MCG/ACT nasal spray Place 2 sprays into the nose daily. 17 g 5  . montelukast (SINGULAIR) 10 MG tablet TAKE 1 TABLET(10 MG) BY MOUTH AT BEDTIME (Patient taking differently: Take 10 mg by mouth at bedtime. ) 30 tablet 5  . Multiple Vitamin (MULTIVITAMIN) tablet Take 1 tablet by mouth 2  (two) times daily.     . Omega-3 Fatty Acids (OMEGAPURE 780 EC PO) Take 1 tablet by mouth daily.    . risedronate (ACTONEL) 150 MG tablet TAKE ONE TABLET(150MG ) BY MOUTH EVERY 30 DAYS WITH WATER ON EMPTY STOMAACH NOTHING BY MOUTH AND DON'T LIE DOWN FOR 30 MINUTES (Patient taking differently: Take 150 mg by mouth every 30 (thirty) days. WITH WATER ON EMPTY STOMAACH NOTHING BY MOUTH AND DON'T LIE DOWN FOR 30 MINUTES) 3 tablet 4  . hyoscyamine (LEVSIN SL) 0.125 MG SL tablet Place 0.125 mg under the tongue every 4 (four) hours as needed.    Marland Kitchen olopatadine (PATANOL) 0.1 % ophthalmic solution Place 1 drop into both eyes 2 (two) times daily. (Patient not taking: Reported on 09/14/2018) 5 mL 12  . triamcinolone cream (KENALOG) 0.1 % APP EXT AA BID FOR 14 DAYS  1  . DIGESTIVE ENZYMES PO Take by mouth. Primary Digest Gastro-Digestive Enzyme take 1 before each meal     No facility-administered medications prior to visit.     ROS See HPI  Objective:  BP 120/78   Pulse 89   Temp 98.2 F (36.8 C) (Oral)   Ht 5\' 1"  (1.549 m)   Wt 141 lb (64 kg)   SpO2 97%   BMI 26.64 kg/m   BP Readings from Last 3 Encounters:  09/15/18 (!) 103/59  09/14/18 120/78  09/04/18 118/74    Wt Readings from Last 3 Encounters:  09/14/18 141 lb 1.5 oz (64 kg)  09/14/18 141 lb (64 kg)  09/04/18 142 lb (64.4 kg)    Physical Exam Constitutional:      Appearance: She is ill-appearing. She is not toxic-appearing or diaphoretic.  Cardiovascular:     Rate and Rhythm: Normal rate.  Pulmonary:     Effort: Pulmonary effort is normal.     Breath sounds: Normal breath sounds.  Abdominal:     General: Bowel sounds are normal. There is no distension.     Palpations: There is no mass.     Tenderness: There is abdominal tenderness in the periumbilical area and suprapubic area. There is guarding. There is no right CVA tenderness or left CVA tenderness. Positive signs include McBurney's sign. Negative signs include Murphy's sign.      Hernia: No hernia is present.  Neurological:     Mental Status: She is alert and oriented to person, place, and time.     Lab Results  Component Value Date   WBC 6.0 09/15/2018   HGB 11.4 (L) 09/15/2018   HCT 34.5 (L) 09/15/2018   PLT 184 09/15/2018   GLUCOSE 86 09/14/2018   CHOL 126 03/20/2018   TRIG 69.0 03/20/2018   HDL 57.60 03/20/2018   LDLCALC 55 03/20/2018   ALT 28 03/20/2018   AST 30 03/20/2018   NA 140 09/14/2018   K 4.1 09/14/2018   CL 101 09/14/2018   CREATININE 0.80 09/14/2018   BUN 11 09/14/2018   CO2 30 09/14/2018   TSH 1.31 03/20/2018   HGBA1C 5.6 04/24/2018     Assessment & Plan:   Lakiah was seen today for pain.  Diagnoses and all orders for this visit:  Acute appendicitis, unspecified acute appendicitis type  Nausea -     Discontinue: ondansetron (ZOFRAN) 4 MG tablet; Take 1 tablet (4 mg total) by mouth every 8 (eight) hours as needed for nausea or vomiting. (Patient not taking: Reported on 09/14/2018)  Right lower quadrant abdominal pain -     POCT urinalysis dipstick -     Basic metabolic panel -     Cancel: CT ABDOMEN W WO CONTRAST; Future -     traMADol (ULTRAM) 50 MG tablet; Take 1 tablet (50 mg total) by mouth every 12 (twelve) hours as needed for up to 3 days. (Patient not taking: Reported on 09/14/2018) -     CT ABDOMEN PELVIS W CONTRAST; Future   I am having Cyriah C. Mccoy start on traMADol. I am also having her maintain her multivitamin, LORazepam, APLENZIN, MAGNESIUM GLYCINATE PLUS PO, Omega-3 Fatty Acids (OMEGAPURE 780 EC PO), Calcium Carbonate (CALCIUM 600 PO), mometasone, EPINEPHrine, albuterol, montelukast, and risedronate.  Meds ordered this encounter  Medications  . DISCONTD: ondansetron (ZOFRAN) 4 MG tablet    Sig: Take 1 tablet (4 mg total) by mouth every 8 (eight) hours as needed for nausea or vomiting.    Dispense:  20 tablet    Refill:  0    Order Specific Question:   Supervising Provider    Answer:   Lucille Passy  [[id number]]  . traMADol (ULTRAM) 50 MG tablet    Sig: Take 1 tablet (50 mg total) by mouth every 12 (twelve) hours as needed for up to 3 days.    Dispense:  6 tablet    Refill:  0    Order Specific Question:   Supervising Provider    Answer:   Lucille Passy [[id number]]    Problem List Items Addressed  This Visit      Digestive   Acute appendicitis - Primary    Other Visit Diagnoses    Nausea       Right lower quadrant abdominal pain       Relevant Medications   traMADol (ULTRAM) 50 MG tablet   Other Relevant Orders   POCT urinalysis dipstick (Completed)   Basic metabolic panel (Completed)   CT ABDOMEN PELVIS W CONTRAST (Completed)       Follow-up: No follow-ups on file.  Wilfred Lacy, NP

## 2018-09-14 NOTE — Patient Instructions (Addendum)
Urinalysis is normal.  Use tramadol for pain as needed  Use zofran for nausea as needed  Go to hospital if pain worsens.  Normal BMP Ct indicates acute appendicitis. She was advised to go to hospital for surgical intervention. She agreed to go to [facility].   Abdominal Pain, Adult  Many things can cause belly (abdominal) pain. Most times, belly pain is not dangerous. Many cases of belly pain can be watched and treated at home. Sometimes belly pain is serious, though. Your doctor will try to find the cause of your belly pain. Follow these instructions at home:  Take over-the-counter and prescription medicines only as told by your doctor. Do not take medicines that help you poop (laxatives) unless told to by your doctor.  Drink enough fluid to keep your pee (urine) clear or pale yellow.  Watch your belly pain for any changes.  Keep all follow-up visits as told by your doctor. This is important. Contact a doctor if:  Your belly pain changes or gets worse.  You are not hungry, or you lose weight without trying.  You are having trouble pooping (constipated) or have watery poop (diarrhea) for more than 2-3 days.  You have pain when you pee or poop.  Your belly pain wakes you up at night.  Your pain gets worse with meals, after eating, or with certain foods.  You are throwing up and cannot keep anything down.  You have a fever. Get help right away if:  Your pain does not go away as soon as your doctor says it should.  You cannot stop throwing up.  Your pain is only in areas of your belly, such as the right side or the left lower part of the belly.  You have bloody or black poop, or poop that looks like tar.  You have very bad pain, cramping, or bloating in your belly.  You have signs of not having enough fluid or water in your body (dehydration), such as: ? Dark pee, very little pee, or no pee. ? Cracked lips. ? Dry mouth. ? Sunken  eyes. ? Sleepiness. ? Weakness. This information is not intended to replace advice given to you by your health care provider. Make sure you discuss any questions you have with your health care provider. Document Released: 01/25/2008 Document Revised: 02/26/2016 Document Reviewed: 01/20/2016 Elsevier Interactive Patient Education  2019 [employer].

## 2018-09-14 NOTE — Anesthesia Preprocedure Evaluation (Addendum)
Anesthesia Evaluation  Patient identified by MRN, date of birth, ID band Patient awake    Reviewed: Allergy & Precautions, NPO status , Patient's Chart, lab work & pertinent test results  Airway Mallampati: III  TM Distance: >3 FB Neck ROM: Full  Mouth opening: Limited Mouth Opening  Dental  (+) Teeth Intact, Dental Advisory Given   Pulmonary asthma ,    Pulmonary exam normal breath sounds clear to auscultation       Cardiovascular Exercise Tolerance: Good negative cardio ROS Normal cardiovascular exam Rhythm:Regular Rate:Normal     Neuro/Psych PSYCHIATRIC DISORDERS Depression negative neurological ROS     GI/Hepatic Neg liver ROS, hiatal hernia, GERD  ,Esophagitis    Endo/Other  negative endocrine ROS  Renal/GU negative Renal ROS   Interstitial cystitis     Musculoskeletal negative musculoskeletal ROS (+)   Abdominal   Peds  Hematology negative hematology ROS (+)   Anesthesia Other Findings Day of surgery medications reviewed with the patient.  Reproductive/Obstetrics negative OB ROS                            Anesthesia Physical Anesthesia Plan  ASA: II  Anesthesia Plan: General   Post-op Pain Management:    Induction: Intravenous  PONV Risk Score and Plan: 4 or greater and Scopolamine patch - Pre-op, Diphenhydramine, Midazolam, Dexamethasone and Ondansetron  Airway Management Planned: Oral ETT  Additional Equipment:   Intra-op Plan:   Post-operative Plan: Extubation in OR  Informed Consent: I have reviewed the patients History and Physical, chart, labs and discussed the procedure including the risks, benefits and alternatives for the proposed anesthesia with the patient or authorized representative who has indicated his/her understanding and acceptance.     Dental advisory given  Plan Discussed with: CRNA  Anesthesia Plan Comments:         Anesthesia  Quick Evaluation

## 2018-09-14 NOTE — Transfer of Care (Signed)
Immediate Anesthesia Transfer of Care Note  Patient: Yolanda [name]  Procedure(s) Performed: APPENDECTOMY LAPAROSCOPIC (N/A )  Patient Location: PACU  Anesthesia Type:General  Level of Consciousness: awake, alert  and oriented  Airway & Oxygen Therapy: Patient Spontanous Breathing and Patient connected to nasal cannula oxygen  Post-op Assessment: Report given to RN and Post -op Vital signs reviewed and stable  Post vital signs: Reviewed and stable  Last Vitals:  Vitals Value Taken Time  BP 100/67 09/14/2018  5:57 PM  Temp    Pulse 87 09/14/2018  5:58 PM  Resp 22 09/14/2018  5:58 PM  SpO2 98 % 09/14/2018  5:58 PM  Vitals shown include unvalidated device data.  Last Pain:  Vitals:   09/14/18 1757  TempSrc:   PainSc: (P) 0-No pain         Complications: No apparent anesthesia complications

## 2018-09-14 NOTE — Op Note (Signed)
Yolanda [name] [id number]   PRE-OPERATIVE DIAGNOSIS:  Acute appendicitis  POST-OPERATIVE DIAGNOSIS:  Same  Procedure(s): APPENDECTOMY LAPAROSCOPIC  SURGEON:  Sharon Mt. Keyan Folson, M.D.  ASSISTANT: OR staff  ANESTHESIA: General endotracheal  EBL:   5 mL  DRAINS: None  SPECIMEN:  Appendix  COUNTS:  Sponge, needle and instrument counts were reported correct x2 at conclusion of the operation  DISPOSITION:  PACU in satisfactory condition  COMPLICATIONS: None  FINDINGS: Acute appendicitis without perforation. Hyperemic, inflamed. Normal appearing cecum and terminal ileum. No other intra-abdominal abnormalities noted - uterus/ovaries/tubes surgically absent. Some small bowel adhesions in pelvis.  INDICATIONS:  Yolanda [name] is a very pleasant 65yoF whom presented to the emergency department today with complaints of a 1 day history of abdominal discomfort.  This was previously in the mid epigastrium/periumbilical region but subsequently has localized to the right lower quadrant.  She was seen by her PCP today who ordered a CT scan.  This demonstrated findings consistent with acute appendicitis with a dilated hyperemic appendix.  Options were discussed with her moving forward and she opted to undergo surgical intervention.  Please refer to notes elsewhere in the medical record for details regarding this discussion.  DESCRIPTION:   The patient was identified & brought into the operating room. SCDs were in place and functioning. General endotracheal anesthesia was administered. Preoperative antibiotics were administered. The patient was positioned supine. A foley catheter was inserted under sterile conditions. The abdomen was prepped and draped in the standard sterile fashion. A surgical timeout confirmed our plan.  A small incision was made in the supraumbilical position. The subcutaneous tissue was dissected and the umbilical stalk identified. The stalk was grasped with a Kocher and  retracted outwardly. The infraumbilical fascia was exposed and incised. Peritoneal entry was carefully made bluntly. An 0 Vicryl purse-string suture was placed and then the [facility] port was introduced into the abdomen.  CO2 insufflation commenced to 52mmHg. The laparoscope was inserted and confirmed no evidence of injury. The patient was then positioned in Trendelenburg. Two additional ports were placed - one in left lower quadrant and another in the suprapubic midline well above the location of the bladder, 3 fingerbreadths above the pubic symphysis. The patient was then placed in the left side down position.  The appendix was identified lying down towards the right hemipelvis.  The appendix was elevated.  The base of the appendix was circumferentially dissected and was located well away from the terminal ileum and ileocecal valve. The base was noted to be viable and healthy appearing.  The cecum and terminal ileum appeared normal.  The appendix was then stapled with a laparoscopic stapler using a blue load, taking a small healthy cuff of viable cecum.  Care was taken to stay clear of the ileocecal valve. The mesoappendix was then ligated using the harmonic scalpel. The appendix was placed in an EndoBag and extracted through the umbilical port site and passed off the specimen.  The right lower quadrant was irrigated. Hemostasis was noted to be achieved - taking time to inspect the ligated mesoappendix, colon mesentery, and retroperitoneum. Staple line was noted to be intact on the cecum with no bleeding. There was no perforation or injury.  The pelvis was inspected and there were no abnormalities noted aside from small amount of small bowel adhesions.  Uterus, tubes and ovaries are surgically absent.  The liver appeared normal.  The colon and omentum appeared normal.  The visualized intra-abdominal small bowel appeared normal.  The left lower  quadrant and suprapubic ports were removed under direct  visualization and hemostasis was noted at the port sites. The CO2 was exhausted from the abdomen. The umbilical fascia was then closed using the 0 Vicryl suture. The skin of all port sites was then approximated using 4-0 Monocryl suture. The incisions were dressed with Dermabond.  She was then awakened from anesthesia, extubated and transferred to a stretcher for transport to PACU in satisfactory condition.

## 2018-09-14 NOTE — Telephone Encounter (Signed)
Copied from [place] [id number]. Topic: General - Other >> Sep 14, 2018  3:14 PM Keene Breath wrote: Reason for CRM: Theadora Rama called to clarify a PA.  She stated that the NPI is incorrect.  Please advise and call back at [phone], ext. [phone]

## 2018-09-14 NOTE — ED Triage Notes (Signed)
Pt to ER for confirmed appendicitis by CT scan today. Pt in NAD. Denies n/v and fever, reports diarrhea onset today.

## 2018-09-14 NOTE — ED Provider Notes (Signed)
[facility] EMERGENCY DEPARTMENT Provider Note   CSN: [id number] Arrival date & time: 09/14/18  1437     History   Chief Complaint Chief Complaint  Patient presents with  . Abdominal Pain    HPI Yolanda [name] is a 58 y.o. female presenting for evaluation abdominal pain and nausea.  Patient states abdominal pain began 2 days ago.  It is initially epigastric, but now has moved to the right lower quadrant.  She reports nausea without vomiting.  Pain is worse with movement, improved with rest.  She denies fevers, chills.  Patient saw her PCP, had an outpatient CT which was positive for acute appendicitis.  She reports some mild diarrhea, but no urinary symptoms.  She has a history of cholecystectomy, salpingectomy and hysterectomy.  She has not had anything for pain.    Additional history obtained from chart review, CT from today reviewed, showing acute appendicitis.  No signs of perforation at this time.  BMP and urine reviewed.  HPI  Past Medical History:  Diagnosis Date  . Allergy   . BRCA negative 12/2014  . Depression   . Esophagitis   . GERD (gastroesophageal reflux disease)   . Hiatal hernia   . History of diverticulitis   . IC (interstitial cystitis)   . Osteoporosis 10/2016   T score -2.6  . Schatzki's ring     Patient Active Problem List   Diagnosis Date Noted  . Well woman exam without gynecological exam 03/20/2018  . Food allergy 03/20/2018  . SOB (shortness of breath) on exertion 03/20/2018  . Genetic testing 12/23/2014  . Family history of breast cancer   . Family history of uterine cancer   . Osteoporosis 10/04/2014  . Pancreatic duct dilated 05/10/2012  . Depression   . IC (interstitial cystitis)   . FATIQUE AND MALAISE 10/27/2008  . IBS 08/01/2008  . UNS ADVRS EFF UNS RX MEDICINAL&BIOLOGICAL SBSTNC 01/21/2008  . Depression, recurrent ([place]) 01/18/2008  . RHINITIS, CHRONIC 01/18/2008    Past Surgical History:  Procedure  Laterality Date  . CHOLECYSTECTOMY    . HYSTEROSCOPY  12/2003   RESECTON OF SUBMUCOUS  MYOMA/DX SCOPE  . LAPAROSCOPIC BILATERAL SALPINGO OOPHERECTOMY Bilateral 07/02/2014   Procedure: LAPAROSCOPIC BILATERAL SALPINGO OOPHORECTOMY;  Surgeon: Anastasio Auerbach, MD;  Location: [facility];  Service: Gynecology;  Laterality: Bilateral;  . PELVIC LAPAROSCOPY  12/2003   DX SCOPE/RESECTION OF SUBMUCOUS MYOMA  . TMJ ARTHROSCOPY  11-26-2001  . VAGINAL HYSTERECTOMY  12/05   Menorrhagia/dysmenorrhea. Pathology showed leiomyoma/adenomyosis     OB History    Gravida  2   Para  2   Term  2   Preterm      AB      Living  2     SAB      TAB      Ectopic      Multiple      Live Births  2            Home Medications    Prior to Admission medications   Medication Sig Start Date End Date Taking? Authorizing Provider  albuterol (PROVENTIL HFA;VENTOLIN HFA) 108 (90 Base) MCG/ACT inhaler Inhale 2 puffs into the lungs every 6 (six) hours as needed for wheezing or shortness of breath. 04/18/18   Kozlow, Donnamarie Poag, MD  APLENZIN 174 MG TB24 Take 1 tablet by mouth daily. 06/12/17   [provider]  Calcium Carbonate (CALCIUM 600 PO) Take 1 tablet by mouth daily.  [provider]  DIGESTIVE ENZYMES PO Take by mouth. Primary Digest Gastro-Digestive Enzyme take 1 before each meal    [provider]  EPINEPHrine 0.3 mg/0.3 mL IJ SOAJ injection INJECT 1 PEN INTO MUSCLE FOR ONE DOSE IF NEEDED 03/20/18   [provider]  hyoscyamine (LEVSIN SL) 0.125 MG SL tablet Place 0.125 mg under the tongue every 4 (four) hours as needed.    [provider]  LORazepam (ATIVAN) 0.5 MG tablet Take 0.5 mg by mouth daily as needed for sleep.     [provider]  MAGNESIUM GLYCINATE PLUS PO Take 2 capsules by mouth at bedtime.    [provider]  mometasone (NASONEX) 50 MCG/ACT nasal spray Place 2 sprays into the nose daily. 03/20/18   Lucille Passy, MD    montelukast (SINGULAIR) 10 MG tablet TAKE 1 TABLET(10 MG) BY MOUTH AT BEDTIME 08/23/18   Lucille Passy, MD  Multiple Vitamin (MULTIVITAMIN) tablet Take 1 tablet by mouth daily.      [provider]  olopatadine (PATANOL) 0.1 % ophthalmic solution Place 1 drop into both eyes 2 (two) times daily. 10/04/14   Tereasa Coop, PA-C  Omega-3 Fatty Acids (OMEGAPURE 780 EC PO) Take 1 tablet by mouth daily.    [provider]  ondansetron (ZOFRAN) 4 MG tablet Take 1 tablet (4 mg total) by mouth every 8 (eight) hours as needed for nausea or vomiting. 09/14/18   Nche, Charlene Brooke, NP  risedronate (ACTONEL) 150 MG tablet TAKE ONE TABLET(150MG) BY MOUTH EVERY 30 DAYS WITH WATER ON EMPTY STOMAACH NOTHING BY MOUTH AND DON'T LIE DOWN FOR 30 MINUTES 09/04/18   Fontaine, Belinda Block, MD  traMADol (ULTRAM) 50 MG tablet Take 1 tablet (50 mg total) by mouth every 12 (twelve) hours as needed for up to 3 days. 09/14/18 09/17/18  Nche, Charlene Brooke, NP  triamcinolone cream (KENALOG) 0.1 % APP EXT AA BID FOR 14 DAYS 01/05/18   [provider]    Family History Family History  Problem Relation Age of Onset  . Uterine cancer Maternal Grandmother        dx in her 39s  . Breast cancer Sister        paternal half sister dx in her early 14s  . Cystic fibrosis Other        brother's granddauther  . Emphysema Father   . Early death Father   . Breast cancer Sister        paternal half sister dx in her late 65s  . Cystic fibrosis Other        brothers granddaughters  . Depression Mother   . Hyperlipidemia Mother   . Hypertension Mother   . Mental illness Mother   . Miscarriages / [place] Mother   . Diabetes Brother 50       HALF BROTHER/HALF BROTHER   . Breast cancer Sister   . GI problems Brother   . Colon cancer Neg Hx   . Esophageal cancer Neg Hx   . Rectal cancer Neg Hx   . Stomach cancer Neg Hx   . Allergic rhinitis Neg Hx   . Asthma Neg Hx   . Eczema Neg Hx   . Urticaria Neg Hx      Social History Social History   Tobacco Use  . Smoking status: Never Smoker  . Smokeless tobacco: Never Used  Substance Use Topics  . Alcohol use: Yes    Alcohol/week: 2.0 standard drinks  Types: 2 Glasses of wine per week    Comment: week  . Drug use: No     Allergies   Honey bee treatment [bee venom]; Eggs or egg-derived products; and Wheat bran   Review of Systems Review of Systems  Gastrointestinal: Positive for abdominal pain, diarrhea and nausea.  All other systems reviewed and are negative.    Physical Exam Updated Vital Signs BP 121/79 (BP Location: Right Arm)   Pulse 84   Temp 98.9 F (37.2 C) (Oral)   Resp 16   SpO2 96%   Physical Exam Vitals signs and nursing note reviewed.  Constitutional:      General: She is not in acute distress.    Appearance: She is well-developed.     Comments: Pt appears uncomfortable due to pain, in NAD  HENT:     Head: Normocephalic and atraumatic.  Eyes:     Conjunctiva/sclera: Conjunctivae normal.     Pupils: Pupils are equal, round, and reactive to light.  Neck:     Musculoskeletal: Normal range of motion and neck supple.  Cardiovascular:     Rate and Rhythm: Normal rate and regular rhythm.  Pulmonary:     Effort: Pulmonary effort is normal. No respiratory distress.     Breath sounds: Normal breath sounds. No wheezing.  Abdominal:     General: Bowel sounds are normal. There is no distension.     Palpations: Abdomen is soft.     Tenderness: There is abdominal tenderness in the right lower quadrant. There is guarding. Positive signs include McBurney's sign and psoas sign.  Musculoskeletal: Normal range of motion.  Skin:    General: Skin is warm and dry.  Neurological:     Mental Status: She is alert and oriented to person, place, and time.      ED Treatments / Results  Labs (all labs ordered are listed, but only abnormal results are displayed) Labs Reviewed  CBC WITH DIFFERENTIAL/PLATELET     EKG None  Radiology Ct Abdomen Pelvis W Contrast  Result Date: 09/14/2018 CLINICAL DATA:  Abdominal pain, primarily right lower quadrant EXAM: CT ABDOMEN AND PELVIS WITH CONTRAST TECHNIQUE: Multidetector CT imaging of the abdomen and pelvis was performed using the standard protocol following bolus administration of intravenous contrast. Oral contrast was also administered. CONTRAST:  110m ISOVUE-300 IOPAMIDOL (ISOVUE-300) INJECTION 61% COMPARISON:  May 08, 2012 FINDINGS: Lower chest: Lung bases are clear. Hepatobiliary: No focal liver lesions are evident. Gallbladder is absent. There is no appreciable biliary duct dilatation. Pancreas: There is no pancreatic mass or inflammatory focus. Spleen: There is a probable cyst in the anterior spleen measuring 7 mm. No other splenic lesions evident. Adrenals/Urinary Tract: Adrenals bilaterally appear normal. Kidneys bilaterally show no evident mass or hydronephrosis on either side. There is no appreciable renal or ureteral calculus on either side. Urinary bladder is midline with wall thickness within normal limits. Stomach/Bowel: There is no appreciable bowel wall or mesenteric thickening. There is no evident bowel obstruction. There is no free air or portal venous air. Vascular/Lymphatic: There is no abdominal aortic aneurysm. There is slight atherosclerotic calcification in the aorta. Major mesenteric arterial vessels appear patent. There is no adenopathy in the abdomen or pelvis. Reproductive: The uterus is absent.  There is no pelvic mass. Other: The appendix is distended, measuring 1.2 cm in diameter. There is enhancement of the wall of the appendix. There is no apicolateral. There is appreciable fluid. There is slight mesenteric stranding in the periappendiceal region. No abscess  or perforation evident. No evident appendicolith. No abscess or ascites is evident in the abdomen or pelvis. Musculoskeletal: No blastic or lytic bone lesions. No  intramuscular or abdominal wall lesion evident. IMPRESSION: 1.  Findings indicative of acute appendicitis. Appendix: Location: Lower right pelvis arising inferiorly from the cecum. Diameter: 12 mm Appendicolith: None Mucosal hyper-enhancement: Present Extraluminal gas: None Periappendiceal collection: None. There is slight periappendiceal mesenteric thickening. 2. No evident bowel obstruction. No abscess in the abdomen or pelvis. 3.  No evident renal or ureteral calculus.  No hydronephrosis. Critical Value/emergent results were called by telephone at the time of interpretation on 09/14/2018 at 1:52 pm to [facility], NP, who verbally acknowledged these results. Electronically Signed   By: Lowella Grip III M.D.   On: 09/14/2018 13:52    Procedures Procedures (including critical care time)  Medications Ordered in ED Medications - No data to display   Initial Impression / Assessment and Plan / ED Course  I have reviewed the triage vital signs and the nursing notes.  Pertinent labs & imaging results that were available during my care of the patient were reviewed by me and considered in my medical decision making (see chart for details).     Patient presenting for evaluation of abdominal pain, nausea, diarrhea.  Physical exam reassuring, patient is afebrile not tachycardic.  Appears nontoxic.  Patient does have tenderness palpation of right lower quadrant with voluntary guarding.  Patient had outpatient CT which showed acute appendicitis.  No signs for sepsis at this time.  BMP from doctors visit reviewed.  Will order CBC, pain control, antibiotics, and consult with general surgery.  Discussed with general surgery, CCS to admit.   Final Clinical Impressions(s) / ED Diagnoses   Final diagnoses:  Other acute appendicitis    ED Discharge Orders    None       Franchot Heidelberg, PA-C 09/14/18 1549    Isla Pence, MD 09/14/18 1627

## 2018-09-14 NOTE — H&P (Signed)
[facility] Admission Note  Yolanda [name] [birth year]  [id number].    Requesting MD: Ethelene Browns Chief Complaint/Reason for Consult: Appendicitis  HPI:  Patient is a 58 year old female who is otherwise generally healthy who presented to [facility] from PCP office for evaluation of abdominal pain. Patient had an outpatient CT scan today that was suggestive of acute appendicitis. Patient reports abdominal pain started yesterday morning in periumbilical region and migrated to RLQ. Pain is sharp and has progressively worsened. Pain worsened by standing or walking, somewhat relieved with heat. Some associated nausea and diarrhea. Denies fever, chills, chest pain, SOB, vomiting, bloody stools, urinary symptoms. Past abdominal surgeries include laparoscopic cholecystectomy and laparoscopic bilateral salpingoophrectomy. No blood thinning medications. Patient reports a few glasses of wine per week, no tobacco or illicit drug use. Not currently working.   ROS: Review of Systems  Constitutional: Negative for chills and fever.  Respiratory: Negative for shortness of breath.   Cardiovascular: Negative for chest pain and palpitations.  Gastrointestinal: Positive for abdominal pain, diarrhea and nausea. Negative for blood in stool, constipation and vomiting.  Genitourinary: Negative for dysuria, frequency and urgency.  All other systems reviewed and are negative.   Family History  Problem Relation Age of Onset  . Uterine cancer Maternal Grandmother        dx in her 13s  . Breast cancer Sister        paternal half sister dx in her early 72s  . Cystic fibrosis Other        brother's granddauther  . Emphysema Father   . Early death Father   . Breast cancer Sister        paternal half sister dx in her late 24s  . Cystic fibrosis Other        brothers granddaughters  . Depression Mother   . Hyperlipidemia Mother   . Hypertension Mother   . Mental illness Mother   . Miscarriages /  [place] Mother   . Diabetes Brother 50       HALF BROTHER/HALF BROTHER   . Breast cancer Sister   . GI problems Brother   . Colon cancer Neg Hx   . Esophageal cancer Neg Hx   . Rectal cancer Neg Hx   . Stomach cancer Neg Hx   . Allergic rhinitis Neg Hx   . Asthma Neg Hx   . Eczema Neg Hx   . Urticaria Neg Hx     Past Medical History:  Diagnosis Date  . Allergy   . BRCA negative 12/2014  . Depression   . Esophagitis   . GERD (gastroesophageal reflux disease)   . Hiatal hernia   . History of diverticulitis   . IC (interstitial cystitis)   . Osteoporosis 10/2016   T score -2.6  . Schatzki's ring     Past Surgical History:  Procedure Laterality Date  . CHOLECYSTECTOMY    . HYSTEROSCOPY  12/2003   RESECTON OF SUBMUCOUS  MYOMA/DX SCOPE  . LAPAROSCOPIC BILATERAL SALPINGO OOPHERECTOMY Bilateral 07/02/2014   Procedure: LAPAROSCOPIC BILATERAL SALPINGO OOPHORECTOMY;  Surgeon: Anastasio Auerbach, MD;  Location: [facility];  Service: Gynecology;  Laterality: Bilateral;  . PELVIC LAPAROSCOPY  12/2003   DX SCOPE/RESECTION OF SUBMUCOUS MYOMA  . TMJ ARTHROSCOPY  11-26-2001  . VAGINAL HYSTERECTOMY  12/05   Menorrhagia/dysmenorrhea. Pathology showed leiomyoma/adenomyosis    Social History:  reports that she has never smoked. She has never used smokeless tobacco. She reports current alcohol use of about 2.0  standard drinks of alcohol per week. She reports that she does not use drugs.  Allergies:  Allergies  Allergen Reactions  . Honey Bee Treatment [Bee Venom] Anaphylaxis  . Eggs Or Egg-Derived Products     Stomach ache  . Wheat Bran     Intolerance     (Not in a hospital admission)   Blood pressure 121/79, pulse 84, temperature 98.9 F (37.2 C), temperature source Oral, resp. rate 16, SpO2 96 %. Physical Exam: Physical Exam Constitutional:      General: She is not in acute distress.    Appearance: She is well-developed and normal weight. She is not toxic-appearing.   HENT:     Head: Normocephalic and atraumatic.     Mouth/Throat:     Mouth: Mucous membranes are moist.     Pharynx: Oropharynx is clear.  Eyes:     General: No scleral icterus.    Extraocular Movements: Extraocular movements intact.     Pupils: Pupils are equal, round, and reactive to light.  Cardiovascular:     Rate and Rhythm: Normal rate and regular rhythm.     Heart sounds: Normal heart sounds.  Pulmonary:     Effort: Pulmonary effort is normal.     Breath sounds: Normal breath sounds.  Abdominal:     General: Bowel sounds are normal. There is no distension.     Palpations: Abdomen is soft. There is no hepatomegaly or splenomegaly.     Tenderness: There is abdominal tenderness in the right lower quadrant. There is no guarding or rebound. Positive signs include McBurney's sign. Negative signs include Rovsing's sign.     Hernia: No hernia is present.  Skin:    General: Skin is warm and dry.  Neurological:     General: No focal deficit present.     Mental Status: She is alert and oriented to person, place, and time.  Psychiatric:        Mood and Affect: Mood normal.        Behavior: Behavior normal.     Results for orders placed or performed during the hospital encounter of 09/14/18 (from the past 48 hour(s))  CBC with Differential     Status: None   Collection Time: 09/14/18  3:03 PM  Result Value Ref Range   WBC 8.0 4.0 - 10.5 K/uL   RBC 4.88 3.87 - 5.11 MIL/uL   Hemoglobin 13.3 12.0 - 15.0 g/dL   HCT 41.9 36.0 - 46.0 %   MCV 85.9 80.0 - 100.0 fL   MCH 27.3 26.0 - 34.0 pg   MCHC 31.7 30.0 - 36.0 g/dL   RDW 13.4 11.5 - 15.5 %   Platelets 219 150 - 400 K/uL   nRBC 0.0 0.0 - 0.2 %   Neutrophils Relative % 71 %   Neutro Abs 5.7 1.7 - 7.7 K/uL   Lymphocytes Relative 18 %   Lymphs Abs 1.4 0.7 - 4.0 K/uL   Monocytes Relative 7 %   Monocytes Absolute 0.6 0.1 - 1.0 K/uL   Eosinophils Relative 3 %   Eosinophils Absolute 0.2 0.0 - 0.5 K/uL   Basophils Relative 1 %    Basophils Absolute 0.0 0.0 - 0.1 K/uL   Immature Granulocytes 0 %   Abs Immature Granulocytes 0.03 0.00 - 0.07 K/uL    Comment: Performed at [facility], [address] [place], [place] [address]   Ct Abdomen Pelvis W Contrast  Result Date: 09/14/2018 CLINICAL DATA:  Abdominal pain, primarily right  lower quadrant EXAM: CT ABDOMEN AND PELVIS WITH CONTRAST TECHNIQUE: Multidetector CT imaging of the abdomen and pelvis was performed using the standard protocol following bolus administration of intravenous contrast. Oral contrast was also administered. CONTRAST:  180m ISOVUE-300 IOPAMIDOL (ISOVUE-300) INJECTION 61% COMPARISON:  May 08, 2012 FINDINGS: Lower chest: Lung bases are clear. Hepatobiliary: No focal liver lesions are evident. Gallbladder is absent. There is no appreciable biliary duct dilatation. Pancreas: There is no pancreatic mass or inflammatory focus. Spleen: There is a probable cyst in the anterior spleen measuring 7 mm. No other splenic lesions evident. Adrenals/Urinary Tract: Adrenals bilaterally appear normal. Kidneys bilaterally show no evident mass or hydronephrosis on either side. There is no appreciable renal or ureteral calculus on either side. Urinary bladder is midline with wall thickness within normal limits. Stomach/Bowel: There is no appreciable bowel wall or mesenteric thickening. There is no evident bowel obstruction. There is no free air or portal venous air. Vascular/Lymphatic: There is no abdominal aortic aneurysm. There is slight atherosclerotic calcification in the aorta. Major mesenteric arterial vessels appear patent. There is no adenopathy in the abdomen or pelvis. Reproductive: The uterus is absent.  There is no pelvic mass. Other: The appendix is distended, measuring 1.2 cm in diameter. There is enhancement of the wall of the appendix. There is no apicolateral. There is appreciable fluid. There is slight mesenteric stranding in the periappendiceal region. No  abscess or perforation evident. No evident appendicolith. No abscess or ascites is evident in the abdomen or pelvis. Musculoskeletal: No blastic or lytic bone lesions. No intramuscular or abdominal wall lesion evident. IMPRESSION: 1.  Findings indicative of acute appendicitis. Appendix: Location: Lower right pelvis arising inferiorly from the cecum. Diameter: 12 mm Appendicolith: None Mucosal hyper-enhancement: Present Extraluminal gas: None Periappendiceal collection: None. There is slight periappendiceal mesenteric thickening. 2. No evident bowel obstruction. No abscess in the abdomen or pelvis. 3.  No evident renal or ureteral calculus.  No hydronephrosis. Critical Value/emergent results were called by telephone at the time of interpretation on 09/14/2018 at 1:52 pm to [facility] NP, who verbally acknowledged these results. Electronically Signed   By: WLowella GripIII M.D.   On: 09/14/2018 13:52      Assessment/Plan Acute appendicitis - CT shows dilated appendix with mild inflammation surrounding, no appendicolith - WBC 8, afeb - discussed medical vs operative management - patient agrees to proceed with operative management - to OR for lap appy   [facility]igid [facility] 09/14/2018, 3:53 PM Pager: [phone] Consults: [phone]-Fri 7:00 am-4:30 pm Sat-Sun 7:00 am-11:30 am

## 2018-09-15 ENCOUNTER — Other Ambulatory Visit: Payer: Self-pay

## 2018-09-15 LAB — CBC
HCT: 34.5 % — ABNORMAL LOW (ref 36.0–46.0)
Hemoglobin: 11.4 g/dL — ABNORMAL LOW (ref 12.0–15.0)
MCH: 27.9 pg (ref 26.0–34.0)
MCHC: 33 g/dL (ref 30.0–36.0)
MCV: 84.6 fL (ref 80.0–100.0)
PLATELETS: 184 10*3/uL (ref 150–400)
RBC: 4.08 MIL/uL (ref 3.87–5.11)
RDW: 13.5 % (ref 11.5–15.5)
WBC: 6 10*3/uL (ref 4.0–10.5)
nRBC: 0 % (ref 0.0–0.2)

## 2018-09-15 MED ORDER — BUPROPION HBR ER 174 MG PO TB24: 1.0000 | ORAL_TABLET | Freq: Every day | ORAL | Status: DC

## 2018-09-15 NOTE — Discharge Summary (Signed)
Physician Discharge Summary  Yolanda [name] OEH:[id number] DOB: [birth year] DOA: 09/14/2018  PCP: Lucille Passy, MD  Admit date: 09/14/2018 Discharge date: 09/15/2018  Recommendations for Outpatient Follow-up:    Follow-up Information    [facility], [place] Follow up in 3 week(s).   Specialty:  General Surgery Why:  our office will call you with appointment date and time.  please arrive 30 minutes prior to your appointment for paperwork and check in process.  please bring photo ID and insurance card. Contact information: [place] [address] [phone]          Discharge Diagnoses:  1. Acute appendicitis 2. depression  Surgical Procedure: lap appendectomy 09/14/2018; dr white  Discharge Condition: good Disposition: home  Diet recommendation: regular  Filed Weights   09/14/18 2155  Weight: 64 kg    History of present illness:  Ms. Yolanda [name] is a very pleasant 44yoF with hx of GERD, diverticulitis, depression, interstitial cystitis whom presented to the emergency room from the office of her PCP for evaluation of abdominal pain after having had a CT scan completed.  She reports a 1 day history of abdominal discomfort that began in the midepigastrium and right lateral abdomen and subsequently migrated down to the right lower quadrant.  She reports the pain is being sharp and steadily worsening over time.  This was made worse when she gets up and walks.  She is had some associated nausea as well as loose or watery stool.  She denies any fever/chills/emesis.  Hospital Course:  Taken to OR by Dr Dema Severin for lap appy. Kept overnight for observation. On POD 1 doing well. No n/v. Pain controlled. Had a little bit mild hypoTN overnight but normotensive this am. Expected drop in hgb with fluids and surgery. No tachycardia. No lightheadedness or dizziness.  Tolerating liquids. Reports her doctor gave her some tramadol prior to coming to hospital and declines addl  rx  Discussed dc instructions.   BP (!) 103/59 (BP Location: Left Arm)   Pulse (!) 57   Temp 98.3 F (36.8 C) (Oral)   Resp 14   Ht 5\' 1"  (1.549 m)   Wt 64 kg   SpO2 97%   BMI 26.66 kg/m   Gen: alert, NAD, non-toxic appearing Pupils: equal, no scleral icterus Pulm: Lungs clear to auscultation, symmetric chest rise CV: regular rate and rhythm Abd: soft,  Min tender, nondistended.  No cellulitis. No incisional hernia Ext: no edema, no calf tenderness Skin: no rash, no jaundice    Discharge Instructions  Discharge Instructions    Call MD for:   Complete by:  As directed    Temperature >101   Call MD for:  hives   Complete by:  As directed    Call MD for:  persistant dizziness or light-headedness   Complete by:  As directed    Call MD for:  persistant nausea and vomiting   Complete by:  As directed    Call MD for:  redness, tenderness, or signs of infection (pain, swelling, redness, odor or green/yellow discharge around incision site)   Complete by:  As directed    Call MD for:  severe uncontrolled pain   Complete by:  As directed    Diet general   Complete by:  As directed    Discharge instructions   Complete by:  As directed    See [facility] discharge instructions   Increase activity slowly   Complete by:  As directed  Allergies as of 09/15/2018      Reactions   Honey Bee Treatment [bee Venom] Anaphylaxis   Eggs Or Egg-derived Products    Stomach ache   Peanut-containing Drug Products Other (See Comments)   Allergy testing   Sesame Seed (diagnostic) Other (See Comments)   Allergy test   Soy Allergy Other (See Comments)   Allergy test   Wheat Bran    Intolerance       Medication List    STOP taking these medications   olopatadine 0.1 % ophthalmic solution Commonly known as:  PATANOL   ondansetron 4 MG tablet Commonly known as:  ZOFRAN     TAKE these medications   albuterol 108 (90 Base) MCG/ACT inhaler Commonly known as:  PROVENTIL HFA;VENTOLIN  HFA Inhale 2 puffs into the lungs every 6 (six) hours as needed for wheezing or shortness of breath.   APLENZIN 174 MG Tb24 Generic drug:  BuPROPion HBr Take 1 tablet by mouth daily.   CALCIUM 600 PO Take 1 tablet by mouth daily.   EPINEPHrine 0.3 mg/0.3 mL Soaj injection Commonly known as:  EPI-PEN Inject 0.3 mg into the muscle as needed for anaphylaxis.   LORazepam 0.5 MG tablet Commonly known as:  ATIVAN Take 0.5 mg by mouth daily as needed for sleep.   MAGNESIUM GLYCINATE PLUS PO Take 2 capsules by mouth at bedtime.   mometasone 50 MCG/ACT nasal spray Commonly known as:  NASONEX Place 2 sprays into the nose daily.   montelukast 10 MG tablet Commonly known as:  SINGULAIR TAKE 1 TABLET(10 MG) BY MOUTH AT BEDTIME What changed:  See the new instructions.   multivitamin tablet Take 1 tablet by mouth 2 (two) times daily.   OMEGAPURE 780 EC PO Take 1 tablet by mouth daily.   risedronate 150 MG tablet Commonly known as:  ACTONEL TAKE ONE TABLET(150MG ) BY MOUTH EVERY 30 DAYS WITH WATER ON EMPTY STOMAACH NOTHING BY MOUTH AND DON'T LIE DOWN FOR 30 MINUTES What changed:    how much to take  how to take this  when to take this  additional instructions   traMADol 50 MG tablet Commonly known as:  ULTRAM Take 1 tablet (50 mg total) by mouth every 12 (twelve) hours as needed for up to 3 days.      Follow-up Information    [facility], [place] Follow up in 3 week(s).   Specialty:  General Surgery Why:  our office will call you with appointment date and time.  please arrive 30 minutes prior to your appointment for paperwork and check in process.  please bring photo ID and insurance card. Contact information: [place] [address] [phone]            The results of significant diagnostics from this hospitalization (including imaging, microbiology, ancillary and laboratory) are listed below for reference.    Significant  Diagnostic Studies: Ct Abdomen Pelvis W Contrast  Result Date: 09/14/2018 CLINICAL DATA:  Abdominal pain, primarily right lower quadrant EXAM: CT ABDOMEN AND PELVIS WITH CONTRAST TECHNIQUE: Multidetector CT imaging of the abdomen and pelvis was performed using the standard protocol following bolus administration of intravenous contrast. Oral contrast was also administered. CONTRAST:  161mL ISOVUE-300 IOPAMIDOL (ISOVUE-300) INJECTION 61% COMPARISON:  May 08, 2012 FINDINGS: Lower chest: Lung bases are clear. Hepatobiliary: No focal liver lesions are evident. Gallbladder is absent. There is no appreciable biliary duct dilatation. Pancreas: There is no pancreatic mass or inflammatory focus. Spleen: There is  a probable cyst in the anterior spleen measuring 7 mm. No other splenic lesions evident. Adrenals/Urinary Tract: Adrenals bilaterally appear normal. Kidneys bilaterally show no evident mass or hydronephrosis on either side. There is no appreciable renal or ureteral calculus on either side. Urinary bladder is midline with wall thickness within normal limits. Stomach/Bowel: There is no appreciable bowel wall or mesenteric thickening. There is no evident bowel obstruction. There is no free air or portal venous air. Vascular/Lymphatic: There is no abdominal aortic aneurysm. There is slight atherosclerotic calcification in the aorta. Major mesenteric arterial vessels appear patent. There is no adenopathy in the abdomen or pelvis. Reproductive: The uterus is absent.  There is no pelvic mass. Other: The appendix is distended, measuring 1.2 cm in diameter. There is enhancement of the wall of the appendix. There is no apicolateral. There is appreciable fluid. There is slight mesenteric stranding in the periappendiceal region. No abscess or perforation evident. No evident appendicolith. No abscess or ascites is evident in the abdomen or pelvis. Musculoskeletal: No blastic or lytic bone lesions. No intramuscular or  abdominal wall lesion evident. IMPRESSION: 1.  Findings indicative of acute appendicitis. Appendix: Location: Lower right pelvis arising inferiorly from the cecum. Diameter: 12 mm Appendicolith: None Mucosal hyper-enhancement: Present Extraluminal gas: None Periappendiceal collection: None. There is slight periappendiceal mesenteric thickening. 2. No evident bowel obstruction. No abscess in the abdomen or pelvis. 3.  No evident renal or ureteral calculus.  No hydronephrosis. Critical Value/emergent results were called by telephone at the time of interpretation on 09/14/2018 at 1:52 pm to [facility] NP, who verbally acknowledged these results. Electronically Signed   By: Lowella Grip III M.D.   On: 09/14/2018 13:52    Microbiology: No results found for this or any previous visit (from the past 240 hour(s)).   Labs: Basic Metabolic Panel: Recent Labs  Lab 09/14/18 1036  NA 140  K 4.1  CL 101  CO2 30  GLUCOSE 86  BUN 11  CREATININE 0.80  CALCIUM 10.4   Liver Function Tests: No results for input(s): AST, ALT, ALKPHOS, BILITOT, PROT, ALBUMIN in the last 168 hours. No results for input(s): LIPASE, AMYLASE in the last 168 hours. No results for input(s): AMMONIA in the last 168 hours. CBC: Recent Labs  Lab 09/14/18 1503 09/15/18 0837  WBC 8.0 6.0  NEUTROABS 5.7  --   HGB 13.3 11.4*  HCT 41.9 34.5*  MCV 85.9 84.6  PLT 219 184   Cardiac Enzymes: No results for input(s): CKTOTAL, CKMB, CKMBINDEX, TROPONINI in the last 168 hours. BNP: BNP (last 3 results) No results for input(s): BNP in the last 8760 hours.  ProBNP (last 3 results) No results for input(s): PROBNP in the last 8760 hours.  CBG: No results for input(s): GLUCAP in the last 168 hours.  Active Problems:   Acute appendicitis   Time coordinating discharge: 20 min  Signed:  Gayland Curry, MD [facility], [place] [phone] 09/15/2018, 9:36 AM

## 2018-09-15 NOTE — Discharge Instructions (Signed)
[facility]  Please arrive at least 30 min before your appointment to complete your check in paperwork.  If you are unable to arrive 30 min prior to your appointment time we may have to cancel or reschedule you. LAPAROSCOPIC SURGERY: POST OP INSTRUCTIONS Always review your discharge instruction sheet given to you by the facility where your surgery was performed. IF YOU HAVE DISABILITY OR FAMILY LEAVE FORMS, YOU MUST BRING THEM TO THE OFFICE FOR PROCESSING.   DO NOT GIVE THEM TO YOUR DOCTOR.  PAIN CONTROL  1. First take acetaminophen (Tylenol) AND/or ibuprofen (Advil) to control your pain after surgery.  Follow directions on package.  Taking acetaminophen (Tylenol) and/or ibuprofen (Advil) regularly after surgery will help to control your pain and lower the amount of prescription pain medication you may need.  You should not take more than 4,000 mg (4 grams) of acetaminophen (Tylenol) in 24 hours.  You should not take ibuprofen (Advil), aleve, motrin, naprosyn or other NSAIDS if you have a history of stomach ulcers or chronic kidney disease.  2. A prescription for pain medication may be given to you upon discharge.  Take your pain medication as prescribed, if you still have uncontrolled pain after taking acetaminophen (Tylenol) or ibuprofen (Advil). 3. Use ice packs to help control pain. 4. If you need a refill on your pain medication, please contact your pharmacy.  They will contact our office to request authorization. Prescriptions will not be filled after 5pm or on week-ends.  HOME MEDICATIONS 5. Take your usually prescribed medications unless otherwise directed.  DIET 6. You should follow a light diet the first few days after arrival home.  Be sure to include lots of fluids daily. Avoid fatty, fried foods.   CONSTIPATION 7. It is common to experience some constipation after surgery and if you are taking pain medication.  Increasing fluid intake and taking a stool  softener (such as Colace) will usually help or prevent this problem from occurring.  A mild laxative (Milk of Magnesia or Miralax) should be taken according to package instructions if there are no bowel movements after 48 hours.  WOUND/INCISION CARE 8. Most patients will experience some swelling and bruising in the area of the incisions.  Ice packs will help.  Swelling and bruising can take several days to resolve.  9. Unless discharge instructions indicate otherwise, follow guidelines below  a. STERI-STRIPS - you may remove your outer bandages 48 hours after surgery, and you may shower at that time.  You have steri-strips (small skin tapes) in place directly over the incision.  These strips should be left on the skin for 7-10 days.   b. DERMABOND/SKIN GLUE - you may shower in 24 hours.  The glue will flake off over the next 2-3 weeks. 10. Any sutures or staples will be removed at the office during your follow-up visit.  ACTIVITIES 11. You may resume regular (light) daily activities beginning the next day--such as daily self-care, walking, climbing stairs--gradually increasing activities as tolerated.  You may have sexual intercourse when it is comfortable.  Refrain from any heavy lifting or straining until approved by your doctor. a. You may drive when you are no longer taking prescription pain medication, you can comfortably wear a seatbelt, and you can safely maneuver your car and apply brakes.  FOLLOW-UP 12. You should see your doctor in the office for a follow-up appointment approximately 2-3 weeks after your surgery.  You should have been given your post-op/follow-up appointment when   your surgery was scheduled.  If you did not receive a post-op/follow-up appointment, make sure that you call for this appointment within a day or two after you arrive home to insure a convenient appointment time.   WHEN TO CALL YOUR DOCTOR: 1. Fever over 101.0 2. Inability to urinate 3. Continued bleeding from  incision. 4. Increased pain, redness, or drainage from the incision. 5. Increasing abdominal pain  The clinic staff is available to answer your questions during regular business hours.  Please don't hesitate to call and ask to speak to one of the nurses for clinical concerns.  If you have a medical emergency, go to the nearest emergency room or call 911.  A surgeon from [facility] is always on call at the hospital. [address], [address], [place], [place]  [address] ? [address] [address], [place], [place]   [address] [phone] ? [phone] ? FAX [phone]  .........   Managing Your Pain After Surgery Without Opioids    Thank you for participating in our program to help patients manage their pain after surgery without opioids. This is part of our effort to provide you with the best care possible, without exposing you or your family to the risk that opioids pose.  What pain can I expect after surgery? You can expect to have some pain after surgery. This is normal. The pain is typically worse the day after surgery, and quickly begins to get better. Many studies have found that many patients are able to manage their pain after surgery with Over-the-Counter (OTC) medications such as Tylenol and Motrin. If you have a condition that does not allow you to take Tylenol or Motrin, notify your surgical team.  How will I manage my pain? The best strategy for controlling your pain after surgery is around the clock pain control with Tylenol (acetaminophen) and Motrin (ibuprofen or Advil). Alternating these medications with each other allows you to maximize your pain control. In addition to Tylenol and Motrin, you can use heating pads or ice packs on your incisions to help reduce your pain.  How will I alternate your regular strength over-the-counter pain medication? You will take a dose of pain medication every three hours. ; Start by taking 650 mg of Tylenol (2 pills of 325  mg) ; 3 hours later take 600 mg of Motrin (3 pills of 200 mg) ; 3 hours after taking the Motrin take 650 mg of Tylenol ; 3 hours after that take 600 mg of Motrin.   - 1 -  See example - if your first dose of Tylenol is at 12:00 PM   12:00 PM Tylenol 650 mg (2 pills of 325 mg)  3:00 PM Motrin 600 mg (3 pills of 200 mg)  6:00 PM Tylenol 650 mg (2 pills of 325 mg)  9:00 PM Motrin 600 mg (3 pills of 200 mg)  Continue alternating every 3 hours   We recommend that you follow this schedule around-the-clock for at least 3 days after surgery, or until you feel that it is no longer needed. Use the table on the last page of this handout to keep track of the medications you are taking. Important: Do not take more than 3000mg of Tylenol or 3200mg of Motrin in a 24-hour period. Do not take ibuprofen/Motrin if you have a history of bleeding stomach ulcers, severe kidney disease, &/or actively taking a blood thinner  What if I still have pain? If you have pain that is not   controlled with the over-the-counter pain medications (Tylenol and Motrin or Advil) you might have what we call "breakthrough" pain. You will receive a prescription for a small amount of an opioid pain medication such as Oxycodone, Tramadol, or Tylenol with Codeine. Use these opioid pills in the first 24 hours after surgery if you have breakthrough pain. Do not take more than 1 pill every 4-6 hours.  If you still have uncontrolled pain after using all opioid pills, don't hesitate to call our staff using the number provided. We will help make sure you are managing your pain in the best way possible, and if necessary, we can provide a prescription for additional pain medication.   Day 1    Time  Name of Medication Number of pills taken  Amount of Acetaminophen  Pain Level   Comments  AM PM       AM PM       AM PM       AM PM       AM PM       AM PM       AM PM       AM PM       Total Daily amount of Acetaminophen Do not  take more than  3,000 mg per day      Day 2    Time  Name of Medication Number of pills taken  Amount of Acetaminophen  Pain Level   Comments  AM PM       AM PM       AM PM       AM PM       AM PM       AM PM       AM PM       AM PM       Total Daily amount of Acetaminophen Do not take more than  3,000 mg per day      Day 3    Time  Name of Medication Number of pills taken  Amount of Acetaminophen  Pain Level   Comments  AM PM       AM PM       AM PM       AM PM          AM PM       AM PM       AM PM       AM PM       Total Daily amount of Acetaminophen Do not take more than  3,000 mg per day      Day 4    Time  Name of Medication Number of pills taken  Amount of Acetaminophen  Pain Level   Comments  AM PM       AM PM       AM PM       AM PM       AM PM       AM PM       AM PM       AM PM       Total Daily amount of Acetaminophen Do not take more than  3,000 mg per day      Day 5    Time  Name of Medication Number of pills taken  Amount of Acetaminophen  Pain Level   Comments  AM PM       AM PM       AM   PM       AM PM       AM PM       AM PM       AM PM       AM PM       Total Daily amount of Acetaminophen Do not take more than  3,000 mg per day       Day 6    Time  Name of Medication Number of pills taken  Amount of Acetaminophen  Pain Level  Comments  AM PM       AM PM       AM PM       AM PM       AM PM       AM PM       AM PM       AM PM       Total Daily amount of Acetaminophen Do not take more than  3,000 mg per day      Day 7    Time  Name of Medication Number of pills taken  Amount of Acetaminophen  Pain Level   Comments  AM PM       AM PM       AM PM       AM PM       AM PM       AM PM       AM PM       AM PM       Total Daily amount of Acetaminophen Do not take more than  3,000 mg per day        For additional information about how and where to safely dispose of unused  opioid medications - https://www.morepowerfulnc.org  Disclaimer: This document contains information and/or instructional materials adapted from [facility] for the typical patient with your condition. It does not replace medical advice from your health care provider because your experience may differ from that of the typical patient. Talk to your health care provider if you have any questions about this document, your condition or your treatment plan. Adapted from [facility]   

## 2018-09-15 NOTE — Progress Notes (Signed)
Patient discharged to home with instructions. 

## 2018-09-15 NOTE — Progress Notes (Signed)
Patient continues to c/o surgical pain 6/10 despite po pain meds, paged on all surgeon with new order.

## 2018-09-15 NOTE — Anesthesia Postprocedure Evaluation (Signed)
Anesthesia Post Note  Patient: Yolanda [name]  Procedure(s) Performed: APPENDECTOMY LAPAROSCOPIC (N/A )     Patient location during evaluation: PACU Anesthesia Type: General Level of consciousness: awake and alert Pain management: pain level controlled Vital Signs Assessment: post-procedure vital signs reviewed and stable Respiratory status: spontaneous breathing, nonlabored ventilation and respiratory function stable Cardiovascular status: blood pressure returned to baseline and stable Postop Assessment: no apparent nausea or vomiting Anesthetic complications: no    Last Vitals:  Vitals:   09/15/18 0445 09/15/18 0803  BP: 94/65 (!) 103/59  Pulse: (!) 57 (!) 57  Resp: 14 14  Temp: 36.8 C   SpO2: 95% 97%    Last Pain:  Vitals:   09/15/18 0542  TempSrc:   PainSc: 0-No pain                 Catalina Gravel

## 2018-09-16 ENCOUNTER — Encounter (HOSPITAL_COMMUNITY): Payer: Self-pay | Admitting: Surgery

## 2018-09-18 ENCOUNTER — Telehealth: Payer: Self-pay

## 2018-09-18 NOTE — Telephone Encounter (Signed)
Copied from [place] [phone]. Topic: General - Inquiry >> Sep 18, 2018  2:08 PM Vernona Rieger wrote: Reason for CRM: Patient would like for Dr Hulen Shouts nurse to call her about accepting her daughter as a new patient. She was advised that Dr Deborra Medina is not accepting new patients at this time but insisted that I send a message anyway. She would like to be called back [phone]

## 2018-09-20 ENCOUNTER — Ambulatory Visit (INDEPENDENT_AMBULATORY_CARE_PROVIDER_SITE_OTHER): Payer: Managed Care, Other (non HMO) | Admitting: *Deleted

## 2018-09-20 DIAGNOSIS — J309 Allergic rhinitis, unspecified: Secondary | ICD-10-CM | POA: Diagnosis not present

## 2018-09-20 NOTE — Telephone Encounter (Signed)
TB-TA agreed to take this woman's daughter on as a new patient/Uncertain about scheduling new patients/thx dmf

## 2018-09-20 NOTE — Telephone Encounter (Signed)
Okay to take her on as a patient.

## 2018-09-20 NOTE — Telephone Encounter (Signed)
TA-Did you talk to this woman about her daughter becoming a patient of yours? Plz advise/thx dmf

## 2018-09-25 ENCOUNTER — Ambulatory Visit (INDEPENDENT_AMBULATORY_CARE_PROVIDER_SITE_OTHER): Payer: Managed Care, Other (non HMO) | Admitting: *Deleted

## 2018-09-25 DIAGNOSIS — J309 Allergic rhinitis, unspecified: Secondary | ICD-10-CM

## 2018-09-30 ENCOUNTER — Other Ambulatory Visit: Payer: Self-pay | Admitting: Gynecology

## 2018-10-02 ENCOUNTER — Ambulatory Visit (INDEPENDENT_AMBULATORY_CARE_PROVIDER_SITE_OTHER): Payer: Managed Care, Other (non HMO) | Admitting: *Deleted

## 2018-10-02 DIAGNOSIS — J309 Allergic rhinitis, unspecified: Secondary | ICD-10-CM

## 2018-10-05 ENCOUNTER — Ambulatory Visit (INDEPENDENT_AMBULATORY_CARE_PROVIDER_SITE_OTHER): Payer: Managed Care, Other (non HMO) | Admitting: *Deleted

## 2018-10-05 DIAGNOSIS — J309 Allergic rhinitis, unspecified: Secondary | ICD-10-CM | POA: Diagnosis not present

## 2018-10-09 ENCOUNTER — Ambulatory Visit (INDEPENDENT_AMBULATORY_CARE_PROVIDER_SITE_OTHER): Payer: Managed Care, Other (non HMO) | Admitting: *Deleted

## 2018-10-09 DIAGNOSIS — J309 Allergic rhinitis, unspecified: Secondary | ICD-10-CM

## 2018-10-16 ENCOUNTER — Ambulatory Visit (INDEPENDENT_AMBULATORY_CARE_PROVIDER_SITE_OTHER): Payer: Managed Care, Other (non HMO) | Admitting: *Deleted

## 2018-10-16 DIAGNOSIS — J309 Allergic rhinitis, unspecified: Secondary | ICD-10-CM | POA: Diagnosis not present

## 2018-10-19 ENCOUNTER — Ambulatory Visit (INDEPENDENT_AMBULATORY_CARE_PROVIDER_SITE_OTHER): Payer: Managed Care, Other (non HMO) | Admitting: *Deleted

## 2018-10-19 DIAGNOSIS — J309 Allergic rhinitis, unspecified: Secondary | ICD-10-CM | POA: Diagnosis not present

## 2018-10-22 ENCOUNTER — Ambulatory Visit (INDEPENDENT_AMBULATORY_CARE_PROVIDER_SITE_OTHER): Payer: Managed Care, Other (non HMO)

## 2018-10-22 DIAGNOSIS — J309 Allergic rhinitis, unspecified: Secondary | ICD-10-CM | POA: Diagnosis not present

## 2018-10-25 ENCOUNTER — Ambulatory Visit (INDEPENDENT_AMBULATORY_CARE_PROVIDER_SITE_OTHER): Payer: Managed Care, Other (non HMO) | Admitting: *Deleted

## 2018-10-25 DIAGNOSIS — J309 Allergic rhinitis, unspecified: Secondary | ICD-10-CM | POA: Diagnosis not present

## 2018-10-30 ENCOUNTER — Ambulatory Visit (INDEPENDENT_AMBULATORY_CARE_PROVIDER_SITE_OTHER): Payer: Managed Care, Other (non HMO) | Admitting: *Deleted

## 2018-10-30 DIAGNOSIS — J309 Allergic rhinitis, unspecified: Secondary | ICD-10-CM

## 2018-11-02 ENCOUNTER — Ambulatory Visit (INDEPENDENT_AMBULATORY_CARE_PROVIDER_SITE_OTHER): Payer: Managed Care, Other (non HMO)

## 2018-11-02 DIAGNOSIS — J309 Allergic rhinitis, unspecified: Secondary | ICD-10-CM | POA: Diagnosis not present

## 2018-11-06 ENCOUNTER — Ambulatory Visit (INDEPENDENT_AMBULATORY_CARE_PROVIDER_SITE_OTHER): Payer: Managed Care, Other (non HMO) | Admitting: *Deleted

## 2018-11-06 DIAGNOSIS — J309 Allergic rhinitis, unspecified: Secondary | ICD-10-CM

## 2018-11-09 ENCOUNTER — Ambulatory Visit (INDEPENDENT_AMBULATORY_CARE_PROVIDER_SITE_OTHER): Payer: Managed Care, Other (non HMO) | Admitting: *Deleted

## 2018-11-09 DIAGNOSIS — J309 Allergic rhinitis, unspecified: Secondary | ICD-10-CM | POA: Diagnosis not present

## 2018-11-12 ENCOUNTER — Ambulatory Visit (INDEPENDENT_AMBULATORY_CARE_PROVIDER_SITE_OTHER): Payer: Managed Care, Other (non HMO) | Admitting: *Deleted

## 2018-11-12 ENCOUNTER — Other Ambulatory Visit: Payer: Self-pay | Admitting: *Deleted

## 2018-11-12 DIAGNOSIS — J309 Allergic rhinitis, unspecified: Secondary | ICD-10-CM | POA: Diagnosis not present

## 2018-11-12 MED ORDER — OLOPATADINE HCL 0.1 % OP SOLN: 1.0000 [drp] | Freq: Two times a day (BID) | OPHTHALMIC | 5 refills | Status: DC

## 2018-11-15 ENCOUNTER — Ambulatory Visit (INDEPENDENT_AMBULATORY_CARE_PROVIDER_SITE_OTHER): Payer: Managed Care, Other (non HMO)

## 2018-11-15 DIAGNOSIS — J309 Allergic rhinitis, unspecified: Secondary | ICD-10-CM | POA: Diagnosis not present

## 2018-11-16 ENCOUNTER — Telehealth: Payer: Self-pay | Admitting: Family Medicine

## 2018-11-16 DIAGNOSIS — N644 Mastodynia: Secondary | ICD-10-CM

## 2018-11-16 NOTE — Telephone Encounter (Signed)
Are they currently doing mammograms?

## 2018-11-16 NOTE — Telephone Encounter (Signed)
Patient called and stated she had an appointment scheduled @ [facility] for her screening mammogram and was called by there office to cancel and reschedule appointment for in June. Patient stated that she now needs to have a diagnostic of the right breast due to extreme tenderness under breast. Patient is very anxious to have this done. If you agree to ordering diagnostic, please fax order to [facility], they are not part of [employer] and will not see the order in the system. Please call patient at [phone].

## 2018-11-20 ENCOUNTER — Ambulatory Visit (INDEPENDENT_AMBULATORY_CARE_PROVIDER_SITE_OTHER): Payer: Managed Care, Other (non HMO) | Admitting: *Deleted

## 2018-11-20 DIAGNOSIS — J309 Allergic rhinitis, unspecified: Secondary | ICD-10-CM | POA: Diagnosis not present

## 2018-11-20 NOTE — Addendum Note (Signed)
Addended by: Rodrigo Ran on: 11/20/2018 01:16 PM   Modules accepted: Orders

## 2018-11-20 NOTE — Telephone Encounter (Signed)
Yes okay to place mammogram order

## 2018-11-20 NOTE — Telephone Encounter (Signed)
Copied from [place] [phone]. Topic: Quick Communication - See Telephone Encounter >> Nov 20, 2018 10:29 AM Loma Boston wrote: CRM for notification. See Telephone encounter for: 11/20/18. PT had spoke with Dr. Hulen Shouts office about another mammo because she is experiencing tenderness. In the meantime [facility] has reached out to her and said that they have put off standard mammo's COVID but since she has got an active issue that if Dr Deborra Medina will send them a referral that they will go ahead and schedule her in at this time. They just need the referral to proceed.

## 2018-11-20 NOTE — Telephone Encounter (Signed)
Dr. Deborra Medina,  Okay to place order for Mammogram? They will do hers since she is having an active issue.

## 2018-11-20 NOTE — Telephone Encounter (Signed)
Order entered & faxed over to [facility]. Patient is aware.

## 2018-11-21 NOTE — Telephone Encounter (Signed)
Pt called in because she was advised by imaging to make provider aware that the order need to be updated to read bilateral to complete imaging for both breast.  Please update mammogram order for apt.   Thanks.

## 2018-11-21 NOTE — Telephone Encounter (Signed)
Order fixed & re-faxed.

## 2018-11-21 NOTE — Addendum Note (Signed)
Addended by: Nathanial Millman E on: 11/21/2018 11:47 AM   Modules accepted: Orders

## 2018-11-27 ENCOUNTER — Encounter: Payer: Self-pay | Admitting: Gynecology

## 2018-11-27 LAB — HM MAMMOGRAPHY

## 2018-11-28 ENCOUNTER — Encounter: Payer: Self-pay | Admitting: Family Medicine

## 2018-11-28 NOTE — Progress Notes (Signed)
[facility]/thx dmf 

## 2018-11-29 ENCOUNTER — Ambulatory Visit (INDEPENDENT_AMBULATORY_CARE_PROVIDER_SITE_OTHER): Payer: Managed Care, Other (non HMO)

## 2018-11-29 DIAGNOSIS — J309 Allergic rhinitis, unspecified: Secondary | ICD-10-CM | POA: Diagnosis not present

## 2018-12-03 ENCOUNTER — Encounter: Payer: Self-pay | Admitting: Gynecology

## 2018-12-04 DIAGNOSIS — S322XXA Fracture of coccyx, initial encounter for closed fracture: Secondary | ICD-10-CM

## 2018-12-04 DIAGNOSIS — M533 Sacrococcygeal disorders, not elsewhere classified: Secondary | ICD-10-CM | POA: Insufficient documentation

## 2018-12-04 HISTORY — DX: Fracture of coccyx, initial encounter for closed fracture: S32.2XXA

## 2018-12-04 HISTORY — DX: Sacrococcygeal disorders, not elsewhere classified: M53.3

## 2018-12-06 ENCOUNTER — Ambulatory Visit (INDEPENDENT_AMBULATORY_CARE_PROVIDER_SITE_OTHER): Payer: Managed Care, Other (non HMO) | Admitting: *Deleted

## 2018-12-06 DIAGNOSIS — J309 Allergic rhinitis, unspecified: Secondary | ICD-10-CM

## 2018-12-14 ENCOUNTER — Ambulatory Visit (INDEPENDENT_AMBULATORY_CARE_PROVIDER_SITE_OTHER): Payer: Managed Care, Other (non HMO) | Admitting: *Deleted

## 2018-12-14 DIAGNOSIS — J309 Allergic rhinitis, unspecified: Secondary | ICD-10-CM | POA: Diagnosis not present

## 2018-12-20 ENCOUNTER — Ambulatory Visit (INDEPENDENT_AMBULATORY_CARE_PROVIDER_SITE_OTHER): Payer: Managed Care, Other (non HMO)

## 2018-12-20 DIAGNOSIS — J309 Allergic rhinitis, unspecified: Secondary | ICD-10-CM

## 2018-12-26 ENCOUNTER — Ambulatory Visit (INDEPENDENT_AMBULATORY_CARE_PROVIDER_SITE_OTHER): Payer: Managed Care, Other (non HMO) | Admitting: *Deleted

## 2018-12-26 ENCOUNTER — Encounter: Payer: Self-pay | Admitting: Gynecology

## 2018-12-26 DIAGNOSIS — J309 Allergic rhinitis, unspecified: Secondary | ICD-10-CM

## 2019-01-03 ENCOUNTER — Ambulatory Visit (INDEPENDENT_AMBULATORY_CARE_PROVIDER_SITE_OTHER): Payer: Managed Care, Other (non HMO)

## 2019-01-03 DIAGNOSIS — J309 Allergic rhinitis, unspecified: Secondary | ICD-10-CM

## 2019-01-09 ENCOUNTER — Ambulatory Visit (INDEPENDENT_AMBULATORY_CARE_PROVIDER_SITE_OTHER): Payer: Managed Care, Other (non HMO) | Admitting: *Deleted

## 2019-01-09 DIAGNOSIS — J309 Allergic rhinitis, unspecified: Secondary | ICD-10-CM | POA: Diagnosis not present

## 2019-01-16 ENCOUNTER — Ambulatory Visit (INDEPENDENT_AMBULATORY_CARE_PROVIDER_SITE_OTHER): Payer: Managed Care, Other (non HMO) | Admitting: *Deleted

## 2019-01-16 DIAGNOSIS — J309 Allergic rhinitis, unspecified: Secondary | ICD-10-CM | POA: Diagnosis not present

## 2019-01-25 ENCOUNTER — Ambulatory Visit (INDEPENDENT_AMBULATORY_CARE_PROVIDER_SITE_OTHER): Payer: Managed Care, Other (non HMO)

## 2019-01-25 DIAGNOSIS — J309 Allergic rhinitis, unspecified: Secondary | ICD-10-CM

## 2019-01-31 ENCOUNTER — Ambulatory Visit (INDEPENDENT_AMBULATORY_CARE_PROVIDER_SITE_OTHER): Payer: Managed Care, Other (non HMO) | Admitting: *Deleted

## 2019-01-31 DIAGNOSIS — J309 Allergic rhinitis, unspecified: Secondary | ICD-10-CM | POA: Diagnosis not present

## 2019-02-04 ENCOUNTER — Other Ambulatory Visit: Payer: Self-pay | Admitting: Family Medicine

## 2019-02-06 ENCOUNTER — Ambulatory Visit (INDEPENDENT_AMBULATORY_CARE_PROVIDER_SITE_OTHER): Payer: Managed Care, Other (non HMO)

## 2019-02-06 DIAGNOSIS — J309 Allergic rhinitis, unspecified: Secondary | ICD-10-CM | POA: Diagnosis not present

## 2019-02-07 ENCOUNTER — Other Ambulatory Visit: Payer: Self-pay

## 2019-02-07 ENCOUNTER — Ambulatory Visit (INDEPENDENT_AMBULATORY_CARE_PROVIDER_SITE_OTHER): Payer: Managed Care, Other (non HMO)

## 2019-02-07 DIAGNOSIS — M810 Age-related osteoporosis without current pathological fracture: Secondary | ICD-10-CM | POA: Diagnosis not present

## 2019-02-11 ENCOUNTER — Encounter: Payer: Self-pay | Admitting: Gynecology

## 2019-02-13 ENCOUNTER — Ambulatory Visit (INDEPENDENT_AMBULATORY_CARE_PROVIDER_SITE_OTHER): Payer: Managed Care, Other (non HMO) | Admitting: *Deleted

## 2019-02-13 DIAGNOSIS — J309 Allergic rhinitis, unspecified: Secondary | ICD-10-CM

## 2019-02-21 ENCOUNTER — Ambulatory Visit (INDEPENDENT_AMBULATORY_CARE_PROVIDER_SITE_OTHER): Payer: Managed Care, Other (non HMO) | Admitting: *Deleted

## 2019-02-21 DIAGNOSIS — J309 Allergic rhinitis, unspecified: Secondary | ICD-10-CM | POA: Diagnosis not present

## 2019-02-26 NOTE — Telephone Encounter (Signed)
Patient informed with dexa results.  

## 2019-02-27 ENCOUNTER — Ambulatory Visit (INDEPENDENT_AMBULATORY_CARE_PROVIDER_SITE_OTHER): Payer: Managed Care, Other (non HMO) | Admitting: *Deleted

## 2019-02-27 DIAGNOSIS — J309 Allergic rhinitis, unspecified: Secondary | ICD-10-CM

## 2019-03-06 ENCOUNTER — Ambulatory Visit (INDEPENDENT_AMBULATORY_CARE_PROVIDER_SITE_OTHER): Payer: Managed Care, Other (non HMO) | Admitting: *Deleted

## 2019-03-06 DIAGNOSIS — J309 Allergic rhinitis, unspecified: Secondary | ICD-10-CM | POA: Diagnosis not present

## 2019-03-13 ENCOUNTER — Ambulatory Visit (INDEPENDENT_AMBULATORY_CARE_PROVIDER_SITE_OTHER): Payer: Managed Care, Other (non HMO) | Admitting: *Deleted

## 2019-03-13 DIAGNOSIS — J309 Allergic rhinitis, unspecified: Secondary | ICD-10-CM

## 2019-03-21 ENCOUNTER — Ambulatory Visit (INDEPENDENT_AMBULATORY_CARE_PROVIDER_SITE_OTHER): Payer: Managed Care, Other (non HMO)

## 2019-03-21 DIAGNOSIS — J309 Allergic rhinitis, unspecified: Secondary | ICD-10-CM | POA: Diagnosis not present

## 2019-03-28 ENCOUNTER — Ambulatory Visit (INDEPENDENT_AMBULATORY_CARE_PROVIDER_SITE_OTHER): Payer: Managed Care, Other (non HMO) | Admitting: *Deleted

## 2019-03-28 DIAGNOSIS — J309 Allergic rhinitis, unspecified: Secondary | ICD-10-CM

## 2019-04-01 DIAGNOSIS — J301 Allergic rhinitis due to pollen: Secondary | ICD-10-CM

## 2019-04-01 NOTE — Progress Notes (Signed)
VIALS EXP 03-31-2020

## 2019-04-04 ENCOUNTER — Ambulatory Visit (INDEPENDENT_AMBULATORY_CARE_PROVIDER_SITE_OTHER): Payer: Managed Care, Other (non HMO) | Admitting: *Deleted

## 2019-04-04 DIAGNOSIS — J309 Allergic rhinitis, unspecified: Secondary | ICD-10-CM | POA: Diagnosis not present

## 2019-04-09 ENCOUNTER — Other Ambulatory Visit: Payer: Self-pay

## 2019-04-09 ENCOUNTER — Encounter: Payer: Self-pay | Admitting: Allergy and Immunology

## 2019-04-09 ENCOUNTER — Ambulatory Visit (INDEPENDENT_AMBULATORY_CARE_PROVIDER_SITE_OTHER): Payer: Managed Care, Other (non HMO) | Admitting: Allergy and Immunology

## 2019-04-09 ENCOUNTER — Ambulatory Visit: Payer: Self-pay | Admitting: *Deleted

## 2019-04-09 VITALS — BP 122/80 | HR 73 | Resp 16 | Ht 61.0 in | Wt 148.4 lb

## 2019-04-09 DIAGNOSIS — J301 Allergic rhinitis due to pollen: Secondary | ICD-10-CM | POA: Diagnosis not present

## 2019-04-09 DIAGNOSIS — J309 Allergic rhinitis, unspecified: Secondary | ICD-10-CM

## 2019-04-09 DIAGNOSIS — L989 Disorder of the skin and subcutaneous tissue, unspecified: Secondary | ICD-10-CM

## 2019-04-09 DIAGNOSIS — T781XXD Other adverse food reactions, not elsewhere classified, subsequent encounter: Secondary | ICD-10-CM

## 2019-04-09 DIAGNOSIS — J4520 Mild intermittent asthma, uncomplicated: Secondary | ICD-10-CM | POA: Diagnosis not present

## 2019-04-09 DIAGNOSIS — L308 Other specified dermatitis: Secondary | ICD-10-CM

## 2019-04-09 DIAGNOSIS — T7800XD Anaphylactic reaction due to unspecified food, subsequent encounter: Secondary | ICD-10-CM

## 2019-04-09 DIAGNOSIS — J3089 Other allergic rhinitis: Secondary | ICD-10-CM | POA: Diagnosis not present

## 2019-04-09 NOTE — Progress Notes (Signed)
[facility] [place] [facility] [place]   Follow-up Note  Referring Provider: Lucille Passy, MD Primary Provider: Lucille Passy, MD Date of Office Visit: 04/09/2019  Subjective:   Yolanda [name] (DOB: 1961-07-14) is a 58 y.o. female who returns to the [facility] on 04/09/2019 in re-evaluation of the following:  HPI: Yolanda [name] presents to this clinic in evaluation of allergic rhinitis and intermittent asthma and oral allergy syndrome and food allergy and a history of an inflammatory dermatosis.  I last saw her in this clinic on 17 July 2018.  Her immunotherapy is going quite good currently at every week without any adverse effect.  She does develop a large local reaction on occasion at the injection site even while using her Allegra every morning.  She had a much better spring regarding her eyes and nose.  She still continues to use some Nasonex and eyedrops during the spring.  She has ventured out regarding eating some of her "oral allergy foods".  She can now eat tomatoes with no problem and can eat asparagus with no problem.  She can eat half a banana but if she eats a whole banana she does develop GI upset and a throat tightness.  She has had no problems with her inflammatory dermatosis and rarely uses any topical steroids.    She has had no problems with her asthma and rarely uses a short acting bronchodilator.  Allergies as of 04/09/2019      Reactions   Honey Bee Treatment [bee Venom] Anaphylaxis   Eggs Or Egg-derived Products    Stomach ache   Peanut-containing Drug Products Other (See Comments)   Allergy testing   Sesame Seed (diagnostic) Other (See Comments)   Allergy test   Soy Allergy Other (See Comments)   Allergy test   Wheat Bran    Intolerance       Medication List      albuterol 108 (90 Base) MCG/ACT inhaler Commonly known as: VENTOLIN HFA Inhale 2 puffs into the lungs every 6 (six) hours as needed for wheezing or shortness  of breath.   Aplenzin 174 MG Tb24 Generic drug: BuPROPion HBr Take 1 tablet by mouth daily.   CALCIUM 600 PO Take 1 tablet by mouth daily.   EPINEPHrine 0.3 mg/0.3 mL Soaj injection Commonly known as: EPI-PEN Inject 0.3 mg into the muscle as needed for anaphylaxis.   LORazepam 0.5 MG tablet Commonly known as: ATIVAN Take 0.5 mg by mouth daily as needed for sleep.   MAGNESIUM GLYCINATE PLUS PO Take 2 capsules by mouth at bedtime.   mometasone 50 MCG/ACT nasal spray Commonly known as: NASONEX Place 2 sprays into the nose daily.   montelukast 10 MG tablet Commonly known as: SINGULAIR TAKE 1 TABLET(10 MG) BY MOUTH AT BEDTIME   multivitamin tablet Take 1 tablet by mouth 2 (two) times daily.   olopatadine 0.1 % ophthalmic solution Commonly known as: PATANOL Place 1 drop into both eyes 2 (two) times daily.   OMEGAPURE 780 EC PO Take 1 tablet by mouth daily.   risedronate 150 MG tablet Commonly known as: ACTONEL TAKE ONE TABLET(150MG) BY MOUTH EVERY 30 DAYS WITH WATER ON EMPTY STOMAACH NOTHING BY MOUTH AND DON'T LIE DOWN FOR 30 MINUTES       Past Medical History:  Diagnosis Date  . Allergy   . BRCA negative 12/2014  . Depression   . Esophagitis   . GERD (gastroesophageal reflux disease)   .  Hiatal hernia   . History of diverticulitis   . IC (interstitial cystitis)   . Osteoporosis 01/2019   2018 T score -2.6, 2020 T score -2.0 on Actonel  . Schatzki's ring     Past Surgical History:  Procedure Laterality Date  . CHOLECYSTECTOMY    . HYSTEROSCOPY  12/2003   RESECTON OF SUBMUCOUS  MYOMA/DX SCOPE  . LAPAROSCOPIC APPENDECTOMY N/A 09/14/2018   Procedure: APPENDECTOMY LAPAROSCOPIC;  Surgeon: Ileana Roup, MD;  Location: [facility];  Service: General;  Laterality: N/A;  . LAPAROSCOPIC BILATERAL SALPINGO OOPHERECTOMY Bilateral 07/02/2014   Procedure: LAPAROSCOPIC BILATERAL SALPINGO OOPHORECTOMY;  Surgeon: Anastasio Auerbach, MD;  Location: [facility];  Service:  Gynecology;  Laterality: Bilateral;  . PELVIC LAPAROSCOPY  12/2003   DX SCOPE/RESECTION OF SUBMUCOUS MYOMA  . TMJ ARTHROSCOPY  11-26-2001  . VAGINAL HYSTERECTOMY  12/05   Menorrhagia/dysmenorrhea. Pathology showed leiomyoma/adenomyosis    Review of systems negative except as noted in HPI / PMHx or noted below:  Review of Systems  Constitutional: Negative.   HENT: Negative.   Eyes: Negative.   Respiratory: Negative.   Cardiovascular: Negative.   Gastrointestinal: Negative.   Genitourinary: Negative.   Musculoskeletal: Negative.   Skin: Negative.   Neurological: Negative.   Endo/Heme/Allergies: Negative.   Psychiatric/Behavioral: Negative.      Objective:   Vitals:   04/09/19 1535  BP: 122/80  Pulse: 73  Resp: 16  SpO2: 99%   Height: 5' 1"  (154.9 cm)  Weight: 148 lb 6.4 oz (67.3 kg)   Physical Exam Constitutional:      Appearance: She is not diaphoretic.  HENT:     Head: Normocephalic.     Right Ear: Tympanic membrane, ear canal and external ear normal.     Left Ear: Tympanic membrane, ear canal and external ear normal.     Nose: Nose normal. No mucosal edema or rhinorrhea.     Mouth/Throat:     Pharynx: Uvula midline. No oropharyngeal exudate.  Eyes:     Conjunctiva/sclera: Conjunctivae normal.  Neck:     Thyroid: No thyromegaly.     Trachea: Trachea normal. No tracheal tenderness or tracheal deviation.  Cardiovascular:     Rate and Rhythm: Normal rate and regular rhythm.     Heart sounds: Normal heart sounds, S1 normal and S2 normal. No murmur.  Pulmonary:     Effort: No respiratory distress.     Breath sounds: Normal breath sounds. No stridor. No wheezing or rales.  Lymphadenopathy:     Head:     Right side of head: No tonsillar adenopathy.     Left side of head: No tonsillar adenopathy.     Cervical: No cervical adenopathy.  Skin:    Findings: No erythema or rash.     Nails: There is no clubbing.   Neurological:     Mental Status: She is alert.      Diagnostics: none  Assessment and Plan:   1. Asthma, mild intermittent, well-controlled   2. Perennial allergic rhinitis   3. Seasonal allergic rhinitis due to pollen   4. Oral allergy syndrome, subsequent encounter   5. Anaphylactic shock due to food, subsequent encounter   6. Inflammatory dermatosis     1.  Continue to perform Allergen avoidance measures as best as possible  2.  Continue immunotherapy.  Can use cetirizine 10 mg on morning of immunotherapy.  3. Continue Nasonex and Allegra  4.  If needed:   A.  Pro-air HFA or similar  2 inhalations every 4-6 hours.  B.  Patanol 1 drop each eye twice a day  C.  Triamcinolone cream  D.   EpiPen Cammie Sickle, Benadryl, MD/ER for severe allergic reaction  5.  Return to clinic in 6 months or earlier if problem  6.  Obtain fall flu vaccine (and COVID vaccine)   Posey has really done quite well and I think that immunotherapy is beginning to really change her immune system significantly based upon her 2020 springtime experience and the fact that she can now eat several foods that gave her problems in the past.  We will continue her on immunotherapy and other medications as noted above and see her back in this clinic in 6 months or earlier if there is a problem.  She can add in cetirizine on the morning of her immunotherapy to hopefully address her large local reactions that sometimes occur with this treatment.  Allena Katz, MD Allergy / Immunology [place]

## 2019-04-09 NOTE — Patient Instructions (Addendum)
  1.  Continue to perform Allergen avoidance measures as best as possible  2.  Continue immunotherapy.  Can use cetirizine 10 mg on morning of immunotherapy.  3. Continue Nasonex and Allegra  4.  If needed:   A.  Pro-air HFA or similar 2 inhalations every 4-6 hours.  B.  Patanol 1 drop each eye twice a day  C.  Triamcinolone cream  D.   EpiPen Yolanda [name], Benadryl, MD/ER for severe allergic reaction  5.  Return to clinic in 6 months or earlier if problem  6.  Obtain fall flu vaccine (and COVID vaccine)

## 2019-04-10 ENCOUNTER — Encounter: Payer: Self-pay | Admitting: Allergy and Immunology

## 2019-04-17 ENCOUNTER — Ambulatory Visit (INDEPENDENT_AMBULATORY_CARE_PROVIDER_SITE_OTHER): Payer: Managed Care, Other (non HMO)

## 2019-04-17 DIAGNOSIS — J309 Allergic rhinitis, unspecified: Secondary | ICD-10-CM

## 2019-04-26 ENCOUNTER — Ambulatory Visit (INDEPENDENT_AMBULATORY_CARE_PROVIDER_SITE_OTHER): Payer: Managed Care, Other (non HMO) | Admitting: *Deleted

## 2019-04-26 DIAGNOSIS — J309 Allergic rhinitis, unspecified: Secondary | ICD-10-CM | POA: Diagnosis not present

## 2019-05-01 ENCOUNTER — Ambulatory Visit (INDEPENDENT_AMBULATORY_CARE_PROVIDER_SITE_OTHER): Payer: Managed Care, Other (non HMO) | Admitting: *Deleted

## 2019-05-01 DIAGNOSIS — J309 Allergic rhinitis, unspecified: Secondary | ICD-10-CM | POA: Diagnosis not present

## 2019-05-09 ENCOUNTER — Other Ambulatory Visit: Payer: Self-pay | Admitting: Family Medicine

## 2019-05-13 ENCOUNTER — Ambulatory Visit (INDEPENDENT_AMBULATORY_CARE_PROVIDER_SITE_OTHER): Payer: Managed Care, Other (non HMO)

## 2019-05-13 DIAGNOSIS — J309 Allergic rhinitis, unspecified: Secondary | ICD-10-CM

## 2019-05-15 ENCOUNTER — Encounter: Payer: Self-pay | Admitting: Gynecology

## 2019-05-20 ENCOUNTER — Ambulatory Visit (INDEPENDENT_AMBULATORY_CARE_PROVIDER_SITE_OTHER): Payer: Managed Care, Other (non HMO)

## 2019-05-20 DIAGNOSIS — J309 Allergic rhinitis, unspecified: Secondary | ICD-10-CM | POA: Diagnosis not present

## 2019-05-27 DIAGNOSIS — M545 Low back pain, unspecified: Secondary | ICD-10-CM | POA: Insufficient documentation

## 2019-05-29 ENCOUNTER — Ambulatory Visit (INDEPENDENT_AMBULATORY_CARE_PROVIDER_SITE_OTHER): Payer: Managed Care, Other (non HMO) | Admitting: *Deleted

## 2019-05-29 DIAGNOSIS — J309 Allergic rhinitis, unspecified: Secondary | ICD-10-CM

## 2019-05-30 ENCOUNTER — Other Ambulatory Visit: Payer: Self-pay

## 2019-05-30 DIAGNOSIS — Z20822 Contact with and (suspected) exposure to covid-19: Secondary | ICD-10-CM

## 2019-05-31 LAB — NOVEL CORONAVIRUS, NAA: SARS-CoV-2, NAA: NOT DETECTED

## 2019-06-05 ENCOUNTER — Ambulatory Visit (INDEPENDENT_AMBULATORY_CARE_PROVIDER_SITE_OTHER): Payer: Managed Care, Other (non HMO)

## 2019-06-05 DIAGNOSIS — J309 Allergic rhinitis, unspecified: Secondary | ICD-10-CM

## 2019-06-12 ENCOUNTER — Ambulatory Visit (INDEPENDENT_AMBULATORY_CARE_PROVIDER_SITE_OTHER): Payer: Managed Care, Other (non HMO) | Admitting: *Deleted

## 2019-06-12 DIAGNOSIS — J309 Allergic rhinitis, unspecified: Secondary | ICD-10-CM | POA: Diagnosis not present

## 2019-06-18 ENCOUNTER — Ambulatory Visit (INDEPENDENT_AMBULATORY_CARE_PROVIDER_SITE_OTHER): Payer: Managed Care, Other (non HMO)

## 2019-06-18 DIAGNOSIS — J309 Allergic rhinitis, unspecified: Secondary | ICD-10-CM

## 2019-06-26 ENCOUNTER — Ambulatory Visit (INDEPENDENT_AMBULATORY_CARE_PROVIDER_SITE_OTHER): Payer: Managed Care, Other (non HMO) | Admitting: *Deleted

## 2019-06-26 DIAGNOSIS — J309 Allergic rhinitis, unspecified: Secondary | ICD-10-CM

## 2019-07-01 ENCOUNTER — Encounter: Payer: Self-pay | Admitting: Obstetrics and Gynecology

## 2019-07-05 ENCOUNTER — Ambulatory Visit (INDEPENDENT_AMBULATORY_CARE_PROVIDER_SITE_OTHER): Payer: Managed Care, Other (non HMO) | Admitting: *Deleted

## 2019-07-05 DIAGNOSIS — J309 Allergic rhinitis, unspecified: Secondary | ICD-10-CM

## 2019-07-11 ENCOUNTER — Ambulatory Visit (INDEPENDENT_AMBULATORY_CARE_PROVIDER_SITE_OTHER): Payer: Managed Care, Other (non HMO)

## 2019-07-11 DIAGNOSIS — J309 Allergic rhinitis, unspecified: Secondary | ICD-10-CM

## 2019-07-16 ENCOUNTER — Ambulatory Visit (INDEPENDENT_AMBULATORY_CARE_PROVIDER_SITE_OTHER): Payer: Managed Care, Other (non HMO)

## 2019-07-16 DIAGNOSIS — J309 Allergic rhinitis, unspecified: Secondary | ICD-10-CM

## 2019-07-23 ENCOUNTER — Ambulatory Visit (INDEPENDENT_AMBULATORY_CARE_PROVIDER_SITE_OTHER): Payer: Managed Care, Other (non HMO) | Admitting: *Deleted

## 2019-07-23 DIAGNOSIS — J309 Allergic rhinitis, unspecified: Secondary | ICD-10-CM

## 2019-07-30 ENCOUNTER — Other Ambulatory Visit: Payer: Self-pay

## 2019-07-30 DIAGNOSIS — J301 Allergic rhinitis due to pollen: Secondary | ICD-10-CM | POA: Diagnosis not present

## 2019-07-30 MED ORDER — MONTELUKAST SODIUM 10 MG PO TABS: ORAL_TABLET | ORAL | 5 refills | Status: DC

## 2019-07-30 NOTE — Progress Notes (Signed)
VIALS EXP 07-29-20

## 2019-07-30 NOTE — Telephone Encounter (Signed)
Last OV 09/14/18 Last fill 02/04/19  #30/5 Does pt need a f/u visit?

## 2019-07-31 ENCOUNTER — Ambulatory Visit (INDEPENDENT_AMBULATORY_CARE_PROVIDER_SITE_OTHER): Payer: Managed Care, Other (non HMO)

## 2019-07-31 DIAGNOSIS — J309 Allergic rhinitis, unspecified: Secondary | ICD-10-CM

## 2019-08-06 ENCOUNTER — Ambulatory Visit (INDEPENDENT_AMBULATORY_CARE_PROVIDER_SITE_OTHER): Payer: Managed Care, Other (non HMO)

## 2019-08-06 DIAGNOSIS — J309 Allergic rhinitis, unspecified: Secondary | ICD-10-CM

## 2019-08-12 ENCOUNTER — Ambulatory Visit (INDEPENDENT_AMBULATORY_CARE_PROVIDER_SITE_OTHER): Payer: Managed Care, Other (non HMO)

## 2019-08-12 DIAGNOSIS — J309 Allergic rhinitis, unspecified: Secondary | ICD-10-CM | POA: Diagnosis not present

## 2019-08-20 ENCOUNTER — Ambulatory Visit (INDEPENDENT_AMBULATORY_CARE_PROVIDER_SITE_OTHER): Payer: Managed Care, Other (non HMO)

## 2019-08-20 DIAGNOSIS — J309 Allergic rhinitis, unspecified: Secondary | ICD-10-CM | POA: Diagnosis not present

## 2019-08-27 ENCOUNTER — Ambulatory Visit (INDEPENDENT_AMBULATORY_CARE_PROVIDER_SITE_OTHER): Payer: Managed Care, Other (non HMO)

## 2019-08-27 DIAGNOSIS — J309 Allergic rhinitis, unspecified: Secondary | ICD-10-CM

## 2019-09-03 ENCOUNTER — Ambulatory Visit (INDEPENDENT_AMBULATORY_CARE_PROVIDER_SITE_OTHER): Payer: Managed Care, Other (non HMO)

## 2019-09-03 DIAGNOSIS — J309 Allergic rhinitis, unspecified: Secondary | ICD-10-CM | POA: Diagnosis not present

## 2019-09-09 DIAGNOSIS — M5416 Radiculopathy, lumbar region: Secondary | ICD-10-CM | POA: Insufficient documentation

## 2019-09-10 ENCOUNTER — Ambulatory Visit (INDEPENDENT_AMBULATORY_CARE_PROVIDER_SITE_OTHER): Payer: Managed Care, Other (non HMO)

## 2019-09-10 ENCOUNTER — Encounter: Payer: Managed Care, Other (non HMO) | Admitting: Gynecology

## 2019-09-10 DIAGNOSIS — J309 Allergic rhinitis, unspecified: Secondary | ICD-10-CM | POA: Diagnosis not present

## 2019-09-12 ENCOUNTER — Encounter: Payer: Self-pay | Admitting: Obstetrics and Gynecology

## 2019-09-12 ENCOUNTER — Other Ambulatory Visit: Payer: Self-pay

## 2019-09-12 ENCOUNTER — Ambulatory Visit (INDEPENDENT_AMBULATORY_CARE_PROVIDER_SITE_OTHER): Payer: Managed Care, Other (non HMO) | Admitting: Obstetrics and Gynecology

## 2019-09-12 VITALS — BP 122/60 | HR 66 | Temp 98.2°F | Ht 61.0 in | Wt 146.0 lb

## 2019-09-12 DIAGNOSIS — Z8739 Personal history of other diseases of the musculoskeletal system and connective tissue: Secondary | ICD-10-CM | POA: Diagnosis not present

## 2019-09-12 DIAGNOSIS — Z0000 Encounter for general adult medical examination without abnormal findings: Secondary | ICD-10-CM

## 2019-09-12 DIAGNOSIS — Z833 Family history of diabetes mellitus: Secondary | ICD-10-CM | POA: Diagnosis not present

## 2019-09-12 DIAGNOSIS — Z803 Family history of malignant neoplasm of breast: Secondary | ICD-10-CM | POA: Diagnosis not present

## 2019-09-12 DIAGNOSIS — R6882 Decreased libido: Secondary | ICD-10-CM

## 2019-09-12 DIAGNOSIS — N941 Unspecified dyspareunia: Secondary | ICD-10-CM

## 2019-09-12 DIAGNOSIS — Z01419 Encounter for gynecological examination (general) (routine) without abnormal findings: Secondary | ICD-10-CM | POA: Diagnosis not present

## 2019-09-12 DIAGNOSIS — Z8049 Family history of malignant neoplasm of other genital organs: Secondary | ICD-10-CM

## 2019-09-12 MED ORDER — ESTRADIOL 10 MCG VA TABS: ORAL_TABLET | VAGINAL | 3 refills | Status: DC

## 2019-09-12 NOTE — Progress Notes (Signed)
59 y.o. G11P2002 Married White or Caucasian Not Hispanic or Latino female here for annual exam.  Patient states that recently on a MRI they saw cyst on Right Kidney. Vaginal Dryness H/O vaginal hysterectomy, then later laparoscopic BSO. Not on ERT. No vasomotor symptoms. She does have vaginal dryness and no libido. Not having orgasms currently. Sex is painful even with a lubricant.     Has horrible allergies, started allergy shots a year ago and getting so much better.   She fractured her coccyx in the last year in the last year, has some herniated disc's. Can't exercise, has gained weight, not sleeping well.   No LMP recorded. Patient has had a hysterectomy.          Sexually active: Yes.    The current method of family planning is status post hysterectomy.    Exercising: Yes.    Tennis and walking  Smoker:  no  Health Maintenance: Pap:  06/24/14 Normal  History of abnormal Pap:  no MMG:  11/27/18 WNL  BMD:   02/07/19 Osteoporosis, on actonel  Colonoscopy: 03/20/13 normal, f/u in 10 years  TDaP:  03/20/18 Gardasil: NA   reports that she has never smoked. She has never used smokeless tobacco. She reports current alcohol use of about 2.0 standard drinks of alcohol per week. She reports that she does not use drugs. Homemaker. 2 daughters. One Daughter in in [place], she is a Neurosurgeon. Other daughter is local, having a baby boy in July.   Past Medical History:  Diagnosis Date  . Allergy   . BRCA negative 12/2014  . Depression   . Esophagitis   . GERD (gastroesophageal reflux disease)   . Hiatal hernia   . History of diverticulitis   . IC (interstitial cystitis)   . Osteoporosis 01/2019   2018 T score -2.6, 2020 T score -2.0 on Actonel  . Schatzki's ring   Reflux and IC are well controlled with dietary changes.   Past Surgical History:  Procedure Laterality Date  . CHOLECYSTECTOMY    . HYSTEROSCOPY  12/2003   RESECTON OF SUBMUCOUS  MYOMA/DX SCOPE  . LAPAROSCOPIC APPENDECTOMY N/A  09/14/2018   Procedure: APPENDECTOMY LAPAROSCOPIC;  Surgeon: Ileana Roup, MD;  Location: [facility];  Service: General;  Laterality: N/A;  . LAPAROSCOPIC BILATERAL SALPINGO OOPHERECTOMY Bilateral 07/02/2014   Procedure: LAPAROSCOPIC BILATERAL SALPINGO OOPHORECTOMY;  Surgeon: Anastasio Auerbach, MD;  Location: [facility];  Service: Gynecology;  Laterality: Bilateral;  . PELVIC LAPAROSCOPY  12/2003   DX SCOPE/RESECTION OF SUBMUCOUS MYOMA  . TMJ ARTHROSCOPY  11-26-2001  . VAGINAL HYSTERECTOMY  12/05   Menorrhagia/dysmenorrhea. Pathology showed leiomyoma/adenomyosis    Current Outpatient Medications  Medication Sig Dispense Refill  . albuterol (PROVENTIL HFA;VENTOLIN HFA) 108 (90 Base) MCG/ACT inhaler Inhale 2 puffs into the lungs every 6 (six) hours as needed for wheezing or shortness of breath. 1 Inhaler 2  . APLENZIN 174 MG TB24 Take 1 tablet by mouth daily.  0  . Calcium Carbonate (CALCIUM 600 PO) Take 1 tablet by mouth daily.    Marland Kitchen EPINEPHrine 0.3 mg/0.3 mL IJ SOAJ injection Inject 0.3 mg into the muscle as needed for anaphylaxis.   2  . LORazepam (ATIVAN) 0.5 MG tablet Take 0.5 mg by mouth daily as needed for sleep.     Marland Kitchen MAGNESIUM GLYCINATE PLUS PO Take 2 capsules by mouth at bedtime.    . mometasone (NASONEX) 50 MCG/ACT nasal spray SHAKE LIQUID AND USE 2 SPRAYS IN EACH NOSTRIL DAILY  17 g 5  . montelukast (SINGULAIR) 10 MG tablet TAKE 1 TABLET(10 MG) BY MOUTH AT BEDTIME 30 tablet 5  . Multiple Vitamin (MULTIVITAMIN) tablet Take 1 tablet by mouth 2 (two) times daily.     Marland Kitchen olopatadine (PATANOL) 0.1 % ophthalmic solution Place 1 drop into both eyes 2 (two) times daily. 5 mL 5  . Omega-3 Fatty Acids (OMEGAPURE 780 EC PO) Take 1 tablet by mouth daily.    . risedronate (ACTONEL) 150 MG tablet TAKE ONE TABLET(150MG) BY MOUTH EVERY 30 DAYS WITH WATER ON EMPTY STOMAACH NOTHING BY MOUTH AND DON'T LIE DOWN FOR 30 MINUTES 1 tablet 11   No current facility-administered medications for this visit.     Family History  Problem Relation Age of Onset  . Uterine cancer Maternal Grandmother        dx in her 56s  . Breast cancer Sister        paternal half sister dx in her early 2s  . Cystic fibrosis Other        brother's granddauther  . Emphysema Father   . Early death Father   . Breast cancer Sister        paternal half sister dx in her late 75s  . Cystic fibrosis Other        brothers granddaughters  . Depression Mother   . Hyperlipidemia Mother   . Hypertension Mother   . Mental illness Mother   . Miscarriages / [place] Mother   . Diabetes Brother 50       HALF BROTHER/HALF BROTHER   . Breast cancer Sister   . GI problems Brother   . Colon cancer Neg Hx   . Esophageal cancer Neg Hx   . Rectal cancer Neg Hx   . Stomach cancer Neg Hx   . Allergic rhinitis Neg Hx   . Asthma Neg Hx   . Eczema Neg Hx   . Urticaria Neg Hx   3 1/2 sisters (fathers side) with breast cancer, doesn't know if they had genetic testing. She had negative genetic testing.   Review of Systems  All other systems reviewed and are negative.   Exam:   BP 122/60   Pulse 66   Temp 98.2 F (36.8 C)   Ht 5' 1" (1.549 m)   Wt 146 lb (66.2 kg)   SpO2 98%   BMI 27.59 kg/m   Weight change: _0 @ Height:   Height: 5' 1" (154.9 cm)  Ht Readings from Last 3 Encounters:  09/12/19 5' 1" (1.549 m)  04/09/19 5' 1" (1.549 m)  09/14/18 5' 1" (1.549 m)    General appearance: alert, cooperative and appears stated age Head: Normocephalic, without obvious abnormality, atraumatic Neck: no adenopathy, supple, symmetrical, trachea midline and thyroid normal to inspection and palpation Lungs: clear to auscultation bilaterally Cardiovascular: regular rate and rhythm Breasts: normal appearance, no masses or tenderness Abdomen: soft, non-tender; non distended,  no masses,  no organomegaly Extremities: extremities normal, atraumatic, no cyanosis or edema Skin: Skin color, texture, turgor normal. No  rashes or lesions Lymph nodes: Cervical, supraclavicular, and axillary nodes normal. No abnormal inguinal nodes palpated Neurologic: Grossly normal   Pelvic: External genitalia:  no lesions              Urethra:  normal appearing urethra with no masses, tenderness or lesions              Bartholins and Skenes: normal  Vagina: normal appearing vagina with normal color and discharge, no lesions              Cervix: absent               Bimanual Exam:  Uterus:  uterus absent              Adnexa: no mass, fullness, tenderness               Rectovaginal: Confirms               Anus:  normal sphincter tone, no lesions  Gae Dry chaperoned for the exam.  A:  Well Woman with normal exam  Low libido  Vaginal atrophy  Dyspareunia  Risk of breast cancer is 15.9%  Osteoporosis, on Actonel      P:   No pap needed  Screening labs  Discussed breast self exam  Discussed calcium and vit D intake  Mammogram due in 4/21, discussed option of abbreviated breast MRI (would do 6 months after the mammogram if she would like to do it)  Start vaginal estrogen  Testosterone levels, discussed the option of topical testosterone, including the risks and that it isn't FDA approved  DEXA in 2022  Colonoscopy UTD

## 2019-09-12 NOTE — Patient Instructions (Signed)
EXERCISE AND DIET:  We recommended that you start or continue a regular exercise program for good health. Regular exercise means any activity that makes your heart beat faster and makes you sweat.  We recommend exercising at least 30 minutes per day at least 3 days a week, preferably 4 or 5.  We also recommend a diet low in fat and sugar.  Inactivity, poor dietary choices and obesity can cause diabetes, heart attack, stroke, and kidney damage, among others.    ALCOHOL AND SMOKING:  Women should limit their alcohol intake to no more than 7 drinks/beers/glasses of wine (combined, not each!) per week. Moderation of alcohol intake to this level decreases your risk of breast cancer and liver damage. And of course, no recreational drugs are part of a healthy lifestyle.  And absolutely no smoking or even second hand smoke. Most people know smoking can cause heart and lung diseases, but did you know it also contributes to weakening of your bones? Aging of your skin?  Yellowing of your teeth and nails?  CALCIUM AND VITAMIN D:  Adequate intake of calcium and Vitamin D are recommended.  The recommendations for exact amounts of these supplements seem to change often, but generally speaking 1,200 mg of calcium (between diet and supplement) and 800 units of Vitamin D per day seems prudent. Certain women may benefit from higher intake of Vitamin D.  If you are among these women, your doctor will have told you during your visit.    PAP SMEARS:  Pap smears, to check for cervical cancer or precancers,  have traditionally been done yearly, although recent scientific advances have shown that most women can have pap smears less often.  However, every woman still should have a physical exam from her gynecologist every year. It will include a breast check, inspection of the vulva and vagina to check for abnormal growths or skin changes, a visual exam of the cervix, and then an exam to evaluate the size and shape of the uterus and  ovaries.  And after 57 years of age, a rectal exam is indicated to check for rectal cancers. We will also provide age appropriate advice regarding health maintenance, like when you should have certain vaccines, screening for sexually transmitted diseases, bone density testing, colonoscopy, mammograms, etc.   MAMMOGRAMS:  All women over 40 years old should have a yearly mammogram. Many facilities now offer a "3D" mammogram, which may cost around $50 extra out of pocket. If possible,  we recommend you accept the option to have the 3D mammogram performed.  It both reduces the number of women who will be called back for extra views which then turn out to be normal, and it is better than the routine mammogram at detecting truly abnormal areas.    COLON CANCER SCREENING: Now recommend starting at age 45. At this time colonoscopy is not covered for routine screening until 50. There are take home tests that can be done between 45-49.   COLONOSCOPY:  Colonoscopy to screen for colon cancer is recommended for all women at age 50.  We know, you hate the idea of the prep.  We agree, BUT, having colon cancer and not knowing it is worse!!  Colon cancer so often starts as a polyp that can be seen and removed at colonscopy, which can quite literally save your life!  And if your first colonoscopy is normal and you have no family history of colon cancer, most women don't have to have it again for   10 years.  Once every ten years, you can do something that may end up saving your life, right?  We will be happy to help you get it scheduled when you are ready.  Be sure to check your insurance coverage so you understand how much it will cost.  It may be covered as a preventative service at no cost, but you should check your particular policy.      Breast Self-Awareness Breast self-awareness means being familiar with how your breasts look and feel. It involves checking your breasts regularly and reporting any changes to your  health care provider. Practicing breast self-awareness is important. A change in your breasts can be a sign of a serious medical problem. Being familiar with how your breasts look and feel allows you to find any problems early, when treatment is more likely to be successful. All women should practice breast self-awareness, including women who have had breast implants. How to do a breast self-exam One way to learn what is normal for your breasts and whether your breasts are changing is to do a breast self-exam. To do a breast self-exam: Look for Changes  1. Remove all the clothing above your waist. 2. Stand in front of a mirror in a room with good lighting. 3. Put your hands on your hips. 4. Push your hands firmly downward. 5. Compare your breasts in the mirror. Look for differences between them (asymmetry), such as: ? Differences in shape. ? Differences in size. ? Puckers, dips, and bumps in one breast and not the other. 6. Look at each breast for changes in your skin, such as: ? Redness. ? Scaly areas. 7. Look for changes in your nipples, such as: ? Discharge. ? Bleeding. ? Dimpling. ? Redness. ? A change in position. Feel for Changes Carefully feel your breasts for lumps and changes. It is best to do this while lying on your back on the floor and again while sitting or standing in the shower or tub with soapy water on your skin. Feel each breast in the following way:  Place the arm on the side of the breast you are examining above your head.  Feel your breast with the other hand.  Start in the nipple area and make  inch (2 cm) overlapping circles to feel your breast. Use the pads of your three middle fingers to do this. Apply light pressure, then medium pressure, then firm pressure. The light pressure will allow you to feel the tissue closest to the skin. The medium pressure will allow you to feel the tissue that is a little deeper. The firm pressure will allow you to feel the tissue  close to the ribs.  Continue the overlapping circles, moving downward over the breast until you feel your ribs below your breast.  Move one finger-width toward the center of the body. Continue to use the  inch (2 cm) overlapping circles to feel your breast as you move slowly up toward your collarbone.  Continue the up and down exam using all three pressures until you reach your armpit.  Write Down What You Find  Write down what is normal for each breast and any changes that you find. Keep a written record with breast changes or normal findings for each breast. By writing this information down, you do not need to depend only on memory for size, tenderness, or location. Write down where you are in your menstrual cycle, if you are still menstruating. If you are having trouble noticing differences   in your breasts, do not get discouraged. With time you will become more familiar with the variations in your breasts and more comfortable with the exam. How often should I examine my breasts? Examine your breasts every month. If you are breastfeeding, the best time to examine your breasts is after a feeding or after using a breast pump. If you menstruate, the best time to examine your breasts is 5-7 days after your period is over. During your period, your breasts are lumpier, and it may be more difficult to notice changes. When should I see my health care provider? See your health care provider if you notice:  A change in shape or size of your breasts or nipples.  A change in the skin of your breast or nipples, such as a reddened or scaly area.  Unusual discharge from your nipples.  A lump or thick area that was not there before.  Pain in your breasts.  Anything that concerns you.  Atrophic Vaginitis  Atrophic vaginitis is a condition in which the tissues that line the vagina become dry and thin. This condition is most common in women who have stopped having regular menstrual periods (are in  menopause). This usually starts when a woman is 79-12 years old. That is the time when a woman's estrogen levels begin to drop (decrease). Estrogen is a female hormone. It helps to keep the tissues of the vagina moist. It stimulates the vagina to produce a clear fluid that lubricates the vagina for sexual intercourse. This fluid also protects the vagina from infection. Lack of estrogen can cause the lining of the vagina to get thinner and dryer. The vagina may also shrink in size. It may become less elastic. Atrophic vaginitis tends to get worse over time as a woman's estrogen level drops. What are the causes? This condition is caused by the normal drop in estrogen that happens around the time of menopause. What increases the risk? Certain conditions or situations may lower a woman's estrogen level, leading to a higher risk for atrophic vaginitis. You are more likely to develop this condition if:  You are taking medicines that block estrogen.  You have had your ovaries removed.  You are being treated for cancer with X-ray (radiation) or medicines (chemotherapy).  You have given birth or are breastfeeding.  You are older than age 20.  You smoke. What are the signs or symptoms? Symptoms of this condition include:  Pain, soreness, or bleeding during sexual intercourse (dyspareunia).  Vaginal burning, irritation, or itching.  Pain or bleeding when a speculum is used in a vaginal exam (pelvic exam).  Having burning pain when passing urine.  Vaginal discharge that is brown or yellow. In some cases, there are no symptoms. How is this diagnosed? This condition is diagnosed by taking a medical history and doing a physical exam. This will include a pelvic exam that checks the vaginal tissues. Though rare, you may also have other tests, including:  A urine test.  A test that checks the acid balance in your vagina (acid balance test). How is this treated? Treatment for this condition  depends on how severe your symptoms are. Treatment may include:  Using an over-the-counter vaginal lubricant before sex.  Using a long-acting vaginal moisturizer.  Using low-dose vaginal estrogen for moderate to severe symptoms that do not respond to other treatments. Options include creams, tablets, and inserts (vaginal rings). Before you use a vaginal estrogen, tell your health care provider if you have a history of: ?  Breast cancer. ? Endometrial cancer. ? Blood clots. If you are not sexually active and your symptoms are very mild, you may not need treatment. Follow these instructions at home: Medicines  Take over-the-counter and prescription medicines only as told by your health care provider. Do not use herbal or alternative medicines unless your health care provider says that you can.  Use over-the-counter creams, lubricants, or moisturizers for dryness only as directed by your health care provider. General instructions  If your atrophic vaginitis is caused by menopause, discuss all of your menopause symptoms and treatment options with your health care provider.  Do not douche.  Do not use products that can make your vagina dry. These include: ? Scented feminine sprays. ? Scented tampons. ? Scented soaps.  Vaginal intercourse can help to improve blood flow and elasticity of vaginal tissue. If it hurts to have sex, try using a lubricant or moisturizer just before having intercourse. Contact a health care provider if:  Your discharge looks different than normal.  Your vagina has an unusual smell.  You have new symptoms.  Your symptoms do not improve with treatment.  Your symptoms get worse. Summary  Atrophic vaginitis is a condition in which the tissues that line the vagina become dry and thin. It is most common in women who have stopped having regular menstrual periods (are in menopause).  Treatment options include using vaginal lubricants and low-dose vaginal  estrogen.  Contact a health care provider if your vagina has an unusual smell, or if your symptoms get worse or do not improve after treatment. This information is not intended to replace advice given to you by your health care provider. Make sure you discuss any questions you have with your health care provider. Document Revised: 07/21/2017 Document Reviewed: 05/04/2017 Elsevier Patient Education  2020 [employer]  

## 2019-09-16 ENCOUNTER — Ambulatory Visit (INDEPENDENT_AMBULATORY_CARE_PROVIDER_SITE_OTHER): Payer: Managed Care, Other (non HMO)

## 2019-09-16 DIAGNOSIS — J309 Allergic rhinitis, unspecified: Secondary | ICD-10-CM

## 2019-09-16 NOTE — Progress Notes (Signed)
Virtual Visit via Video   Due to the COVID-19 pandemic, this visit was completed with telemedicine (audio/video) technology to reduce patient and provider exposure as well as to preserve personal protective equipment.   I connected with [name] by a video enabled telemedicine application and verified that I am speaking with the correct person using two identifiers. Location patient: Home Location provider: [facility], Office Persons participating in the virtual visit: Sheilah Mins, MD   I discussed the limitations of evaluation and management by telemedicine and the availability of in person appointments. The patient expressed understanding and agreed to proceed.  Care Team   Patient Care Team: Lucille Passy, MD as PCP - General (Family Medicine)  Subjective:   HPI:   Wants to discuss MRI- I do not see an MRI in Epic.  It appears she had one done at [facility]- MRI of lumbar spine without contrast on 08/01/19.  From [facility] dated 08/12/19:  Imaging: MRI scan of the lumbar spine reviewed in detail with the patient showing multilevel disc bulges resulting in foraminal stenosis at L4-5 and L5-S1.   Left low back pain and radicular symptoms secondary to foraminal stenosis lumbar spine. Recommend L4-5 and L5-S1 SNRB injections from Dr. Nelva Bush. Also recommend Flexeril 10 mg to be taken primarily at night but up to 3 times a day for muscle spasms and pain. Alternate moist heat and ice. Continue stretching and core strengthening. Follow-up in 4 weeks.    1. Low back pain  . cyclobenzaprine 10 mg tablet   . injection, transforaminal epidural, lumbar (PROC) - left L4-L5 and L5-S1 by Dr. Nelva Bush   2. Degenerative lumbar spinal stenosis  3. Degeneration of lumbar intervertebral disc  Discussion Note: None recorded.  Patient educational handouts: No information available.    She did have injections two days ago with Dr. Nelva Bush.  Procedure when well, just had  some nausea and dizziness immediately after.  Yesterday and today she feels it is working "I have not even thought about her back."  Review of Systems  Constitutional: Negative for fever and malaise/fatigue.  HENT: Negative for congestion and hearing loss.   Eyes: Negative for blurred vision, discharge and redness.  Respiratory: Negative for cough and shortness of breath.   Cardiovascular: Negative for chest pain, palpitations and leg swelling.  Gastrointestinal: Negative for abdominal pain and heartburn.  Genitourinary: Negative for dysuria.  Musculoskeletal: Positive for back pain. Negative for falls.  Skin: Negative for rash.  Neurological: Positive for tingling. Negative for loss of consciousness and headaches.  Endo/Heme/Allergies: Does not bruise/bleed easily.  Psychiatric/Behavioral: Negative for depression.  All other systems reviewed and are negative.    Patient Active Problem List   Diagnosis Date Noted  . Renal cyst, right 09/19/2019  . 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/18/2019  . Lumbar radiculopathy 09/09/2019  . Low back pain 05/27/2019  . Closed fracture of coccyx ([place]) 12/04/2018  . Acute appendicitis 09/14/2018  . Well woman exam without gynecological exam 03/20/2018  . Food allergy 03/20/2018  . SOB (shortness of breath) on exertion 03/20/2018  . Genetic testing 12/23/2014  . Family history of breast cancer   . Family history of uterine cancer   . Osteoporosis 10/04/2014  . Pancreatic duct dilated 05/10/2012  . Depression   . FATIQUE AND MALAISE 10/27/2008  . IBS 08/01/2008  . UNS ADVRS EFF UNS RX MEDICINAL&BIOLOGICAL SBSTNC 01/21/2008  . Depression, recurrent ([place]) 01/18/2008  . RHINITIS, CHRONIC 01/18/2008  Social History   Tobacco Use  . Smoking status: Never Smoker  . Smokeless tobacco: Never Used  Substance Use Topics  . Alcohol use: Yes    Alcohol/week: 2.0 standard drinks    Types: 2 Glasses of wine per week    Comment: week     Current Outpatient Medications:  .  albuterol (PROVENTIL HFA;VENTOLIN HFA) 108 (90 Base) MCG/ACT inhaler, Inhale 2 puffs into the lungs every 6 (six) hours as needed for wheezing or shortness of breath., Disp: 1 Inhaler, Rfl: 2 .  APLENZIN 174 MG TB24, Take 1 tablet by mouth daily., Disp: , Rfl: 0 .  Calcium Carbonate (CALCIUM 600 PO), Take 1 tablet by mouth daily., Disp: , Rfl:  .  EPINEPHrine 0.3 mg/0.3 mL IJ SOAJ injection, Inject 0.3 mg into the muscle as needed for anaphylaxis. , Disp: , Rfl: 2 .  Estradiol 10 MCG TABS vaginal tablet, Insert one tablet vaginally qhs x 1 week, then 2 x a week at hs, Disp: 24 tablet, Rfl: 3 .  LORazepam (ATIVAN) 0.5 MG tablet, Take 0.5 mg by mouth daily as needed for sleep. , Disp: , Rfl:  .  MAGNESIUM GLYCINATE PLUS PO, Take 2 capsules by mouth at bedtime., Disp: , Rfl:  .  mometasone (NASONEX) 50 MCG/ACT nasal spray, SHAKE LIQUID AND USE 2 SPRAYS IN EACH NOSTRIL DAILY, Disp: 17 g, Rfl: 5 .  montelukast (SINGULAIR) 10 MG tablet, TAKE 1 TABLET(10 MG) BY MOUTH AT BEDTIME, Disp: 30 tablet, Rfl: 5 .  Multiple Vitamin (MULTIVITAMIN) tablet, Take 1 tablet by mouth 2 (two) times daily. , Disp: , Rfl:  .  olopatadine (PATANOL) 0.1 % ophthalmic solution, Place 1 drop into both eyes 2 (two) times daily., Disp: 5 mL, Rfl: 5 .  Omega-3 Fatty Acids (OMEGAPURE 780 EC PO), Take 1 tablet by mouth daily., Disp: , Rfl:  .  risedronate (ACTONEL) 150 MG tablet, TAKE ONE TABLET(150MG ) BY MOUTH EVERY 30 DAYS WITH WATER ON EMPTY STOMAACH NOTHING BY MOUTH AND DON'T LIE DOWN FOR 30 MINUTES, Disp: 1 tablet, Rfl: 11  Allergies  Allergen Reactions  . Honey Bee Treatment [Bee Venom] Anaphylaxis  . Eggs Or Egg-Derived Products     Stomach ache  . Methocarbamol   . Peanut-Containing Drug Products Other (See Comments)    Allergy testing  . Sesame Seed (Diagnostic) Other (See Comments)    Allergy test  . Soy Allergy Other (See Comments)    Allergy test  . Wheat Bran      Intolerance     Objective:  There were no vitals taken for this visit.  VITALS: Per patient if applicable, see vitals. GENERAL: Alert, appears well and in no acute distress. HEENT: Atraumatic, conjunctiva clear, no obvious abnormalities on inspection of external nose and ears. NECK: Normal movements of the head and neck. CARDIOPULMONARY: No increased WOB. Speaking in clear sentences. I:E ratio WNL.  MS: Moves all visible extremities without noticeable abnormality. PSYCH: Pleasant and cooperative, well-groomed. Speech normal rate and rhythm. Affect is appropriate. Insight and judgement are appropriate. Attention is focused, linear, and appropriate.  NEURO: CN grossly intact. Oriented as arrived to appointment on time with no prompting. Moves both UE equally.  SKIN: No obvious lesions, wounds, erythema, or cyanosis noted on face or hands.  Depression screen [facility] 2/9 04/24/2018  Decreased Interest 0  Down, Depressed, Hopeless 0  PHQ - 2 Score 0  Altered sleeping 1  Tired, decreased energy 0  Change in appetite 0  Feeling bad  or failure about yourself  0  Trouble concentrating 0  Moving slowly or fidgety/restless 0  Suicidal thoughts 0  PHQ-9 Score 1  Difficult doing work/chores Not difficult at all     . COVID-19 Education: The signs and symptoms of COVID-19 were discussed with the patient and how to seek care for testing if needed. The importance of social distancing was discussed today. . Reviewed expectations re: course of current medical issues. . Discussed self-management of symptoms. . Outlined signs and symptoms indicating need for more acute intervention. . Patient verbalized understanding and all questions were answered. Marland Kitchen Health Maintenance issues including appropriate healthy diet, exercise, and smoking avoidance were discussed with patient. . See orders for this visit as documented in the electronic medical record.  Arnette Norris, MD  Records requested if needed. Time  spent: 25 minutes, of which >50% was spent in obtaining information about her symptoms, reviewing her previous labs, evaluations, and treatments, counseling her about her condition (please see the discussed topics above), and developing a plan to further investigate it; she had a number of questions which I addressed.   Lab Results  Component Value Date   WBC 6.1 09/12/2019   HGB 12.8 09/12/2019   HCT 38.5 09/12/2019   PLT 238 09/12/2019   GLUCOSE 78 09/12/2019   CHOL 125 09/12/2019   TRIG 52 09/12/2019   HDL 52 09/12/2019   LDLCALC 61 09/12/2019   ALT 15 09/12/2019   AST 17 09/12/2019   NA 143 09/12/2019   K 3.9 09/12/2019   CL 103 09/12/2019   CREATININE 0.70 09/12/2019   BUN 14 09/12/2019   CO2 24 09/12/2019   TSH 1.31 03/20/2018   HGBA1C 5.1 09/12/2019    Lab Results  Component Value Date   TSH 1.31 03/20/2018   Lab Results  Component Value Date   WBC 6.1 09/12/2019   HGB 12.8 09/12/2019   HCT 38.5 09/12/2019   MCV 85 09/12/2019   PLT 238 09/12/2019   Lab Results  Component Value Date   NA 143 09/12/2019   K 3.9 09/12/2019   CO2 24 09/12/2019   GLUCOSE 78 09/12/2019   BUN 14 09/12/2019   CREATININE 0.70 09/12/2019   BILITOT 0.2 09/12/2019   ALKPHOS 118 (H) 09/12/2019   AST 17 09/12/2019   ALT 15 09/12/2019   PROT 7.0 09/12/2019   ALBUMIN 4.4 09/12/2019   CALCIUM 9.5 09/12/2019   GFR 74.39 09/14/2018   Lab Results  Component Value Date   CHOL 125 09/12/2019   Lab Results  Component Value Date   HDL 52 09/12/2019   Lab Results  Component Value Date   LDLCALC 61 09/12/2019   Lab Results  Component Value Date   TRIG 52 09/12/2019   Lab Results  Component Value Date   CHOLHDL 2.4 09/12/2019   Lab Results  Component Value Date   HGBA1C 5.1 09/12/2019       Assessment & Plan:   Problem List Items Addressed This Visit      Active Problems   Foraminal stenosis of lumbar region    From [facility] dated 08/12/19:  Imaging: MRI scan of  the lumbar spine reviewed in detail with the patient showing multilevel disc bulges resulting in foraminal stenosis at L4-5 and L5-S1.   Left low back pain and radicular symptoms secondary to foraminal stenosis lumbar spine. Recommend L4-5 and L5-S1 SNRB injections from Dr. Nelva Bush. Also recommend Flexeril 10 mg to be taken primarily at night but up  to 3 times a day for muscle spasms and pain. Alternate moist heat and ice. Continue stretching and core strengthening. Follow-up in 4 weeks.    1. Low back pain  . cyclobenzaprine 10 mg tablet   . injection, transforaminal epidural, lumbar (PROC) - left L4-L5 and L5-S1 by Dr. Nelva Bush   2. Degenerative lumbar spinal stenosis  3. Degeneration of lumbar intervertebral disc    Received first injection yesterday with a lot of relief.      Renal cyst, right   Relevant Orders   US Renal      I am having Texanna C. Rotter maintain her multivitamin, LORazepam, Aplenzin, MAGNESIUM GLYCINATE PLUS PO, Omega-3 Fatty Acids (OMEGAPURE 780 EC PO), Calcium Carbonate (CALCIUM 600 PO), EPINEPHrine, albuterol, risedronate, olopatadine, mometasone, montelukast, and Estradiol.  No orders of the defined types were placed in this encounter.    Arnette Norris, MD

## 2019-09-18 DIAGNOSIS — M48061 Spinal stenosis, lumbar region without neurogenic claudication: Secondary | ICD-10-CM | POA: Insufficient documentation

## 2019-09-19 ENCOUNTER — Telehealth (INDEPENDENT_AMBULATORY_CARE_PROVIDER_SITE_OTHER): Payer: Managed Care, Other (non HMO) | Admitting: Family Medicine

## 2019-09-19 DIAGNOSIS — N281 Cyst of kidney, acquired: Secondary | ICD-10-CM | POA: Insufficient documentation

## 2019-09-19 DIAGNOSIS — M48061 Spinal stenosis, lumbar region without neurogenic claudication: Secondary | ICD-10-CM | POA: Diagnosis not present

## 2019-09-19 NOTE — Assessment & Plan Note (Signed)
From [facility] dated 08/12/19:  Imaging: MRI scan of the lumbar spine reviewed in detail with the patient showing multilevel disc bulges resulting in foraminal stenosis at L4-5 and L5-S1.   Left low back pain and radicular symptoms secondary to foraminal stenosis lumbar spine. Recommend L4-5 and L5-S1 SNRB injections from Dr. Nelva Bush. Also recommend Flexeril 10 mg to be taken primarily at night but up to 3 times a day for muscle spasms and pain. Alternate moist heat and ice. Continue stretching and core strengthening. Follow-up in 4 weeks.    1. Low back pain  . cyclobenzaprine 10 mg tablet   . injection, transforaminal epidural, lumbar (PROC) - left L4-L5 and L5-S1 by Dr. Nelva Bush   2. Degenerative lumbar spinal stenosis  3. Degeneration of lumbar intervertebral disc    Received first injection yesterday with a lot of relief.

## 2019-09-20 LAB — TESTT+TESTF+SHBG
Sex Hormone Binding: 103 nmol/L (ref 17.3–125.0)
Testosterone, Free: 1.1 pg/mL (ref 0.0–4.2)
Testosterone, Total, LC/MS: 11.7 ng/dL

## 2019-09-20 LAB — COMPREHENSIVE METABOLIC PANEL
ALT: 15 IU/L (ref 0–32)
AST: 17 IU/L (ref 0–40)
Albumin/Globulin Ratio: 1.7 (ref 1.2–2.2)
Albumin: 4.4 g/dL (ref 3.8–4.9)
Alkaline Phosphatase: 118 IU/L — ABNORMAL HIGH (ref 39–117)
BUN/Creatinine Ratio: 20 (ref 9–23)
BUN: 14 mg/dL (ref 6–24)
Bilirubin Total: 0.2 mg/dL (ref 0.0–1.2)
CO2: 24 mmol/L (ref 20–29)
Calcium: 9.5 mg/dL (ref 8.7–10.2)
Chloride: 103 mmol/L (ref 96–106)
Creatinine, Ser: 0.7 mg/dL (ref 0.57–1.00)
GFR calc Af Amer: 112 mL/min/{1.73_m2} (ref >59)
GFR calc non Af Amer: 97 mL/min/{1.73_m2} (ref >59)
Globulin, Total: 2.6 g/dL (ref 1.5–4.5)
Glucose: 78 mg/dL (ref 65–99)
Potassium: 3.9 mmol/L (ref 3.5–5.2)
Sodium: 143 mmol/L (ref 134–144)
Total Protein: 7 g/dL (ref 6.0–8.5)

## 2019-09-20 LAB — LIPID PANEL
Chol/HDL Ratio: 2.4 ratio (ref 0.0–4.4)
Cholesterol, Total: 125 mg/dL (ref 100–199)
HDL: 52 mg/dL (ref >39)
LDL Chol Calc (NIH): 61 mg/dL (ref 0–99)
Triglycerides: 52 mg/dL (ref 0–149)
VLDL Cholesterol Cal: 12 mg/dL (ref 5–40)

## 2019-09-20 LAB — CBC
Hematocrit: 38.5 % (ref 34.0–46.6)
Hemoglobin: 12.8 g/dL (ref 11.1–15.9)
MCH: 28.2 pg (ref 26.6–33.0)
MCHC: 33.2 g/dL (ref 31.5–35.7)
MCV: 85 fL (ref 79–97)
Platelets: 238 10*3/uL (ref 150–450)
RBC: 4.54 x10E6/uL (ref 3.77–5.28)
RDW: 13.2 % (ref 11.7–15.4)
WBC: 6.1 10*3/uL (ref 3.4–10.8)

## 2019-09-20 LAB — VITAMIN D 25 HYDROXY (VIT D DEFICIENCY, FRACTURES): Vit D, 25-Hydroxy: 60.2 ng/mL (ref 30.0–100.0)

## 2019-09-20 LAB — HEMOGLOBIN A1C
Est. average glucose Bld gHb Est-mCnc: 100 mg/dL
Hgb A1c MFr Bld: 5.1 % (ref 4.8–5.6)

## 2019-09-23 ENCOUNTER — Encounter: Payer: Self-pay | Admitting: Obstetrics and Gynecology

## 2019-09-23 ENCOUNTER — Telehealth: Payer: Self-pay | Admitting: Obstetrics and Gynecology

## 2019-09-23 DIAGNOSIS — R6882 Decreased libido: Secondary | ICD-10-CM

## 2019-09-23 NOTE — Telephone Encounter (Signed)
Visit Follow-Up Question Received: Today Message Contents  Avariella, [place] sent to [place]  Phone Number: [phone]  Hi Dr. Talbert Nan,  Is there a different Estrogen that I can use? I am not having any problems with the Estradiol you prescribed, but its costing me $250 (with insurance). Just wondering if there is a generic or another option.   Thanks,  [employer]

## 2019-09-23 NOTE — Telephone Encounter (Signed)
Left message to call Michelle Vanhise, RN at [facility] [phone].   

## 2019-09-23 NOTE — Telephone Encounter (Signed)
Spoke with patient. Patient will check with insurance plan to review covered alternative, will return call to advise what is covered on formulary. Will then review options with Dr. Talbert Nan. Questions answered.

## 2019-09-24 NOTE — Telephone Encounter (Signed)
Burnice Logan, RN  09/24/2019 9:31 AM EST    Left message to call [name], RN at [place].   See telephone encounter dated 09/23/19

## 2019-09-24 NOTE — Telephone Encounter (Signed)
-----   Message from Salvadore Dom, MD sent at 09/23/2019 12:39 PM EST ----- Please let the patient know that her testosterone levels are low normal. I think she would be a candidate for topical testosterone if she wants to try it. I told her it wasn't FDA approved and reviewed risks with her at her annual exam. If she wants to start it, please call in 1% testosterone cream to the compounding pharmacy. She should use 0.5 grams daily (apply to lower abdomen or to her thighs). Call in 30 grams, she should have a testosterone panel in one month.

## 2019-09-25 MED ORDER — NONFORMULARY OR COMPOUNDED ITEM: 0 refills | Status: DC

## 2019-09-25 NOTE — Telephone Encounter (Signed)
Patient returning call to [facility]

## 2019-09-25 NOTE — Telephone Encounter (Signed)
Spoke with patient, advised per Dr. Talbert Nan. Reviewed risk of testosterone, questions answered. Patient request Rx for testosterone to [facility]. Patient would like to check on cost before deciding if she will start Rx. Patient will return call to office to notify and schedule 1 mo f/u labs if she starts medication.   Patient will also return call at later date to review alternative estradiol Rx after she speaks with her insurance provider for alternatives. Patient verbalizes understanding and is agreeable.     Future lab order placed for TestT +TestF+SHBG.  Rx for testosterone to [facility] to provider for final review. Patient is agreeable to disposition. Will close encounter.

## 2019-09-30 ENCOUNTER — Encounter: Payer: Self-pay | Admitting: Obstetrics and Gynecology

## 2019-09-30 ENCOUNTER — Telehealth: Payer: Self-pay | Admitting: Obstetrics and Gynecology

## 2019-09-30 NOTE — Telephone Encounter (Signed)
Reviewed 09/12/19 Testosterone results and recommendations.   Testosterone cream should be applied daily.   MyChart message to patient.   Routing to Dr. Rosann Auerbach.   Encounter closed.

## 2019-09-30 NOTE — Telephone Encounter (Signed)
Patient sent the following message through [place]. Routing to triage to assist patient with request.  [place], [place] [facility]  Phone Number: [phone]  The instructions on my testosterone prescription tell me how much to use but doesn't say how often to use it or if there is a time of day that is best. Please advise.   Blanca Friend

## 2019-10-01 ENCOUNTER — Ambulatory Visit
Admission: RE | Admit: 2019-10-01 | Discharge: 2019-10-01 | Disposition: A | Discharge status: Home / Self Care | Payer: Managed Care, Other (non HMO) | Source: Ambulatory Visit | Attending: Family Medicine | Admitting: Family Medicine

## 2019-10-01 ENCOUNTER — Ambulatory Visit (INDEPENDENT_AMBULATORY_CARE_PROVIDER_SITE_OTHER): Payer: Managed Care, Other (non HMO)

## 2019-10-01 DIAGNOSIS — J309 Allergic rhinitis, unspecified: Secondary | ICD-10-CM

## 2019-10-01 DIAGNOSIS — N281 Cyst of kidney, acquired: Secondary | ICD-10-CM

## 2019-10-08 ENCOUNTER — Ambulatory Visit (INDEPENDENT_AMBULATORY_CARE_PROVIDER_SITE_OTHER): Payer: Managed Care, Other (non HMO)

## 2019-10-08 DIAGNOSIS — J309 Allergic rhinitis, unspecified: Secondary | ICD-10-CM | POA: Diagnosis not present

## 2019-10-10 ENCOUNTER — Encounter: Payer: Self-pay | Admitting: Nurse Practitioner

## 2019-10-10 ENCOUNTER — Encounter: Payer: Self-pay | Admitting: Obstetrics and Gynecology

## 2019-10-10 ENCOUNTER — Telehealth (INDEPENDENT_AMBULATORY_CARE_PROVIDER_SITE_OTHER): Payer: Managed Care, Other (non HMO) | Admitting: Nurse Practitioner

## 2019-10-10 DIAGNOSIS — N281 Cyst of kidney, acquired: Secondary | ICD-10-CM

## 2019-10-10 NOTE — Progress Notes (Signed)
Virtual Visit via Video Note  I connected with@ on 10/10/19 at  9:30 AM EST by a video enabled telemedicine application and verified that I am speaking with the correct person using two identifiers.  Location: Patient:Home Provider: Office Participants: patient and provider   I discussed the limitations of evaluation and management by telemedicine and the availability of in person appointments. I also discussed with the patient that there may be a patient responsible charge related to this service. The patient expressed understanding and agreed to proceed.  [id number] [place] result  History of Present Illness: Ms. Radich is here to discuss renal US results. She states Dr. Lynnette Caffey (with [facility]) noted a cyst on MRI lumber spine in 07/2019. She was advised to f/up with pcp. She discussed these findings with Dr. Deborra Medina who was her pcp at the time. Renal US was ordered and completed.  I did not have access to MRI lumber spine report to compare. She denies any flank pain/hematuria/fever/ABD pain. She has no hx of kidney disease. Last CMP completed 09/12/2019 (normal renal function)  Observations/Objective: Physical Exam  Constitutional: She is oriented to person, place, and time. No distress.  Pulmonary/Chest: Effort normal.  Neurological: She is alert and oriented to person, place, and time.   Assessment and Plan: Shreya was seen today for follow-up.  Diagnoses and all orders for this visit:  Renal cyst, right    Follow Up Instructions: I spent 27mins discussing US findings and reviewing her chart. We also discussed the significance of renal cyst and need for additional evaluation. Renal cyst appears small and benign. No additional evaluation needed at this time.  If she develops fever of unknown origin, hematuria and/or flank pain; we will repeat renal US and perform urinalysis. She verbalized understanding   I discussed the assessment and treatment plan with the patient. The patient  was provided an opportunity to ask questions and all were answered. The patient agreed with the plan and demonstrated an understanding of the instructions.   The patient was advised to call back or seek an in-person evaluation if the symptoms worsen or if the condition fails to improve as anticipated.  Wilfred Lacy, NP

## 2019-10-10 NOTE — Patient Instructions (Addendum)
Renal cyst appears small and benign. No additional evaluation needed at this time.  If she develops fever of unknown origin, hematuria and/or flank pain; we will repeat renal US and perform urinalysis. She verbalized understanding

## 2019-10-10 NOTE — Assessment & Plan Note (Signed)
1cm anechoic cyst per [place] completed 10/01/2019. Asymptomatic, normal renal function. No additional work up needed at this time.

## 2019-10-15 NOTE — Progress Notes (Signed)
Vials exp 10-14-20

## 2019-10-16 DIAGNOSIS — J301 Allergic rhinitis due to pollen: Secondary | ICD-10-CM

## 2019-10-21 ENCOUNTER — Other Ambulatory Visit: Payer: Self-pay | Admitting: *Deleted

## 2019-10-21 ENCOUNTER — Ambulatory Visit (INDEPENDENT_AMBULATORY_CARE_PROVIDER_SITE_OTHER): Payer: Managed Care, Other (non HMO)

## 2019-10-21 DIAGNOSIS — J309 Allergic rhinitis, unspecified: Secondary | ICD-10-CM | POA: Diagnosis not present

## 2019-10-25 ENCOUNTER — Other Ambulatory Visit: Payer: Self-pay

## 2019-10-28 ENCOUNTER — Other Ambulatory Visit: Payer: Self-pay

## 2019-10-28 ENCOUNTER — Ambulatory Visit (INDEPENDENT_AMBULATORY_CARE_PROVIDER_SITE_OTHER): Payer: Managed Care, Other (non HMO) | Admitting: Obstetrics and Gynecology

## 2019-10-28 ENCOUNTER — Encounter: Payer: Self-pay | Admitting: Obstetrics and Gynecology

## 2019-10-28 VITALS — BP 116/68 | HR 81 | Temp 98.2°F | Ht 61.0 in | Wt 141.6 lb

## 2019-10-28 DIAGNOSIS — R6882 Decreased libido: Secondary | ICD-10-CM

## 2019-10-28 DIAGNOSIS — N952 Postmenopausal atrophic vaginitis: Secondary | ICD-10-CM | POA: Diagnosis not present

## 2019-10-28 NOTE — Telephone Encounter (Signed)
See telephone encounter dated 10/13/19

## 2019-10-28 NOTE — Progress Notes (Signed)
GYNECOLOGY  VISIT   HPI: 59 y.o.   Married White or Caucasian Hispanic or Latino  female   [phone] with No LMP recorded. Patient has had a hysterectomy.   here for follow up after starting Vaginal estrogen and testosterone Cream. Patient states that she feels that the dryness is better. She says that she thinks she has a little more of a sex drive.   At the time of her annual exam in 1/21 she c/o dyspareunia even with a lubricant. Since starting the vaginal estrogen, she hasn't been sexually active yet (house renovations have them sleeping in different rooms). Her vagina is feeling better over all. She is noticing some libido now, prior to the testosterone there was none.  She is feeling more herself, well rested.    GYNECOLOGIC HISTORY: No LMP recorded. Patient has had a hysterectomy. Contraception Hysteroctomy  Menopausal hormone therapy: Vaginal Estrogen        OB History    Gravida  2   Para  2   Term  2   Preterm      AB      Living  2     SAB      TAB      Ectopic      Multiple      Live Births  2              Patient Active Problem List   Diagnosis Date Noted  . Renal cyst, right 09/19/2019  . 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/18/2019  . Lumbar radiculopathy 09/09/2019  . Low back pain 05/27/2019  . Closed fracture of coccyx ([place]) 12/04/2018  . Acute appendicitis 09/14/2018  . Well woman exam without gynecological exam 03/20/2018  . Food allergy 03/20/2018  . SOB (shortness of breath) on exertion 03/20/2018  . Genetic testing 12/23/2014  . Family history of breast cancer   . Family history of uterine cancer   . Osteoporosis 10/04/2014  . Pancreatic duct dilated 05/10/2012  . Depression   . FATIQUE AND MALAISE 10/27/2008  . IBS 08/01/2008  . UNS ADVRS EFF UNS RX MEDICINAL&BIOLOGICAL SBSTNC 01/21/2008  . Depression, recurrent ([place]) 01/18/2008  . RHINITIS, CHRONIC 01/18/2008    Past Medical History:  Diagnosis Date  . Allergy   . Anemia    . Anxiety   . BRCA negative 12/2014  . Depression   . Endometriosis   . Esophagitis   . Fibroid   . GERD (gastroesophageal reflux disease)   . Hiatal hernia   . History of diverticulitis   . IC (interstitial cystitis)   . Osteoporosis 01/2019   2018 T score -2.6, 2020 T score -2.0 on Actonel  . Schatzki's ring     Past Surgical History:  Procedure Laterality Date  . CHOLECYSTECTOMY    . HYSTEROSCOPY  12/2003   RESECTON OF SUBMUCOUS  MYOMA/DX SCOPE  . LAPAROSCOPIC APPENDECTOMY N/A 09/14/2018   Procedure: APPENDECTOMY LAPAROSCOPIC;  Surgeon: Ileana Roup, MD;  Location: [facility];  Service: General;  Laterality: N/A;  . LAPAROSCOPIC BILATERAL SALPINGO OOPHERECTOMY Bilateral 07/02/2014   Procedure: LAPAROSCOPIC BILATERAL SALPINGO OOPHORECTOMY;  Surgeon: Anastasio Auerbach, MD;  Location: [facility];  Service: Gynecology;  Laterality: Bilateral;  . PELVIC LAPAROSCOPY  12/2003   DX SCOPE/RESECTION OF SUBMUCOUS MYOMA  . TMJ ARTHROSCOPY  11-26-2001  . VAGINAL HYSTERECTOMY  12/05   Menorrhagia/dysmenorrhea. Pathology showed leiomyoma/adenomyosis    Current Outpatient Medications  Medication Sig Dispense Refill  . albuterol (PROVENTIL HFA;VENTOLIN HFA) 108 (  90 Base) MCG/ACT inhaler Inhale 2 puffs into the lungs every 6 (six) hours as needed for wheezing or shortness of breath. 1 Inhaler 2  . APLENZIN 174 MG TB24 Take 1 tablet by mouth daily.  0  . Calcium Carbonate (CALCIUM 600 PO) Take 1 tablet by mouth daily.    Marland Kitchen EPINEPHrine 0.3 mg/0.3 mL IJ SOAJ injection Inject 0.3 mg into the muscle as needed for anaphylaxis.   2  . Estradiol 10 MCG TABS vaginal tablet Insert one tablet vaginally qhs x 1 week, then 2 x a week at hs 24 tablet 3  . LORazepam (ATIVAN) 0.5 MG tablet Take 0.5 mg by mouth daily as needed for sleep.     Marland Kitchen MAGNESIUM GLYCINATE PLUS PO Take 2 capsules by mouth at bedtime.    . mometasone (NASONEX) 50 MCG/ACT nasal spray SHAKE LIQUID AND USE 2 SPRAYS IN EACH NOSTRIL DAILY  17 g 5  . montelukast (SINGULAIR) 10 MG tablet TAKE 1 TABLET(10 MG) BY MOUTH AT BEDTIME 30 tablet 5  . Multiple Vitamin (MULTIVITAMIN) tablet Take 1 tablet by mouth 2 (two) times daily.     . NONFORMULARY OR COMPOUNDED ITEM Compounded 1% Testosterone cream. Apply 0.5 grams daily to lower abdomen or thighs.  Dispense 30 grams.  No refills. 1 each 0  . olopatadine (PATANOL) 0.1 % ophthalmic solution Place 1 drop into both eyes 2 (two) times daily. 5 mL 5  . Omega-3 Fatty Acids (OMEGAPURE 780 EC PO) Take 1 tablet by mouth daily.    . risedronate (ACTONEL) 150 MG tablet TAKE ONE TABLET(150MG) BY MOUTH EVERY 30 DAYS WITH WATER ON EMPTY STOMAACH NOTHING BY MOUTH AND DON'T LIE DOWN FOR 30 MINUTES 1 tablet 11   No current facility-administered medications for this visit.     ALLERGIES: Honey bee treatment [bee venom], Eggs or egg-derived products, Methocarbamol, Peanut-containing drug products, Sesame seed (diagnostic), Soy allergy, and Wheat bran  Family History  Problem Relation Age of Onset  . Uterine cancer Maternal Grandmother        dx in her 75s  . Breast cancer Sister        paternal half sister dx in her early 45s  . Cystic fibrosis Other        brother's granddauther  . Emphysema Father   . Early death Father   . Breast cancer Sister        paternal half sister dx in her late 83s  . Cystic fibrosis Other        brothers granddaughters  . Depression Mother   . Hyperlipidemia Mother   . Hypertension Mother   . Mental illness Mother   . Miscarriages / [place] Mother   . Diabetes Brother 50       HALF BROTHER/HALF BROTHER   . Breast cancer Sister   . GI problems Brother   . Colon cancer Neg Hx   . Esophageal cancer Neg Hx   . Rectal cancer Neg Hx   . Stomach cancer Neg Hx   . Allergic rhinitis Neg Hx   . Asthma Neg Hx   . Eczema Neg Hx   . Urticaria Neg Hx     Social History   Socioeconomic History  . Marital status: Married    Spouse name: Not on file  . Number  of children: 2  . Years of education: Not on file  . Highest education level: Not on file  Occupational History  . Not on file  Tobacco Use  .  Smoking status: Never Smoker  . Smokeless tobacco: Never Used  Substance and Sexual Activity  . Alcohol use: Yes    Alcohol/week: 2.0 standard drinks    Types: 2 Glasses of wine per week    Comment: week  . Drug use: No  . Sexual activity: Yes    Birth control/protection: Surgical    Comment: HYST-1st intercourse 59 yo-Fewer than 5 partners  Other Topics Concern  . Not on file  Social History Narrative  . Not on file   Social Determinants of Health   Financial Resource Strain:   . Difficulty of Paying Living Expenses: Not on file  Food Insecurity:   . Worried About Charity fundraiser in the Last Year: Not on file  . Ran Out of Food in the Last Year: Not on file  Transportation Needs:   . Lack of Transportation (Medical): Not on file  . Lack of Transportation (Non-Medical): Not on file  Physical Activity:   . Days of Exercise per Week: Not on file  . Minutes of Exercise per Session: Not on file  Stress:   . Feeling of Stress : Not on file  Social Connections:   . Frequency of Communication with Friends and Family: Not on file  . Frequency of Social Gatherings with Friends and Family: Not on file  . Attends Religious Services: Not on file  . Active Member of Clubs or Organizations: Not on file  . Attends Archivist Meetings: Not on file  . Marital Status: Not on file  Intimate Partner Violence:   . Fear of Current or Ex-Partner: Not on file  . Emotionally Abused: Not on file  . Physically Abused: Not on file  . Sexually Abused: Not on file    Review of Systems  All other systems reviewed and are negative.   PHYSICAL EXAMINATION:    There were no vitals taken for this visit.    General appearance: alert, cooperative and appears stated age   Pelvic: External genitalia:  no lesions              Urethra:   normal appearing urethra with no masses, tenderness or lesions              Bartholins and Skenes: normal                 Vagina: normal appearing vagina with very mild atrophy. Able to insert 2 fingers vaginally without discomfort.              Cervix: absent              Bimanual Exam:  Uterus:  uterus absent              Adnexa: no mass, fullness, tenderness               Chaperone was present for exam.  ASSESSMENT Vaginal atrophy, helped with vaginal estrogen Low libido and low normal testosterone levels. She has noticed a slight increase in libido since starting the topical testosterone    PLAN Continue vaginal estrogen Will check testosterone panel today and make adjustments in testosterone cream as needed Routine f/u   An After Visit Summary was printed and given to the patient.

## 2019-11-01 ENCOUNTER — Encounter: Payer: Self-pay | Admitting: Obstetrics and Gynecology

## 2019-11-01 ENCOUNTER — Telehealth: Payer: Self-pay | Admitting: Obstetrics and Gynecology

## 2019-11-01 DIAGNOSIS — R6882 Decreased libido: Secondary | ICD-10-CM

## 2019-11-01 NOTE — Telephone Encounter (Signed)
Per review of 10/28/19 testosterone labs total testosterone pending.   Routing to Dr. Talbert Nan to advise once completed.

## 2019-11-01 NOTE — Telephone Encounter (Signed)
Patient sent the following correspondence through [place].  I have not seen my testosterone lab results show up in My Chart yet, but will need to get a refill on the testosterone cream by next week if Dr. Talbert Nan wants me to continue using that.  The pharmacy cautioned me against waiting to the last minute for refills on compounded prescriptions as they don't do them every day. Can she go ahead and send that refill in?  Thanks, [employer]

## 2019-11-03 LAB — TESTT+TESTF+SHBG
Sex Hormone Binding: 120 nmol/L (ref 17.3–125.0)
Testosterone, Free: 2 pg/mL (ref 0.0–4.2)
Testosterone, Total, LC/MS: 74.7 ng/dL

## 2019-11-04 MED ORDER — NONFORMULARY OR COMPOUNDED ITEM: 0 refills | Status: DC

## 2019-11-04 NOTE — Telephone Encounter (Signed)
Rx faxed to [facility].   Patient notified by MyChart message.

## 2019-11-04 NOTE — Telephone Encounter (Signed)
-----   Message from Salvadore Dom, MD sent at 11/03/2019  2:31 PM EDT ----- Please let the patient know that her total testosterone level is too high. Please cut her current dose in 1/2 and recheck her testosterone levels in 1 month

## 2019-11-04 NOTE — Telephone Encounter (Signed)
Spoke with patient, advised as seen below per Dr. Talbert Nan. Patient currently uses compounded testosterone cream 0.5 mg topically daily, will reduce to 0.25 mg daily.    Lab scheduled for 4/15 at 9:15am. Future lab order placed.   Patient request refill to [facility], Rx pended for Dr. Talbert Nan to review.   Dr. Talbert Nan -please review pended RX.

## 2019-11-05 ENCOUNTER — Ambulatory Visit (INDEPENDENT_AMBULATORY_CARE_PROVIDER_SITE_OTHER): Payer: Managed Care, Other (non HMO)

## 2019-11-05 DIAGNOSIS — J309 Allergic rhinitis, unspecified: Secondary | ICD-10-CM | POA: Diagnosis not present

## 2019-11-12 ENCOUNTER — Ambulatory Visit: Payer: Managed Care, Other (non HMO) | Admitting: Allergy and Immunology

## 2019-11-19 ENCOUNTER — Ambulatory Visit (INDEPENDENT_AMBULATORY_CARE_PROVIDER_SITE_OTHER): Payer: Managed Care, Other (non HMO)

## 2019-11-19 DIAGNOSIS — J309 Allergic rhinitis, unspecified: Secondary | ICD-10-CM

## 2019-11-25 ENCOUNTER — Other Ambulatory Visit: Payer: Self-pay | Admitting: Allergy and Immunology

## 2019-11-26 ENCOUNTER — Other Ambulatory Visit: Payer: Self-pay

## 2019-11-26 ENCOUNTER — Ambulatory Visit (INDEPENDENT_AMBULATORY_CARE_PROVIDER_SITE_OTHER): Payer: Managed Care, Other (non HMO) | Admitting: Allergy and Immunology

## 2019-11-26 ENCOUNTER — Encounter: Payer: Self-pay | Admitting: Allergy and Immunology

## 2019-11-26 VITALS — BP 112/70 | HR 69 | Temp 97.8°F | Resp 16 | Ht 61.0 in | Wt 137.2 lb

## 2019-11-26 DIAGNOSIS — J301 Allergic rhinitis due to pollen: Secondary | ICD-10-CM | POA: Diagnosis not present

## 2019-11-26 DIAGNOSIS — T781XXD Other adverse food reactions, not elsewhere classified, subsequent encounter: Secondary | ICD-10-CM | POA: Diagnosis not present

## 2019-11-26 DIAGNOSIS — J4520 Mild intermittent asthma, uncomplicated: Secondary | ICD-10-CM | POA: Diagnosis not present

## 2019-11-26 DIAGNOSIS — J3089 Other allergic rhinitis: Secondary | ICD-10-CM | POA: Diagnosis not present

## 2019-11-26 DIAGNOSIS — T7800XD Anaphylactic reaction due to unspecified food, subsequent encounter: Secondary | ICD-10-CM

## 2019-11-26 DIAGNOSIS — L989 Disorder of the skin and subcutaneous tissue, unspecified: Secondary | ICD-10-CM

## 2019-11-26 DIAGNOSIS — L308 Other specified dermatitis: Secondary | ICD-10-CM

## 2019-11-26 NOTE — Patient Instructions (Signed)
  1.  Continue to perform Allergen avoidance measures as best as possible  2.  Continue immunotherapy.     3. Continue Nasonex 1-2 sprays each nostril 1-2 times per day during periods of upper airway symptoms  4.  If needed:   A.  Pro-air HFA or similar 2 inhalations every 4-6 hours.  B.  Patanol 1 drop each eye twice a day  C.  Triamcinolone cream  D.   EpiPen Cammie Sickle, Benadryl, MD/ER for severe allergic reaction  E.  Antihistamine  5.  Return to clinic in 12 months or earlier if problem

## 2019-11-26 NOTE — Progress Notes (Signed)
[facility] [place] [facility] [place]   Follow-up Note  Referring Provider: Lucille Passy, MD Primary Provider: Lucille Passy, MD Date of Office Visit: 11/26/2019  Subjective:   Yolanda [name] (DOB: 1960-10-29) is a 59 y.o. female who returns to the [facility] on 11/26/2019 in re-evaluation of the following:  HPI: Yolanda [name] returns to this clinic in evaluation of her intermittent asthma and allergic rhinitis and oral allergy syndrome and food allergy and a history of inflammatory dermatosis.  Her last visit to this clinic was 07 February 2019.  While undergoing a course of immunotherapy, currently at every 2 weeks without any adverse effect, she has noticed dramatic improvement regarding all of her airway issues and her tolerance to consuming specific foods.  She continues to use a nasal steroid during the spring and fall and will also add in some symptomatic medications when needed but for the most part has really done well over the course of the past 6 months without the need for systemic steroid or antibiotic and she is very pleased with the response she has receiving with immunotherapy.  Basically her asthma has melted away for she can exercise without any problem and does not use a short acting bronchodilator.  And she has not used any topical triamcinolone in almost a year.  She can now can consume blueberries and raw carrots and celery and cucumbers and tomatoes and eggs but still has some problems with asparagus and banana and she has not tried any apples.  She will be receiving her Covid vaccination tomorrow.  Allergies as of 11/26/2019      Reactions   Honey Bee Treatment [bee Venom] Anaphylaxis   Eggs Or Egg-derived Products    Stomach ache   Methocarbamol    Peanut-containing Drug Products Other (See Comments)   Allergy testing   Sesame Seed (diagnostic) Other (See Comments)   Allergy test   Soy Allergy Other (See Comments)   Allergy test   Wheat Bran    Intolerance       Medication List      albuterol 108 (90 Base) MCG/ACT inhaler Commonly known as: VENTOLIN HFA Inhale 2 puffs into the lungs every 6 (six) hours as needed for wheezing or shortness of breath.   Aplenzin 174 MG Tb24 Generic drug: BuPROPion HBr Take 1 tablet by mouth daily.   CALCIUM 600 PO Take 1 tablet by mouth daily.   EPINEPHrine 0.3 mg/0.3 mL Soaj injection Commonly known as: EPI-PEN Inject 0.3 mg into the muscle as needed for anaphylaxis.   Estradiol 10 MCG Tabs vaginal tablet Insert one tablet vaginally qhs x 1 week, then 2 x a week at hs   LORazepam 0.5 MG tablet Commonly known as: ATIVAN Take 0.5 mg by mouth daily as needed for sleep.   MAGNESIUM GLYCINATE PLUS PO Take 2 capsules by mouth at bedtime.   mometasone 50 MCG/ACT nasal spray Commonly known as: NASONEX SHAKE LIQUID AND USE 2 SPRAYS IN EACH NOSTRIL DAILY   montelukast 10 MG tablet Commonly known as: SINGULAIR TAKE 1 TABLET(10 MG) BY MOUTH AT BEDTIME   multivitamin tablet Take 1 tablet by mouth 2 (two) times daily.   NONFORMULARY OR COMPOUNDED ITEM Compounded 1% Testosterone cream. Apply 0.25 grams daily to lower abdomen or thighs.  Dispense 7.5 grams.  No refills.   olopatadine 0.1 % ophthalmic solution Commonly known as: PATANOL INSTILL 1 DROP IN BOTH EYES TWICE DAILY   OMEGAPURE 780  EC PO Take 1 tablet by mouth daily.   risedronate 150 MG tablet Commonly known as: ACTONEL TAKE ONE TABLET(150MG) BY MOUTH EVERY 30 DAYS WITH WATER ON EMPTY STOMAACH NOTHING BY MOUTH AND DON'T LIE DOWN FOR 30 MINUTES       Past Medical History:  Diagnosis Date  . Allergy   . Anemia   . Anxiety   . BRCA negative 12/2014  . Depression   . Endometriosis   . Esophagitis   . Fibroid   . GERD (gastroesophageal reflux disease)   . Hiatal hernia   . History of diverticulitis   . IC (interstitial cystitis)   . Osteoporosis 01/2019   2018 T score -2.6, 2020 T score -2.0 on  Actonel  . Schatzki's ring     Past Surgical History:  Procedure Laterality Date  . CHOLECYSTECTOMY    . HYSTEROSCOPY  12/2003   RESECTON OF SUBMUCOUS  MYOMA/DX SCOPE  . LAPAROSCOPIC APPENDECTOMY N/A 09/14/2018   Procedure: APPENDECTOMY LAPAROSCOPIC;  Surgeon: Ileana Roup, MD;  Location: [facility];  Service: General;  Laterality: N/A;  . LAPAROSCOPIC BILATERAL SALPINGO OOPHERECTOMY Bilateral 07/02/2014   Procedure: LAPAROSCOPIC BILATERAL SALPINGO OOPHORECTOMY;  Surgeon: Anastasio Auerbach, MD;  Location: [facility];  Service: Gynecology;  Laterality: Bilateral;  . PELVIC LAPAROSCOPY  12/2003   DX SCOPE/RESECTION OF SUBMUCOUS MYOMA  . TMJ ARTHROSCOPY  11-26-2001  . VAGINAL HYSTERECTOMY  12/05   Menorrhagia/dysmenorrhea. Pathology showed leiomyoma/adenomyosis    Review of systems negative except as noted in HPI / PMHx or noted below:  Review of Systems  Constitutional: Negative.   HENT: Negative.   Eyes: Negative.   Respiratory: Negative.   Cardiovascular: Negative.   Gastrointestinal: Negative.   Genitourinary: Negative.   Musculoskeletal: Negative.   Skin: Negative.   Neurological: Negative.   Endo/Heme/Allergies: Negative.   Psychiatric/Behavioral: Negative.      Objective:   Vitals:   11/26/19 1620  BP: 112/70  Pulse: 69  Resp: 16  Temp: 97.8 F (36.6 C)  SpO2: 98%   Height: 5' 1"  (154.9 cm)  Weight: 137 lb 3.2 oz (62.2 kg)   Physical Exam Constitutional:      Appearance: She is not diaphoretic.  HENT:     Head: Normocephalic.     Right Ear: Tympanic membrane, ear canal and external ear normal.     Left Ear: Tympanic membrane, ear canal and external ear normal.     Nose: Nose normal. No mucosal edema or rhinorrhea.     Mouth/Throat:     Pharynx: Uvula midline. No oropharyngeal exudate.  Eyes:     Conjunctiva/sclera: Conjunctivae normal.  Neck:     Thyroid: No thyromegaly.     Trachea: Trachea normal. No tracheal tenderness or tracheal deviation.    Cardiovascular:     Rate and Rhythm: Normal rate and regular rhythm.     Heart sounds: Normal heart sounds, S1 normal and S2 normal. No murmur.  Pulmonary:     Effort: No respiratory distress.     Breath sounds: Normal breath sounds. No stridor. No wheezing or rales.  Lymphadenopathy:     Head:     Right side of head: No tonsillar adenopathy.     Left side of head: No tonsillar adenopathy.     Cervical: No cervical adenopathy.  Skin:    Findings: No erythema or rash.     Nails: There is no clubbing.  Neurological:     Mental Status: She is alert.  Diagnostics:    Spirometry was performed and demonstrated an FEV1 of 2.17 at 91 % of predicted.  The patient had an Asthma Control Test with the following results: ACT Total Score: 25.    Assessment and Plan:   1. Perennial allergic rhinitis   2. Asthma, mild intermittent, well-controlled   3. Seasonal allergic rhinitis due to pollen   4. Oral allergy syndrome, subsequent encounter   5. Anaphylactic shock due to food, subsequent encounter   6. Inflammatory dermatosis      1.  Continue to perform Allergen avoidance measures as best as possible  2.  Continue immunotherapy.     3. Continue Nasonex 1-2 sprays each nostril 1-2 times per day during periods of upper airway symptoms  4.  If needed:   A.  Pro-air HFA or similar 2 inhalations every 4-6 hours.  B.  Patanol 1 drop each eye twice a day  C.  Triamcinolone cream  D.   EpiPen Cammie Sickle, Benadryl, MD/ER for severe allergic reaction  E.   Antihistamine  5.  Return to clinic in 12 months or earlier if problem  Overall Yolanda [name] has really done very well with her current therapy which includes immunotherapy.  She should aim for a full 5 years of immunotherapy which will probably eliminate most of her atopic respiratory and conjunctival disease away and should allow her to expand her food repertoire beyond the restrictions that exist with her oral allergy syndrome at this  point.  Assuming she does well with this plan I will see her back in this clinic in 12 months or earlier if there is a problem.  Allena Katz, MD Allergy / Immunology [place]

## 2019-11-27 ENCOUNTER — Encounter: Payer: Self-pay | Admitting: Allergy and Immunology

## 2019-12-03 ENCOUNTER — Ambulatory Visit (INDEPENDENT_AMBULATORY_CARE_PROVIDER_SITE_OTHER): Payer: Managed Care, Other (non HMO)

## 2019-12-03 DIAGNOSIS — J3089 Other allergic rhinitis: Secondary | ICD-10-CM

## 2019-12-05 ENCOUNTER — Other Ambulatory Visit: Payer: Self-pay

## 2019-12-05 ENCOUNTER — Other Ambulatory Visit (INDEPENDENT_AMBULATORY_CARE_PROVIDER_SITE_OTHER): Payer: Managed Care, Other (non HMO)

## 2019-12-05 DIAGNOSIS — R6882 Decreased libido: Secondary | ICD-10-CM

## 2019-12-09 LAB — TESTT+TESTF+SHBG
Sex Hormone Binding: 114 nmol/L (ref 17.3–125.0)
Testosterone, Free: 1 pg/mL (ref 0.0–4.2)
Testosterone, Total, LC/MS: 68.1 ng/dL

## 2019-12-12 ENCOUNTER — Telehealth: Payer: Self-pay | Admitting: *Deleted

## 2019-12-12 DIAGNOSIS — R6882 Decreased libido: Secondary | ICD-10-CM

## 2019-12-12 NOTE — Telephone Encounter (Signed)
Burnice Logan, RN  12/12/2019 11:36 AM EDT    Spoke with patient, advised as seen below per Dr. Talbert Nan. Patient agreeable to proceed. Patient reports medication is helping. 1 mo lab appt scheduled for 01/21/20 at 10:45am. Advised patient RN will contact [facility] to [employer], will return call to provide update. Patient verbalizes understanding and is agreeable.   See telephone encounter dated 12/12/19.

## 2019-12-12 NOTE — Telephone Encounter (Signed)
Late entry: Call placed to [facility], spoke with [place].  Recommendations for Testosterone cream to Dr. Talbert Nan to review and advise.

## 2019-12-12 NOTE — Telephone Encounter (Signed)
-----   Message from Salvadore Dom, MD sent at 12/10/2019  1:41 PM EDT ----- Please let the patient know that her total testosterone level is still over the normal range. She is on a low dose, can you check with the pharmacy if they can make a 0.05% testosterone cream and have her use 0.3 mg daily? Would recheck her level one month after she is on the new dose. Please see if the testosterone is helping her.

## 2019-12-16 ENCOUNTER — Ambulatory Visit (INDEPENDENT_AMBULATORY_CARE_PROVIDER_SITE_OTHER): Payer: Managed Care, Other (non HMO)

## 2019-12-16 DIAGNOSIS — J309 Allergic rhinitis, unspecified: Secondary | ICD-10-CM | POA: Diagnosis not present

## 2019-12-18 NOTE — Telephone Encounter (Signed)
Dr. Talbert Nan -did you review recommendations per [facility] for testosterone cream?   Testosterone cream 0.3 mg/ gram, apply 1 ml.

## 2019-12-18 NOTE — Telephone Encounter (Signed)
Patient is calling to speak with [name].

## 2019-12-18 NOTE — Telephone Encounter (Signed)
I don't know how to compare that to the prior dose (1% cream, 0.25 grams daily). I want the dose to come down at least 25%

## 2019-12-19 MED ORDER — NONFORMULARY OR COMPOUNDED ITEM: 0 refills | Status: DC

## 2019-12-19 NOTE — Telephone Encounter (Signed)
Spoke with Dietrich Pates, Software engineer at [facility]. Advised of current RX for testosterone cream, would like to reduce by 25%.   Rx: Compounded Testosterone Cream 0.75%. Apply 1 click, [id number] grams daily.  Dispense 7.5 grams No refills.   Read back and confirmed.  Will review with Dr. Talbert Nan.   Rx pended. Routing to Dr. Talbert Nan to review Rx.

## 2019-12-19 NOTE — Telephone Encounter (Signed)
Thank you, script signed.

## 2019-12-20 NOTE — Telephone Encounter (Signed)
Rx printed.  Call placed to [facility], spoke with Maudie Mercury.  Verbal order given for Compounded Testosterone Cream 0.75%.  Apply 1 click, [id number] grams daily to lower abdomen or thighs.  Dispense 7.5 grams.  No refills.  Read back and confirmed.

## 2019-12-20 NOTE — Telephone Encounter (Signed)
Call to patient, left detailed message on mobile number, ok per dpr. Advised of new Rx for testosterone cream sent to [facility]. F/u with pharmacy for filling, return call to office if any additional questions. Keep lab appt as scheduled for 01/21/20.   Routing to provider for final review.  Will close encounter.

## 2019-12-26 ENCOUNTER — Ambulatory Visit (INDEPENDENT_AMBULATORY_CARE_PROVIDER_SITE_OTHER): Payer: Managed Care, Other (non HMO) | Admitting: *Deleted

## 2019-12-26 DIAGNOSIS — J309 Allergic rhinitis, unspecified: Secondary | ICD-10-CM | POA: Diagnosis not present

## 2020-01-02 ENCOUNTER — Ambulatory Visit (INDEPENDENT_AMBULATORY_CARE_PROVIDER_SITE_OTHER): Payer: Managed Care, Other (non HMO)

## 2020-01-02 DIAGNOSIS — J309 Allergic rhinitis, unspecified: Secondary | ICD-10-CM | POA: Diagnosis not present

## 2020-01-07 ENCOUNTER — Ambulatory Visit (INDEPENDENT_AMBULATORY_CARE_PROVIDER_SITE_OTHER): Payer: Managed Care, Other (non HMO)

## 2020-01-07 DIAGNOSIS — J309 Allergic rhinitis, unspecified: Secondary | ICD-10-CM

## 2020-01-15 ENCOUNTER — Ambulatory Visit (INDEPENDENT_AMBULATORY_CARE_PROVIDER_SITE_OTHER): Payer: Managed Care, Other (non HMO)

## 2020-01-15 DIAGNOSIS — J309 Allergic rhinitis, unspecified: Secondary | ICD-10-CM

## 2020-01-21 ENCOUNTER — Telehealth: Payer: Self-pay | Admitting: Obstetrics and Gynecology

## 2020-01-21 ENCOUNTER — Other Ambulatory Visit: Payer: Self-pay

## 2020-01-21 ENCOUNTER — Other Ambulatory Visit (INDEPENDENT_AMBULATORY_CARE_PROVIDER_SITE_OTHER): Payer: Managed Care, Other (non HMO)

## 2020-01-21 DIAGNOSIS — R6882 Decreased libido: Secondary | ICD-10-CM

## 2020-01-21 NOTE — Telephone Encounter (Signed)
Pt was seen here today for testosterone labs.  Pt asking for Rx refill.  Med refill request: Actonel Last AEX: 09/12/2019 with JJ Next AEX: not scheduled   Refill authorized: Please advise. Last ordered  09/2018 by Dr Phineas Real  Routing to Dr Talbert Nan for refill request.

## 2020-01-21 NOTE — Telephone Encounter (Signed)
Patient asked for a new prescription for risedronate. [facility], [address]

## 2020-01-22 MED ORDER — RISEDRONATE SODIUM 150 MG PO TABS: 150.0000 mg | ORAL_TABLET | ORAL | 2 refills | Status: DC

## 2020-01-22 NOTE — Telephone Encounter (Signed)
Pt called and made aware of sent Rx. Pt verbalized understanding and thankful for refill.  Encounter closed.

## 2020-01-22 NOTE — Telephone Encounter (Signed)
Actonel refill sent

## 2020-01-23 ENCOUNTER — Telehealth: Payer: Self-pay | Admitting: *Deleted

## 2020-01-23 LAB — TESTOSTERONE, FREE, TOTAL, SHBG
Sex Hormone Binding: 118 nmol/L (ref 17.3–125.0)
Testosterone, Free: 1.9 pg/mL (ref 0.0–4.2)
Testosterone: 17 ng/dL (ref 4–50)

## 2020-01-23 MED ORDER — NONFORMULARY OR COMPOUNDED ITEM: 1 refills | Status: DC

## 2020-01-23 NOTE — Telephone Encounter (Signed)
Spoke with patient. Advised of results as seen below per Dr. Talbert Nan. Patient reports compounded testosterone cream 0.75% is working well, no concerns. Patient request refill to [facility]. Advised patient will send Rx to [facility], f/u with pharmacy for filling. Patient verbalizes understanding and is agreeable.   Rx printed and to Dr. Talbert Nan to review and sign.   Encounter closed.

## 2020-01-23 NOTE — Telephone Encounter (Signed)
-----   Message from Salvadore Dom, MD sent at 01/23/2020  8:27 AM EDT ----- Her levels are in the normal range. Please see how she is feeling on her current dose of testosterone. If she feels it is helping then refill for 6 months.

## 2020-01-23 NOTE — Telephone Encounter (Signed)
Burnice Logan, RN  01/23/2020 11:13 AM EDT    Left message to call [name], RN at [facility].

## 2020-01-23 NOTE — Telephone Encounter (Signed)
Patient returned a call to Jill.   

## 2020-01-28 ENCOUNTER — Telehealth: Payer: Self-pay | Admitting: Obstetrics and Gynecology

## 2020-01-28 ENCOUNTER — Ambulatory Visit (INDEPENDENT_AMBULATORY_CARE_PROVIDER_SITE_OTHER): Payer: Managed Care, Other (non HMO)

## 2020-01-28 DIAGNOSIS — J309 Allergic rhinitis, unspecified: Secondary | ICD-10-CM | POA: Diagnosis not present

## 2020-01-28 MED ORDER — RISEDRONATE SODIUM 150 MG PO TABS: 150.0000 mg | ORAL_TABLET | ORAL | 2 refills | Status: DC

## 2020-01-28 NOTE — Telephone Encounter (Signed)
Patient called stating her Testosterone was not sent to [facility] was not sent to .

## 2020-01-28 NOTE — Telephone Encounter (Signed)
Per review of Epic, Rx for Actonel sent to pharmacy on 01/22/20.  Rx for Compounded testosterone faxed to [facility] on 01/23/20. Rx faxed again today.   Call placed to [facility], spoke with Georgette. Was advised no RX received for Actonel 150 mg tab. Rx re-ordered to pharmacy.   Call placed to [facility], spoke with [place], confirmed Rx received.   Call returned to patient. Advised as seen above. Apologies to patient. Patient to f/u with pharmacies for filling prescriptions. Patient is to return call to office if any additional assistance is needed.   Routing to provider for final review. Patient is agreeable to disposition. Will close encounter.

## 2020-02-11 ENCOUNTER — Ambulatory Visit (INDEPENDENT_AMBULATORY_CARE_PROVIDER_SITE_OTHER): Payer: Managed Care, Other (non HMO)

## 2020-02-11 DIAGNOSIS — J309 Allergic rhinitis, unspecified: Secondary | ICD-10-CM | POA: Diagnosis not present

## 2020-02-19 ENCOUNTER — Other Ambulatory Visit: Payer: Self-pay

## 2020-02-19 MED ORDER — MONTELUKAST SODIUM 10 MG PO TABS: ORAL_TABLET | ORAL | 5 refills | Status: DC

## 2020-02-25 ENCOUNTER — Ambulatory Visit (INDEPENDENT_AMBULATORY_CARE_PROVIDER_SITE_OTHER): Payer: Managed Care, Other (non HMO)

## 2020-02-25 DIAGNOSIS — J309 Allergic rhinitis, unspecified: Secondary | ICD-10-CM | POA: Diagnosis not present

## 2020-03-03 ENCOUNTER — Ambulatory Visit: Payer: Self-pay

## 2020-03-04 DIAGNOSIS — J301 Allergic rhinitis due to pollen: Secondary | ICD-10-CM | POA: Diagnosis not present

## 2020-03-04 NOTE — Progress Notes (Signed)
Vials exp 03-04-21

## 2020-03-10 ENCOUNTER — Ambulatory Visit (INDEPENDENT_AMBULATORY_CARE_PROVIDER_SITE_OTHER): Payer: Managed Care, Other (non HMO)

## 2020-03-10 DIAGNOSIS — J309 Allergic rhinitis, unspecified: Secondary | ICD-10-CM | POA: Diagnosis not present

## 2020-03-19 ENCOUNTER — Encounter: Payer: Self-pay | Admitting: Obstetrics and Gynecology

## 2020-03-24 ENCOUNTER — Ambulatory Visit (INDEPENDENT_AMBULATORY_CARE_PROVIDER_SITE_OTHER): Payer: Managed Care, Other (non HMO)

## 2020-03-24 DIAGNOSIS — J309 Allergic rhinitis, unspecified: Secondary | ICD-10-CM

## 2020-04-07 ENCOUNTER — Ambulatory Visit (INDEPENDENT_AMBULATORY_CARE_PROVIDER_SITE_OTHER): Payer: Managed Care, Other (non HMO)

## 2020-04-07 DIAGNOSIS — J309 Allergic rhinitis, unspecified: Secondary | ICD-10-CM | POA: Diagnosis not present

## 2020-04-23 ENCOUNTER — Ambulatory Visit (INDEPENDENT_AMBULATORY_CARE_PROVIDER_SITE_OTHER): Payer: Managed Care, Other (non HMO)

## 2020-04-23 DIAGNOSIS — J309 Allergic rhinitis, unspecified: Secondary | ICD-10-CM

## 2020-04-29 ENCOUNTER — Ambulatory Visit (INDEPENDENT_AMBULATORY_CARE_PROVIDER_SITE_OTHER): Payer: Managed Care, Other (non HMO)

## 2020-04-29 DIAGNOSIS — J309 Allergic rhinitis, unspecified: Secondary | ICD-10-CM

## 2020-05-04 ENCOUNTER — Ambulatory Visit (INDEPENDENT_AMBULATORY_CARE_PROVIDER_SITE_OTHER): Payer: Managed Care, Other (non HMO)

## 2020-05-04 DIAGNOSIS — J309 Allergic rhinitis, unspecified: Secondary | ICD-10-CM | POA: Diagnosis not present

## 2020-05-11 ENCOUNTER — Ambulatory Visit (INDEPENDENT_AMBULATORY_CARE_PROVIDER_SITE_OTHER): Payer: Managed Care, Other (non HMO)

## 2020-05-11 DIAGNOSIS — J309 Allergic rhinitis, unspecified: Secondary | ICD-10-CM | POA: Diagnosis not present

## 2020-05-18 ENCOUNTER — Other Ambulatory Visit: Payer: Self-pay

## 2020-05-18 ENCOUNTER — Encounter: Payer: Self-pay | Admitting: Nurse Practitioner

## 2020-05-18 ENCOUNTER — Ambulatory Visit (INDEPENDENT_AMBULATORY_CARE_PROVIDER_SITE_OTHER): Payer: Managed Care, Other (non HMO)

## 2020-05-18 ENCOUNTER — Ambulatory Visit (INDEPENDENT_AMBULATORY_CARE_PROVIDER_SITE_OTHER): Payer: Managed Care, Other (non HMO) | Admitting: Nurse Practitioner

## 2020-05-18 VITALS — BP 104/70 | HR 72 | Temp 98.0°F | Ht 61.0 in | Wt 136.0 lb

## 2020-05-18 DIAGNOSIS — J309 Allergic rhinitis, unspecified: Secondary | ICD-10-CM | POA: Diagnosis not present

## 2020-05-18 DIAGNOSIS — S6991XA Unspecified injury of right wrist, hand and finger(s), initial encounter: Secondary | ICD-10-CM | POA: Diagnosis not present

## 2020-05-18 NOTE — Progress Notes (Signed)
Subjective:  Patient ID: Yolanda [name], female    DOB: [birth year]  Age: 59 y.o. MRN: [id number]  CC: Acute Visit (Pt c/o right arm pain due to a fall x 11 days ago. Pt landed flat on her palms but states the day after her shoulders were hurting but her right hand and arm pain did not go away. )  Wrist Pain  The pain is present in the right wrist. This is a new problem. The current episode started 1 to 4 weeks ago. There has been a history of trauma. The problem occurs constantly. The problem has been unchanged. The quality of the pain is described as aching. Associated symptoms include tingling. Pertinent negatives include no fever, inability to bear weight, itching, joint locking, joint swelling, limited range of motion, numbness or stiffness. The symptoms are aggravated by activity. She has tried nothing for the symptoms. Family history does not include gout or rheumatoid arthritis. There is no history of diabetes, gout, osteoarthritis or rheumatoid arthritis.   Reviewed past Medical, Social and Family history today.  Outpatient Medications Prior to Visit  Medication Sig Dispense Refill  . albuterol (PROVENTIL HFA;VENTOLIN HFA) 108 (90 Base) MCG/ACT inhaler Inhale 2 puffs into the lungs every 6 (six) hours as needed for wheezing or shortness of breath. 1 Inhaler 2  . APLENZIN 174 MG TB24 Take 1 tablet by mouth daily.  0  . Calcium Carbonate (CALCIUM 600 PO) Take 1 tablet by mouth daily.    Marland Kitchen EPINEPHrine 0.3 mg/0.3 mL IJ SOAJ injection Inject 0.3 mg into the muscle as needed for anaphylaxis.   2  . Estradiol 10 MCG TABS vaginal tablet Insert one tablet vaginally qhs x 1 week, then 2 x a week at hs 24 tablet 3  . LORazepam (ATIVAN) 0.5 MG tablet Take 0.5 mg by mouth daily as needed for sleep.     Marland Kitchen MAGNESIUM GLYCINATE PLUS PO Take 2 capsules by mouth at bedtime.    . mometasone (NASONEX) 50 MCG/ACT nasal spray SHAKE LIQUID AND USE 2 SPRAYS IN EACH NOSTRIL DAILY 17 g 5  . montelukast (SINGULAIR)  10 MG tablet TAKE 1 TABLET(10 MG) BY MOUTH AT BEDTIME 30 tablet 5  . Multiple Vitamin (MULTIVITAMIN) tablet Take 1 tablet by mouth 2 (two) times daily.     . NONFORMULARY OR COMPOUNDED ITEM Compounded Testosterone Cream 0.75%. Apply 1 click, 7.90 grams daily to lower abdomen or thighs.  Dispense 22.5 grams One refill. 1 each 1  . olopatadine (PATANOL) 0.1 % ophthalmic solution INSTILL 1 DROP IN BOTH EYES TWICE DAILY 5 mL 0  . Omega-3 Fatty Acids (OMEGAPURE 780 EC PO) Take 1 tablet by mouth daily.    . risedronate (ACTONEL) 150 MG tablet Take 1 tablet (150 mg total) by mouth every 30 (thirty) days. TAKE ONE TABLET(150MG ) BY MOUTH EVERY 30 DAYS WITH WATER ON EMPTY STOMAACH NOTHING BY MOUTH AND DON'T LIE DOWN FOR 30 MINUTES 3 tablet 2   No facility-administered medications prior to visit.    ROS See HPI  Objective:  BP 104/70 (BP Location: Left Arm, Patient Position: Sitting, Cuff Size: Normal)   Pulse 72   Temp 98 F (36.7 C) (Temporal)   Ht 5\' 1"  (1.549 m)   Wt 136 lb (61.7 kg)   SpO2 99%   BMI 25.70 kg/m   Physical Exam Musculoskeletal:     Right upper arm: Normal.     Right elbow: Normal.     Right forearm: Normal.  Right wrist: Tenderness and bony tenderness present. No swelling, effusion, lacerations, snuff box tenderness or crepitus. Normal range of motion.     Left wrist: Normal.     Right hand: Normal.    Assessment & Plan:  This visit occurred during the SARS-CoV-2 public health emergency.  Safety protocols were in place, including screening questions prior to the visit, additional usage of staff PPE, and extensive cleaning of exam room while observing appropriate contact time as indicated for disinfecting solutions.   Doneisha was seen today for acute visit.  Diagnoses and all orders for this visit:  Right wrist injury, initial encounter -     DG Wrist Complete Right  negative fracture of x-ray. Use NSAIDs, and wrist brace x1week. Hold participation in golf or  tennis. Consider referral to ortho if symptoms persist  Problem List Items Addressed This Visit    None    Visit Diagnoses    Right wrist injury, initial encounter    -  Primary   Relevant Orders   DG Wrist Complete Right (Completed)      Follow-up: No follow-ups on file.  Wilfred Lacy, NP

## 2020-05-18 NOTE — Patient Instructions (Signed)
Go to lab for x-ray

## 2020-06-01 ENCOUNTER — Ambulatory Visit (INDEPENDENT_AMBULATORY_CARE_PROVIDER_SITE_OTHER): Payer: Managed Care, Other (non HMO)

## 2020-06-01 DIAGNOSIS — J309 Allergic rhinitis, unspecified: Secondary | ICD-10-CM | POA: Diagnosis not present

## 2020-06-08 ENCOUNTER — Encounter: Payer: Managed Care, Other (non HMO) | Admitting: Nurse Practitioner

## 2020-06-15 ENCOUNTER — Encounter: Payer: Self-pay | Admitting: Nurse Practitioner

## 2020-06-15 ENCOUNTER — Ambulatory Visit (INDEPENDENT_AMBULATORY_CARE_PROVIDER_SITE_OTHER): Payer: Managed Care, Other (non HMO) | Admitting: Nurse Practitioner

## 2020-06-15 ENCOUNTER — Other Ambulatory Visit: Payer: Self-pay

## 2020-06-15 VITALS — BP 116/78 | HR 72 | Temp 98.1°F | Ht 61.0 in | Wt 135.8 lb

## 2020-06-15 DIAGNOSIS — Z0000 Encounter for general adult medical examination without abnormal findings: Secondary | ICD-10-CM | POA: Diagnosis not present

## 2020-06-15 DIAGNOSIS — J310 Chronic rhinitis: Secondary | ICD-10-CM

## 2020-06-15 DIAGNOSIS — M7702 Medial epicondylitis, left elbow: Secondary | ICD-10-CM

## 2020-06-15 DIAGNOSIS — R748 Abnormal levels of other serum enzymes: Secondary | ICD-10-CM | POA: Diagnosis not present

## 2020-06-15 DIAGNOSIS — Z23 Encounter for immunization: Secondary | ICD-10-CM

## 2020-06-15 DIAGNOSIS — F325 Major depressive disorder, single episode, in full remission: Secondary | ICD-10-CM

## 2020-06-15 HISTORY — DX: Medial epicondylitis, left elbow: M77.02

## 2020-06-15 LAB — HEPATIC FUNCTION PANEL
ALT: 15 U/L (ref 0–35)
AST: 17 U/L (ref 0–37)
Albumin: 4.4 g/dL (ref 3.5–5.2)
Alkaline Phosphatase: 91 U/L (ref 39–117)
Bilirubin, Direct: 0 mg/dL (ref 0.0–0.3)
Total Bilirubin: 0.3 mg/dL (ref 0.2–1.2)
Total Protein: 7.1 g/dL (ref 6.0–8.3)

## 2020-06-15 MED ORDER — DICLOFENAC SODIUM 1 % EX GEL: 2.0000 g | Freq: Three times a day (TID) | CUTANEOUS | 0 refills | Status: DC

## 2020-06-15 NOTE — Assessment & Plan Note (Signed)
Improved with allergy shots every 2weeks She will like to hold montelukast at this time

## 2020-06-15 NOTE — Progress Notes (Signed)
Subjective:    Patient ID: Yolanda [name], female    DOB: 04/08/1961, 59 y.o.   MRN: [id number]  Patient presents today for CPE  And eval of elbow pain  HPI RHINITIS, CHRONIC Improved with allergy shots every 2weeks She will like to hold montelukast at this time  Depression, major, single episode, complete remission (HCC) Stable mood with prn use of lorazepam Prescribed by psychiatrist  Medial epicondylitis of elbow, left Onset 22monthago, Persistent right inner elbow, related to tennis games 3x/week. She had wrist pain 147monthgo, which improvement with brace, rest and use of naproxen. She denies any radicular pain or swelling or paresthesia or weakness or limited ROM.  Advised about need to rest by stopping tennis games, use of elbow sleeve, cold compress and Voltaren gel. Ref to sports medicine if no improvement.  Reviewed lab results completed by GYN 08/2019: CBC, CMP, lipid panel, HgbA1c. (all normal except mild elevation in ALP).  Sexual History (orientation,birth control, marital status, STD):married, sexually active, up ro date with pelvic and breast exam, up to date with mammogram. Current use of actonel, testosterone and estrogen to manage low libido, postmenopausal symptoms, and osteoporosis  Depression/Suicide: [facility] 06/15/2020 04/24/2018  Decreased Interest 0 0  Down, Depressed, Hopeless 0 0  PHQ - 2 Score 0 0  Altered sleeping - 1  Tired, decreased energy - 0  Change in appetite - 0  Feeling bad or failure about yourself  - 0  Trouble concentrating - 0  Moving slowly or fidgety/restless - 0  Suicidal thoughts - 0  PHQ-9 Score - 1  Difficult doing work/chores - Not difficult at all   GAD 7 : Generalized Anxiety Score 04/24/2018  Nervous, Anxious, on Edge 0  Control/stop worrying 0  Worry too much - different things 0  Trouble relaxing 0  Restless 0  Easily annoyed or irritable 0  Afraid - awful might happen 0  Total GAD 7 Score 0   Anxiety Difficulty Not difficult at all   Vision:up to date, use of corrective len  Dental:up to date  Immunizations: (TDAP, Hep C screen, Pneumovax, Influenza, zoster)  Health Maintenance  Topic Date Due  . Pap Smear  08/17/2020  . Mammogram  03/19/2022  . Colon Cancer Screening  03/21/2023  . Tetanus Vaccine  03/20/2028  . Flu Shot  Completed  . COVID-19 Vaccine  Completed  .  Hepatitis C: One time screening is recommended by Center for Disease Control  (CDC) for  adults born from 1917hrough 1965.   Completed  . HIV Screening  Completed   Diet:heart healthy.  Weight:  Wt Readings from Last 3 Encounters:  06/15/20 135 lb 12.8 oz (61.6 kg)  05/18/20 136 lb (61.7 kg)  11/26/19 137 lb 3.2 oz (62.2 kg)   Fall Risk: Fall Risk  10/10/2019  Falls in the past year? 0  Follow up Falls evaluation completed   Advanced Directive: Advanced Directives 09/14/2018  Does Patient Have a Medical Advance Directive? No  Would patient like information on creating a medical advance directive? No - Patient declined    Medications and allergies reviewed with patient and updated if appropriate.  Patient Active Problem List   Diagnosis Date Noted  . Medial epicondylitis of elbow, left 06/15/2020  . Renal cyst, right 09/19/2019  . 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/18/2019  . Lumbar radiculopathy 09/09/2019  . Low back pain 05/27/2019  . Cl[place] fracture of coccyx ([place]/14/[place]  . Well  woman exam without gynecological exam 03/20/2018  . Food allergy 03/20/2018  . SOB (shortness of breath) on exertion 03/20/2018  . Genetic testing 12/23/2014  . Family history of breast cancer   . Family history of uterine cancer   . Osteoporosis 10/04/2014  . Pancreatic duct dilated 05/10/2012  . IBS 08/01/2008  . UNS ADVRS EFF UNS RX MEDICINAL&BIOLOGICAL SBSTNC 01/21/2008  . Depression, major, single episode, complete remission ([place]) 01/18/2008  . RHINITIS, CHRONIC 01/18/2008    Current  Outpatient Medications on File Prior to Visit  Medication Sig Dispense Refill  . albuterol (PROVENTIL HFA;VENTOLIN HFA) 108 (90 Base) MCG/ACT inhaler Inhale 2 puffs into the lungs every 6 (six) hours as needed for wheezing or shortness of breath. 1 Inhaler 2  . APLENZIN 174 MG TB24 Take 1 tablet by mouth daily.  0  . Calcium Carbonate (CALCIUM 600 PO) Take 1 tablet by mouth daily.    Marland Kitchen EPINEPHrine 0.3 mg/0.3 mL IJ SOAJ injection Inject 0.3 mg into the muscle as needed for anaphylaxis.   2  . Estradiol 10 MCG TABS vaginal tablet Insert one tablet vaginally qhs x 1 week, then 2 x a week at hs 24 tablet 3  . LORazepam (ATIVAN) 0.5 MG tablet Take 0.5 mg by mouth daily as needed for sleep.     Marland Kitchen MAGNESIUM GLYCINATE PLUS PO Take 2 capsules by mouth at bedtime.    . mometasone (NASONEX) 50 MCG/ACT nasal spray SHAKE LIQUID AND USE 2 SPRAYS IN EACH NOSTRIL DAILY 17 g 5  . montelukast (SINGULAIR) 10 MG tablet TAKE 1 TABLET(10 MG) BY MOUTH AT BEDTIME 30 tablet 5  . Multiple Vitamin (MULTIVITAMIN) tablet Take 1 tablet by mouth 2 (two) times daily.     . NONFORMULARY OR COMPOUNDED ITEM Compounded Testosterone Cream 0.75%. Apply 1 click, 7.09 grams daily to lower abdomen or thighs.  Dispense 22.5 grams One refill. 1 each 1  . olopatadine (PATANOL) 0.1 % ophthalmic solution INSTILL 1 DROP IN BOTH EYES TWICE DAILY 5 mL 0  . Omega-3 Fatty Acids (OMEGAPURE 780 EC PO) Take 1 tablet by mouth daily.    . risedronate (ACTONEL) 150 MG tablet Take 1 tablet (150 mg total) by mouth every 30 (thirty) days. TAKE ONE TABLET(150MG) BY MOUTH EVERY 30 DAYS WITH WATER ON EMPTY STOMAACH NOTHING BY MOUTH AND DON'T LIE DOWN FOR 30 MINUTES 3 tablet 2   No current facility-administered medications on file prior to visit.    Past Medical History:  Diagnosis Date  . Acute appendicitis 09/14/2018  . Allergy   . Anemia   . Anxiety   . BRCA negative 12/2014  . Depression   . Endometriosis   . Esophagitis   . Fibroid   . GERD  (gastroesophageal reflux disease)   . Hiatal hernia   . History of diverticulitis   . IC (interstitial cystitis)   . Osteoporosis 01/2019   2018 T score -2.6, 2020 T score -2.0 on Actonel  . Schatzki's ring     Past Surgical History:  Procedure Laterality Date  . CHOLECYSTECTOMY    . HYSTEROSCOPY  12/2003   RESECTON OF SUBMUCOUS  MYOMA/DX SCOPE  . LAPAROSCOPIC APPENDECTOMY N/A 09/14/2018   Procedure: APPENDECTOMY LAPAROSCOPIC;  Surgeon: Ileana Roup, MD;  Location: [facility];  Service: General;  Laterality: N/A;  . LAPAROSCOPIC BILATERAL SALPINGO OOPHERECTOMY Bilateral 07/02/2014   Procedure: LAPAROSCOPIC BILATERAL SALPINGO OOPHORECTOMY;  Surgeon: Anastasio Auerbach, MD;  Location: [facility];  Service: Gynecology;  Laterality: Bilateral;  .  PELVIC LAPAROSCOPY  12/2003   DX SCOPE/RESECTION OF SUBMUCOUS MYOMA  . TMJ ARTHROSCOPY  11-26-2001  . VAGINAL HYSTERECTOMY  12/05   Menorrhagia/dysmenorrhea. Pathology showed leiomyoma/adenomyosis    Social History   Socioeconomic History  . Marital status: Married    Spouse name: Not on file  . Number of children: 2  . Years of education: Not on file  . Highest education level: Not on file  Occupational History  . Not on file  Tobacco Use  . Smoking status: Never Smoker  . Smokeless tobacco: Never Used  Vaping Use  . Vaping Use: Never used  Substance and Sexual Activity  . Alcohol use: Yes    Alcohol/week: 2.0 standard drinks    Types: 2 Glasses of wine per week    Comment: week  . Drug use: No  . Sexual activity: Yes    Birth control/protection: Surgical    Comment: HYST-1st intercourse 59 yo-Fewer than 5 partners  Other Topics Concern  . Not on file  Social History Narrative  . Not on file   Social Determinants of Health   Financial Resource Strain:   . Difficulty of Paying Living Expenses: Not on file  Food Insecurity:   . Worried About Charity fundraiser in the Last Year: Not on file  . Ran Out of Food in the Last  Year: Not on file  Transportation Needs:   . Lack of Transportation (Medical): Not on file  . Lack of Transportation (Non-Medical): Not on file  Physical Activity:   . Days of Exercise per Week: Not on file  . Minutes of Exercise per Session: Not on file  Stress:   . Feeling of Stress : Not on file  Social Connections:   . Frequency of Communication with Friends and Family: Not on file  . Frequency of Social Gatherings with Friends and Family: Not on file  . Attends Religious Services: Not on file  . Active Member of Clubs or Organizations: Not on file  . Attends Archivist Meetings: Not on file  . Marital Status: Not on file    Family History  Problem Relation Age of Onset  . Uterine cancer Maternal Grandmother        dx in her 60s  . Breast cancer Sister        paternal half sister dx in her early 13s  . Cystic fibrosis Other        brother's granddauther  . Emphysema Father   . Early death Father   . Breast cancer Sister        paternal half sister dx in her late 76s  . Cystic fibrosis Other        brothers granddaughters  . Depression Mother   . Hyperlipidemia Mother   . Hypertension Mother   . Mental illness Mother   . Miscarriages / [place] Mother   . Diabetes Brother 50       HALF BROTHER/HALF BROTHER   . Breast cancer Sister   . GI problems Brother   . Colon cancer Neg Hx   . Esophageal cancer Neg Hx   . Rectal cancer Neg Hx   . Stomach cancer Neg Hx   . Allergic rhinitis Neg Hx   . Asthma Neg Hx   . Eczema Neg Hx   . Urticaria Neg Hx         Review of Systems  Constitutional: Negative for fever, malaise/fatigue and weight loss.  HENT: Negative for congestion and  sore throat.   Eyes:       Negative for visual changes  Respiratory: Negative for cough and shortness of breath.   Cardiovascular: Negative for chest pain, palpitations and leg swelling.  Gastrointestinal: Negative for blood in stool, constipation, diarrhea and heartburn.   Genitourinary: Negative for dysuria, frequency and urgency.  Musculoskeletal: Negative for falls, joint pain and myalgias.  Skin: Negative for rash.  Neurological: Negative for dizziness, sensory change and headaches.  Endo/Heme/Allergies: Does not bruise/bleed easily.  Psychiatric/Behavioral: Negative for depression, substance abuse and suicidal ideas. The patient is not nervous/anxious.     Objective:   Vitals:   06/15/20 1023  BP: 116/78  Pulse: 72  Temp: 98.1 F (36.7 C)  SpO2: 98%    Body mass index is 25.66 kg/m.   Physical Examination:  Physical Exam Vitals and nursing note reviewed.  HENT:     Right Ear: Tympanic membrane, ear canal and external ear normal.     Left Ear: Tympanic membrane, ear canal and external ear normal.  Cardiovascular:     Rate and Rhythm: Normal rate and regular rhythm.     Pulses: Normal pulses.     Heart sounds: Normal heart sounds.  Pulmonary:     Effort: Pulmonary effort is normal.     Breath sounds: Normal breath sounds.  Abdominal:     General: Bowel sounds are normal.     Palpations: Abdomen is soft.  Genitourinary:    Comments: Deferred breast and pelvic exam to GYN Musculoskeletal:        General: Normal range of motion.     Right upper arm: Normal.     Right elbow: Tenderness present in medial epicondyle. No radial head, lateral epicondyle or olecranon process tenderness.     Left elbow: Normal.     Right forearm: Normal.     Right wrist: Normal.     Right hand: Normal.     Cervical back: Normal range of motion and neck supple.     Right lower leg: No edema.     Left lower leg: No edema.  Lymphadenopathy:     Cervical: No cervical adenopathy.  Skin:    General: Skin is warm and dry.     Findings: No erythema or rash.  Neurological:     Mental Status: She is alert and oriented to person, place, and time.  Psychiatric:        Mood and Affect: Mood normal.        Behavior: Behavior normal.        Thought Content:  Thought content normal.    ASSESSMENT and PLAN: This visit occurred during the SARS-CoV-2 public health emergency.  Safety protocols were in place, including screening questions prior to the visit, additional usage of staff PPE, and extensive cleaning of exam room while observing appropriate contact time as indicated for disinfecting solutions.   Serenity was seen today for annual exam.  Diagnoses and all orders for this visit:  Preventative health care  Influenza vaccine needed -     Flu Vaccine QUAD 6+ mos PF IM (Fluarix Quad PF)  Elevated alkaline phosphatase level -     Hepatic function panel  Medial epicondylitis of elbow, left -     diclofenac Sodium (VOLTAREN) 1 % GEL; Apply 2 g topically 3 (three) times daily.  RHINITIS, CHRONIC  Depression, major, single episode, complete remission ([place])      Problem List Items Addressed This Visit      Respiratory  RHINITIS, CHRONIC    Improved with allergy shots every 2weeks She will like to hold montelukast at this time        Musculoskeletal and Integument   Medial epicondylitis of elbow, left    Onset 29monthago, Persistent right inner elbow, related to tennis games 3x/week. She had wrist pain 145monthgo, which improvement with brace, rest and use of naproxen. She denies any radicular pain or swelling or paresthesia or weakness or limited ROM.  Advised about need to rest by stopping tennis games, use of elbow sleeve, cold compress and Voltaren gel. Ref to sports medicine if no improvement.      Relevant Medications   diclofenac Sodium (VOLTAREN) 1 % GEL     Other   Depression, major, single episode, complete remission (HCC)    Stable mood with prn use of lorazepam Prescribed by psychiatrist       Other Visit Diagnoses    Preventative health care    -  Primary   Influenza vaccine needed       Relevant Orders   Flu Vaccine QUAD 6+ mos PF IM (Fluarix Quad PF) (Completed)   Elevated alkaline phosphatase level        Relevant Orders   Hepatic function panel      I have spent 3053m with this patient regarding history taking, documentation, review of labs and GYN, formulating plan and discussing treatment options with patient.  Follow up: Return in about 1 year (around 06/15/2021) for CPE(fasting).  ChaWilfred LacyP

## 2020-06-15 NOTE — Assessment & Plan Note (Addendum)
Stable mood with prn use of lorazepam Prescribed by psychiatrist

## 2020-06-15 NOTE — Assessment & Plan Note (Signed)
Onset 39month ago, Persistent right inner elbow, related to tennis games 3x/week. She had wrist pain 47month ago, which improvement with brace, rest and use of naproxen. She denies any radicular pain or swelling or paresthesia or weakness or limited ROM.  Advised about need to rest by stopping tennis games, use of elbow sleeve, cold compress and Voltaren gel. Ref to sports medicine if no improvement.

## 2020-06-15 NOTE — Assessment & Plan Note (Signed)
>>  ASSESSMENT AND PLAN FOR DEPRESSION, MAJOR, SINGLE EPISODE, COMPLETE REMISSION (HCC) WRITTEN ON 06/15/2020 11:10 AM BY Pierson Vantol LUM, NP  Stable mood with prn use of lorazepam Prescribed by psychiatrist

## 2020-06-15 NOTE — Patient Instructions (Signed)
Go to lab for blood draw Ok to hold montelukast while taking allergy injections If no improvement with rest, use of brace and voltaren gel; a referral to sports medicine is necessary. Ok to schedule nurse visit for zostavax vaccine.  Pitcher's Elbow  Pitcher's elbow is a type of elbow injury that develops gradually over time (overuse injury). This condition is also called valgus extension overload syndrome (VEOS). This injury is common in athletes who make repeated overhead throwing motions, such as a baseball pitcher repeatedly throwing a ball. Throwing motions can:  Overstretch the strong band of tissue (ligament) on the inside of the elbow (ulnar collateral ligament, or UCL). UCL injuries can range from minor inflammation to a complete ligament tear.  Push the bones at the outside and back of the elbow together (compression). These forces can injure the ligament over time and create an abnormal extra bone in the elbow (osteophyte or bone spur). What are the causes? This condition may be caused by:  Continuous or repetitive overhead throwing, such as baseball pitching.  Improper throwing motion.  Tightness in the shoulder that puts added demand on the elbow. What increases the risk? This condition is more likely to develop in athletes who play sports that involve repetitive forceful straightening of the elbow, such as:  Baseball or softball.  Tennis and other racquet sports.  Football.  Lacrosse.  Gymnastics.  Javelin. What are the signs or symptoms? Symptoms of this condition include:  Elbow pain.  Increased pain in your elbow as you straighten your arm forcefully, such as when throwing.  Swelling.  Limited range of motion or "locking" of the elbow.  Popping or tearing sensation in your elbow. How is this diagnosed? This condition is diagnosed based on:  Your symptoms and medical history.  A physical exam. Your health care provider may: ? Check your elbow's  strength, stability, and range of motion. ? Compare your injured elbow to your other elbow. ? Gently press your arm and elbow to find the source of pain.  Imaging tests, such as: ? X-rays or CT scans to check for stress fractures and bone spurs. ? Ultrasound or MRI to check for tears in the ligaments or tendons. How is this treated? Treatment for this condition may include:  Stopping activities that require overhead arm motions, then returning gradually to full activities.  Modifying your sports technique to decrease elbow strain. This may include wearing a brace.  Taking medicine to relieve pain.  Injecting medicines (corticosteroids) into your elbow to reduce swelling and pain.  Doing strength and range-of-motion exercises (physical therapy) as told by your health care provider.  Surgery. This may be needed if all other treatments do not work. It may involve: ? Repairing damaged ligaments. ? Removing abnormal bone growths. ? Removing pieces of bone or cartilage. After surgery, you will have to wear a brace for several weeks and eventually have physical therapy. Follow these instructions at home: If you have a brace:  Wear the brace as told by your health care provider. Remove it only as told by your health care provider.  Loosen the brace if your fingers tingle, become numb, or turn cold and blue.  Keep the brace clean.  If the brace is not waterproof: ? Do not let it get wet. ? Cover it with a watertight covering when you take a bath or a shower. Managing pain, stiffness, and swelling   If directed, put ice on the injured area. ? If you have a removable  brace, remove it as told by your health care provider. ? Put ice in a plastic bag. ? Place a towel between your skin and the bag. ? Leave the ice on for 20 minutes, 2-3 times a day.  Move your fingers often to avoid stiffness and to lessen swelling.  Raise (elevate) the injured area above the level of your heart  while you are sitting or lying down. Activity  Rest your elbow and avoid activities that require overhead arm motions as told by your health care provider.  Return to your normal activities as told by your health care provider. Ask your health care provider what activities are safe for you.  Do exercises as told by your health care provider. General instructions  Do not use any products that contain nicotine or tobacco, such as cigarettes, e-cigarettes, and chewing tobacco. These can delay healing. If you need help quitting, ask your health care provider.  Take over-the-counter and prescription medicines only as told by your health care provider.  Keep all follow-up visits as told by your health care provider. This is important. How is this prevented?  Warm up and stretch before being active.  Cool down and stretch after being active.  Give your body time to rest between periods of activity.  Maintain physical fitness, including: ? Strength. ? Flexibility.  Have your technique checked to make sure you use proper form.  Rest your elbow if you show signs of fatigue.  When you start any new athletic activity, increase your participation slowly.  Follow the rules for your sport on how often and how much you can throw in a game.  Avoid overhead throwing motions as told by your health care provider. Contact a health care provider if:  Your pain does not improve or it gets worse after 2-4 weeks of rest. Get help right away if:  Your pain is severe.  You cannot move your arm or elbow. These symptoms may represent a serious problem that is an emergency. Do not wait to see if the symptoms will go away. Get medical help right away. Call your local emergency services (911 in the [place] Do not drive yourself to the hospital. Summary  Pitcher's elbow is a type of elbow injury that develops gradually over time.  Treatment depends on the severity of the injury.  Rest your elbow and  avoid activities that require overhead arm motions as told by your health care provider.  If directed, put ice on the injured area. This information is not intended to replace advice given to you by your health care provider. Make sure you discuss any questions you have with your health care provider. Document Revised: 11/29/2018 Document Reviewed: 02/01/2018 Elsevier Patient Education  [place].

## 2020-06-17 ENCOUNTER — Ambulatory Visit (INDEPENDENT_AMBULATORY_CARE_PROVIDER_SITE_OTHER): Payer: Managed Care, Other (non HMO) | Admitting: *Deleted

## 2020-06-17 DIAGNOSIS — J309 Allergic rhinitis, unspecified: Secondary | ICD-10-CM | POA: Diagnosis not present

## 2020-06-29 ENCOUNTER — Ambulatory Visit (INDEPENDENT_AMBULATORY_CARE_PROVIDER_SITE_OTHER): Payer: Managed Care, Other (non HMO) | Admitting: *Deleted

## 2020-06-29 DIAGNOSIS — J309 Allergic rhinitis, unspecified: Secondary | ICD-10-CM

## 2020-07-07 ENCOUNTER — Other Ambulatory Visit: Payer: Self-pay | Admitting: Obstetrics and Gynecology

## 2020-07-07 DIAGNOSIS — J302 Other seasonal allergic rhinitis: Secondary | ICD-10-CM

## 2020-07-07 DIAGNOSIS — N952 Postmenopausal atrophic vaginitis: Secondary | ICD-10-CM

## 2020-07-07 NOTE — Telephone Encounter (Signed)
Medication refill request: Estradiol 0.05  Last AEX:  09/12/19 Next AEX: 10/29/20  Last MMG (if hormonal medication request): 03/19/20  Neg  Refill authorized: 25/0

## 2020-07-07 NOTE — Progress Notes (Signed)
Vials exp 07-07-21

## 2020-07-07 NOTE — Telephone Encounter (Signed)
Patient asked for refills of estradiol to [facility].

## 2020-07-08 MED ORDER — ESTRADIOL 10 MCG VA TABS: ORAL_TABLET | VAGINAL | 0 refills | Status: DC

## 2020-07-13 ENCOUNTER — Ambulatory Visit (INDEPENDENT_AMBULATORY_CARE_PROVIDER_SITE_OTHER): Payer: Managed Care, Other (non HMO)

## 2020-07-13 DIAGNOSIS — J309 Allergic rhinitis, unspecified: Secondary | ICD-10-CM | POA: Diagnosis not present

## 2020-07-22 ENCOUNTER — Other Ambulatory Visit: Payer: Self-pay | Admitting: Obstetrics and Gynecology

## 2020-07-22 DIAGNOSIS — N952 Postmenopausal atrophic vaginitis: Secondary | ICD-10-CM

## 2020-07-22 NOTE — Telephone Encounter (Signed)
Medication refill request: Vagifem 62mcg  Last AEX:  09/12/19 Next AEX: 10/29/20 Last MMG (if hormonal medication request): 03/19/20  Neg  Refill authorized: 24/0

## 2020-07-22 NOTE — Telephone Encounter (Signed)
3 month refill sent 2 weeks ago. Please check with the pharmacy.

## 2020-07-27 ENCOUNTER — Ambulatory Visit (INDEPENDENT_AMBULATORY_CARE_PROVIDER_SITE_OTHER): Payer: Managed Care, Other (non HMO)

## 2020-07-27 DIAGNOSIS — J309 Allergic rhinitis, unspecified: Secondary | ICD-10-CM

## 2020-08-10 ENCOUNTER — Ambulatory Visit (INDEPENDENT_AMBULATORY_CARE_PROVIDER_SITE_OTHER): Payer: Managed Care, Other (non HMO)

## 2020-08-10 DIAGNOSIS — J309 Allergic rhinitis, unspecified: Secondary | ICD-10-CM | POA: Diagnosis not present

## 2020-08-13 IMAGING — US US RENAL
1 series · 14 of 25 positions shown · non-contrast
Comparison: September 14, 2018.

CLINICAL DATA: Right renal cyst.

EXAM:
RENAL / URINARY TRACT ULTRASOUND COMPLETE

[Series 1: us renal · 0.20mm/px · 14 of 42 slices shown]
[im 1/42]
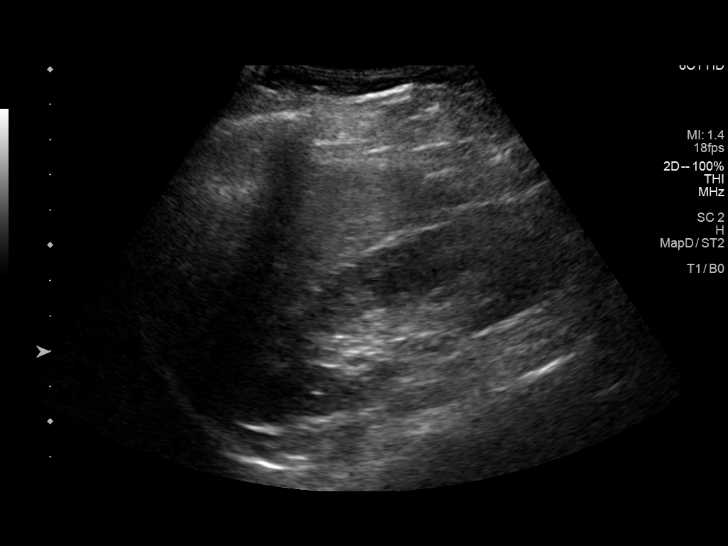
[im 4/42]
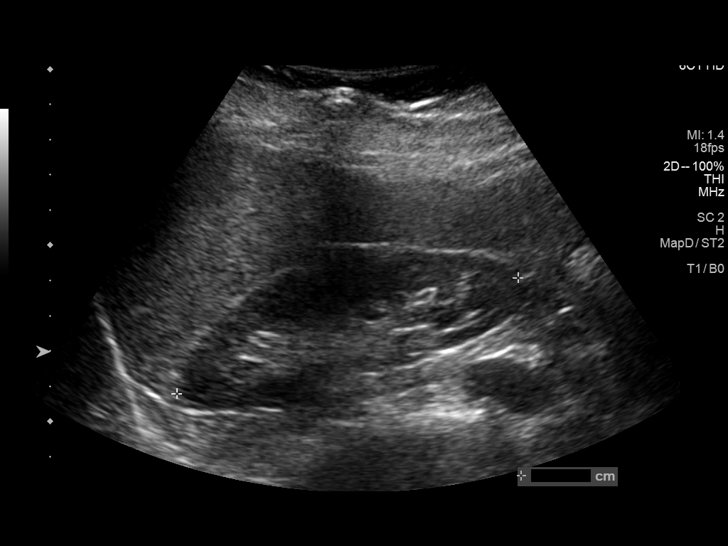
[im 7/42]
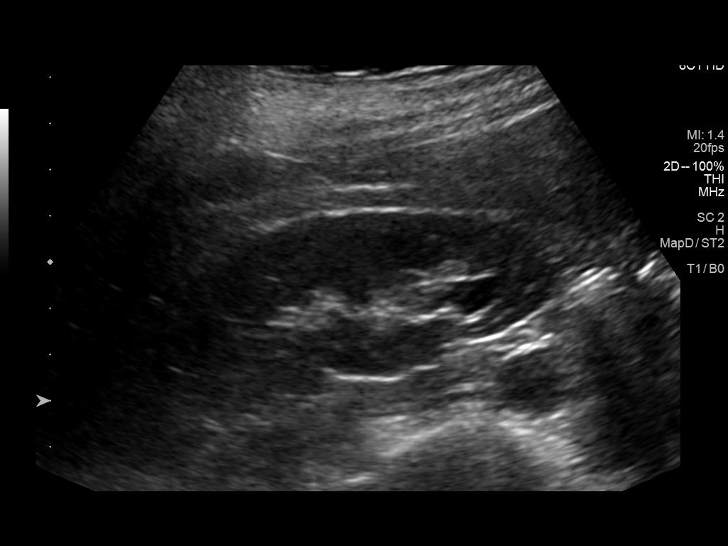
[im 11/42]
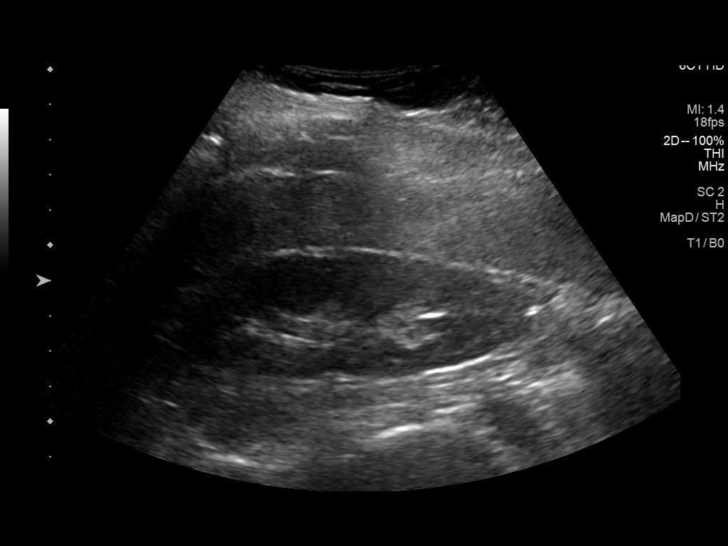
[im 14/42]
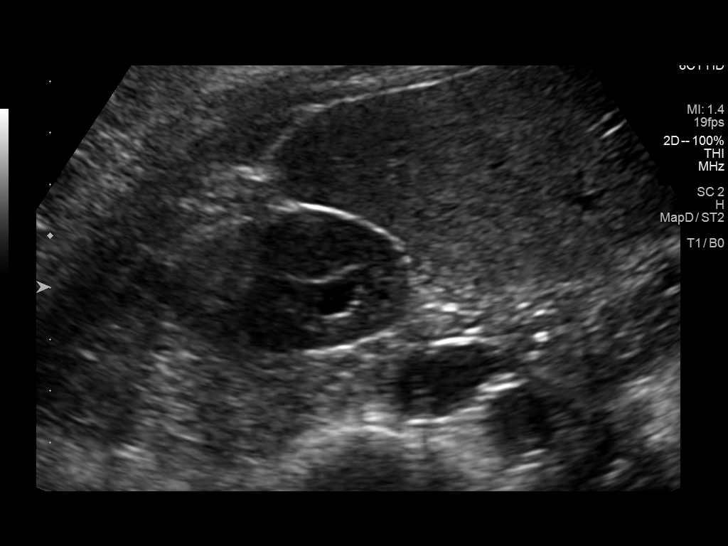
[im 16/42]
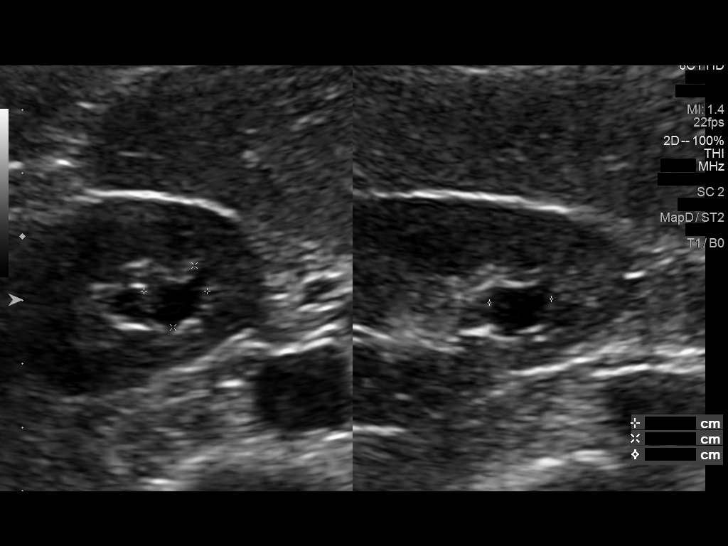
[im 19/42]
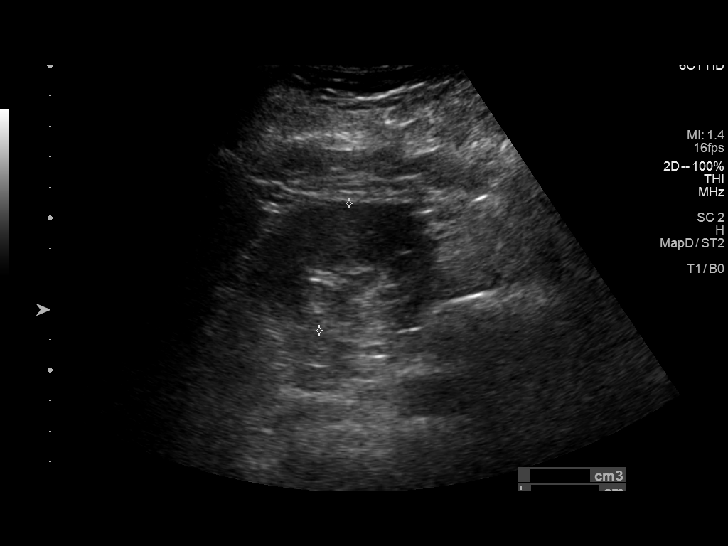
[im 23/42]
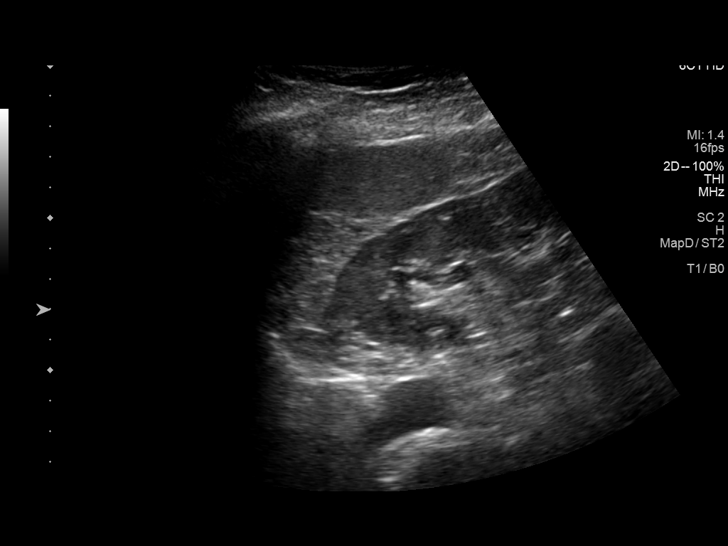
[im 26/42]
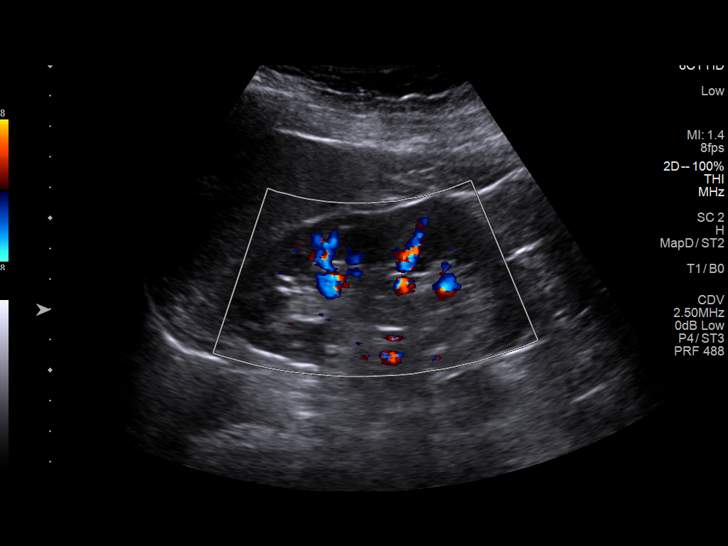
[im 28/42]
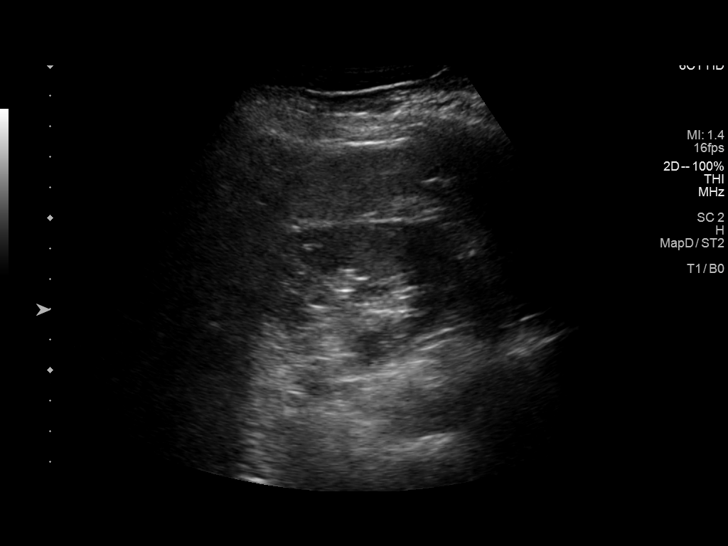
[im 31/42]
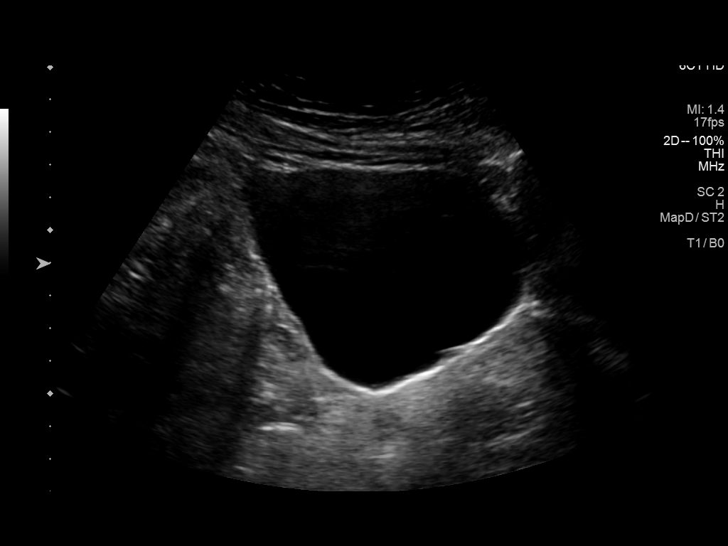
[im 35/42]
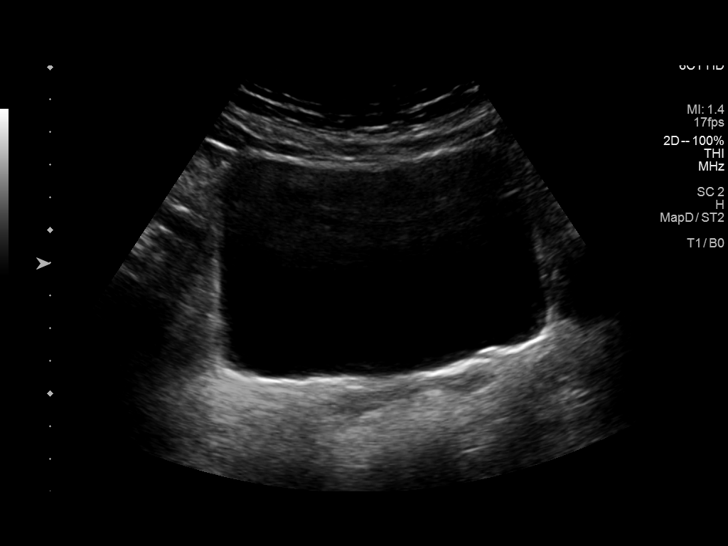
[im 38/42]
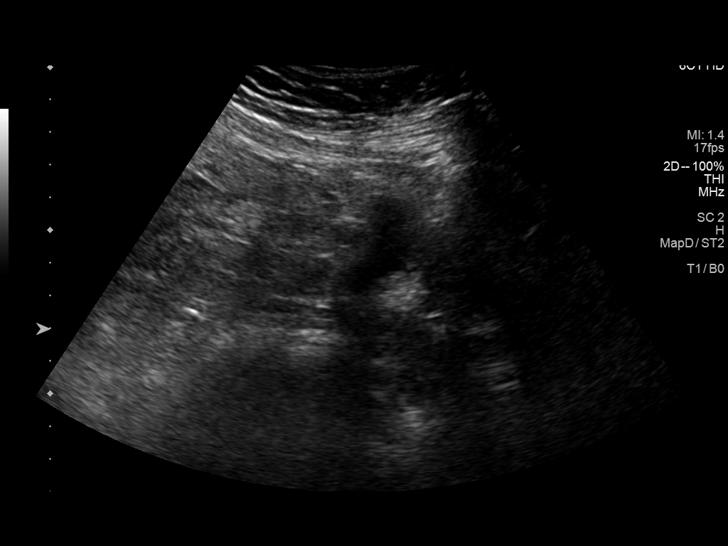
[im 42/42]
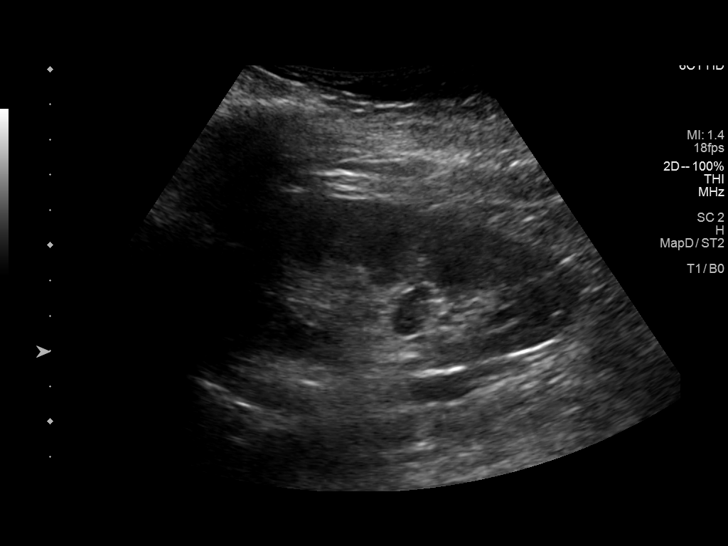

[14 of 25 positions shown; findings below may reference images not displayed]

FINDINGS: Right Kidney:

Renal measurements: 11.3 x 4.6 x 4.4 cm = volume: 119 mL. 1 cm
anechoic area is seen in lower pole which may represent a dilated
calyx. Echogenicity within normal limits. No mass or hydronephrosis
visualized.

Left Kidney:

Renal measurements: 10.8 x 5.4 x 4.3 cm = volume: 131 mL.
Echogenicity within normal limits. No mass or hydronephrosis
visualized.

Bladder:

Appears normal for degree of bladder distention. Left ureteral jet
is visualized.

Other:

None.
IMPRESSION: 1 cm anechoic areas seen in lower pole of right kidney which most
likely represents a mildly dilated calyx. No hydronephrosis is
noted. No other significant renal abnormality is noted.

## 2020-08-18 ENCOUNTER — Other Ambulatory Visit: Payer: Self-pay | Admitting: Nurse Practitioner

## 2020-08-18 NOTE — Telephone Encounter (Signed)
Chart supports Rx Pt last appt 06/15/20

## 2020-08-24 ENCOUNTER — Ambulatory Visit (INDEPENDENT_AMBULATORY_CARE_PROVIDER_SITE_OTHER): Payer: Managed Care, Other (non HMO) | Admitting: *Deleted

## 2020-08-24 DIAGNOSIS — J309 Allergic rhinitis, unspecified: Secondary | ICD-10-CM | POA: Diagnosis not present

## 2020-08-28 ENCOUNTER — Other Ambulatory Visit: Payer: Self-pay | Admitting: Obstetrics and Gynecology

## 2020-08-28 DIAGNOSIS — R6882 Decreased libido: Secondary | ICD-10-CM

## 2020-08-28 DIAGNOSIS — N952 Postmenopausal atrophic vaginitis: Secondary | ICD-10-CM

## 2020-08-31 ENCOUNTER — Ambulatory Visit (INDEPENDENT_AMBULATORY_CARE_PROVIDER_SITE_OTHER): Payer: Managed Care, Other (non HMO)

## 2020-08-31 ENCOUNTER — Telehealth: Payer: Self-pay | Admitting: *Deleted

## 2020-08-31 DIAGNOSIS — J309 Allergic rhinitis, unspecified: Secondary | ICD-10-CM

## 2020-08-31 NOTE — Telephone Encounter (Signed)
Please have the patient come in for lab work for [employer] (order placed). She needs this prior to getting a refill on her testosterone.

## 2020-08-31 NOTE — Telephone Encounter (Signed)
Medication refill request: Testosterone Last AEX:  09/12/19 JJ Next AEX: 10/29/20 Last MMG (if hormonal medication request): 03/19/20 Neg Refill authorized: today, please advise

## 2020-08-31 NOTE — Telephone Encounter (Signed)
PA done via cover my meds estradiol 10 mcg tablet, will wait for response from insurance company.

## 2020-08-31 NOTE — Telephone Encounter (Signed)
Medication refill request: Estradiol Last AEX:  09/12/19 JJ Next AEX: 10/29/20 Last MMG (if hormonal medication request): 03/19/20 Neg Refill authorized: Today, please advise

## 2020-09-02 ENCOUNTER — Other Ambulatory Visit: Payer: Self-pay

## 2020-09-02 ENCOUNTER — Other Ambulatory Visit: Payer: Managed Care, Other (non HMO)

## 2020-09-02 DIAGNOSIS — R6882 Decreased libido: Secondary | ICD-10-CM

## 2020-09-02 NOTE — Telephone Encounter (Signed)
Medication denied drug is not covered/plan exclusion. They did not give any other options of covered drugs. I discussed this with patient via my chart message and patient picked up for now and will discuss with Dr.Jertson and next appointment.

## 2020-09-04 ENCOUNTER — Other Ambulatory Visit: Payer: Managed Care, Other (non HMO)

## 2020-09-07 LAB — TESTOS,TOTAL,FREE AND SHBG (FEMALE)
Free Testosterone: 1.3 pg/mL (ref 0.1–6.4)
Sex Hormone Binding: 105 nmol/L — ABNORMAL HIGH (ref 14–73)
Testosterone, Total, LC-MS-MS: 21 ng/dL (ref 2–45)

## 2020-09-09 ENCOUNTER — Ambulatory Visit (INDEPENDENT_AMBULATORY_CARE_PROVIDER_SITE_OTHER): Payer: Managed Care, Other (non HMO)

## 2020-09-09 DIAGNOSIS — J309 Allergic rhinitis, unspecified: Secondary | ICD-10-CM | POA: Diagnosis not present

## 2020-09-09 MED ORDER — NONFORMULARY OR COMPOUNDED ITEM: 1 refills | Status: DC

## 2020-09-15 ENCOUNTER — Ambulatory Visit (INDEPENDENT_AMBULATORY_CARE_PROVIDER_SITE_OTHER): Payer: Managed Care, Other (non HMO)

## 2020-09-15 DIAGNOSIS — J309 Allergic rhinitis, unspecified: Secondary | ICD-10-CM | POA: Diagnosis not present

## 2020-09-21 ENCOUNTER — Ambulatory Visit (INDEPENDENT_AMBULATORY_CARE_PROVIDER_SITE_OTHER): Payer: Managed Care, Other (non HMO)

## 2020-09-21 DIAGNOSIS — J309 Allergic rhinitis, unspecified: Secondary | ICD-10-CM | POA: Diagnosis not present

## 2020-10-12 ENCOUNTER — Ambulatory Visit (INDEPENDENT_AMBULATORY_CARE_PROVIDER_SITE_OTHER): Payer: Managed Care, Other (non HMO)

## 2020-10-12 DIAGNOSIS — J309 Allergic rhinitis, unspecified: Secondary | ICD-10-CM

## 2020-10-29 ENCOUNTER — Ambulatory Visit: Payer: Managed Care, Other (non HMO) | Admitting: Obstetrics and Gynecology

## 2020-11-02 ENCOUNTER — Ambulatory Visit (INDEPENDENT_AMBULATORY_CARE_PROVIDER_SITE_OTHER): Payer: Managed Care, Other (non HMO)

## 2020-11-02 DIAGNOSIS — J309 Allergic rhinitis, unspecified: Secondary | ICD-10-CM | POA: Diagnosis not present

## 2020-11-13 ENCOUNTER — Other Ambulatory Visit: Payer: Self-pay

## 2020-11-13 ENCOUNTER — Encounter: Payer: Self-pay | Admitting: Obstetrics and Gynecology

## 2020-11-13 ENCOUNTER — Ambulatory Visit (INDEPENDENT_AMBULATORY_CARE_PROVIDER_SITE_OTHER): Payer: Managed Care, Other (non HMO) | Admitting: Obstetrics and Gynecology

## 2020-11-13 VITALS — BP 122/70 | HR 76 | Ht 61.25 in | Wt 137.0 lb

## 2020-11-13 DIAGNOSIS — Z9189 Other specified personal risk factors, not elsewhere classified: Secondary | ICD-10-CM

## 2020-11-13 DIAGNOSIS — Z8739 Personal history of other diseases of the musculoskeletal system and connective tissue: Secondary | ICD-10-CM | POA: Diagnosis not present

## 2020-11-13 DIAGNOSIS — N952 Postmenopausal atrophic vaginitis: Secondary | ICD-10-CM

## 2020-11-13 DIAGNOSIS — Z01419 Encounter for gynecological examination (general) (routine) without abnormal findings: Secondary | ICD-10-CM

## 2020-11-13 MED ORDER — RISEDRONATE SODIUM 150 MG PO TABS: 150.0000 mg | ORAL_TABLET | ORAL | 3 refills | Status: DC

## 2020-11-13 MED ORDER — ESTRADIOL 10 MCG VA TABS
ORAL_TABLET | VAGINAL | 3 refills | Status: DC
Start: 2020-11-13 — End: 2021-11-17

## 2020-11-13 NOTE — Patient Instructions (Signed)

## 2020-11-13 NOTE — Progress Notes (Signed)
60 y.o. G14P2002 Married White or Caucasian Hispanic or Latino female here for annual exam.  H/O TVH, then later laparoscopic BSO. Not on ERT, she is using vaginal estrogen which helps with vaginal comfort.  Sexually active, no pain if uses a lubricant and goes slow.  She stopped her testosterone cream, not really working.   No bowel or bladder c/o.   H/O osteoporosis, on actonel since 5/18.     No LMP recorded. Patient has had a hysterectomy.          Sexually active: Yes.    The current method of family planning is status post hysterectomy.    Exercising: Yes.    tennis, walking, golf Smoker:  no  Health Maintenance: Pap:  08/17/17 Neg  06/24/14 Neg History of abnormal Pap:  no MMG:  03/19/20 BIRADS 1 negative BMD:   02/08/19 Osteopenia, T score -2 (on Actonel); 3/18 T score -2.6, started Actonel in 5/18. Colonoscopy: 03/20/13 Normal f/u 10 years TDaP:  03/20/18 Gardasil: n/a   reports that she has never smoked. She has never used smokeless tobacco. She reports current alcohol use of about 2.0 standard drinks of alcohol per week. She reports that she does not use drugs. Homemaker. 2 daughters. One Daughter in in [place], she is a Neurosurgeon. Other daughter is local and has a baby boy (8 months). She watches him 2 x a week.    Past Medical History:  Diagnosis Date  . Acute appendicitis 09/14/2018  . Allergy   . Anemia   . Anxiety   . BRCA negative 12/2014  . Depression   . Endometriosis   . Esophagitis   . Fibroid   . GERD (gastroesophageal reflux disease)   . Hiatal hernia   . History of diverticulitis   . IC (interstitial cystitis)   . Osteoporosis 01/2019   2018 T score -2.6, 2020 T score -2.0 on Actonel  . Schatzki's ring     Past Surgical History:  Procedure Laterality Date  . CHOLECYSTECTOMY    . HYSTEROSCOPY  12/2003   RESECTON OF SUBMUCOUS  MYOMA/DX SCOPE  . LAPAROSCOPIC APPENDECTOMY N/A 09/14/2018   Procedure: APPENDECTOMY LAPAROSCOPIC;  Surgeon: Ileana Roup,  MD;  Location: [facility];  Service: General;  Laterality: N/A;  . LAPAROSCOPIC BILATERAL SALPINGO OOPHERECTOMY Bilateral 07/02/2014   Procedure: LAPAROSCOPIC BILATERAL SALPINGO OOPHORECTOMY;  Surgeon: Anastasio Auerbach, MD;  Location: [facility];  Service: Gynecology;  Laterality: Bilateral;  . PELVIC LAPAROSCOPY  12/2003   DX SCOPE/RESECTION OF SUBMUCOUS MYOMA  . TMJ ARTHROSCOPY  11-26-2001  . VAGINAL HYSTERECTOMY  12/05   Menorrhagia/dysmenorrhea. Pathology showed leiomyoma/adenomyosis    Current Outpatient Medications  Medication Sig Dispense Refill  . albuterol (PROVENTIL HFA;VENTOLIN HFA) 108 (90 Base) MCG/ACT inhaler Inhale 2 puffs into the lungs every 6 (six) hours as needed for wheezing or shortness of breath. 1 Inhaler 2  . buPROPion (WELLBUTRIN XL) 150 MG 24 hr tablet Take 150 mg by mouth 2 (two) times daily.    . Calcium Carbonate (CALCIUM 600 PO) Take 1 tablet by mouth daily.    Marland Kitchen EPINEPHrine 0.3 mg/0.3 mL IJ SOAJ injection Inject 0.3 mg into the muscle as needed for anaphylaxis.   2  . Estradiol 10 MCG TABS vaginal tablet INSERT 1 TABLET VAGINALLY EVERY NIGHT AT BEDTIME FOR 1 WEEK THEN TWICE WEEKLY AT BEDTIME. 24 tablet 0  . LORazepam (ATIVAN) 0.5 MG tablet Take 0.5 mg by mouth daily as needed for sleep.     Marland Kitchen MAGNESIUM  GLYCINATE PLUS PO Take 2 capsules by mouth at bedtime.    . mometasone (NASONEX) 50 MCG/ACT nasal spray SHAKE LIQUID AND USE 2 SPRAYS IN EACH NOSTRIL DAILY 17 g 5  . montelukast (SINGULAIR) 10 MG tablet TAKE 1 TABLET(10 MG) BY MOUTH AT BEDTIME 30 tablet 5  . Multiple Vitamin (MULTIVITAMIN) tablet Take 1 tablet by mouth 2 (two) times daily.    Marland Kitchen olopatadine (PATANOL) 0.1 % ophthalmic solution INSTILL 1 DROP IN BOTH EYES TWICE DAILY 5 mL 0  . Omega-3 Fatty Acids (OMEGAPURE 780 EC PO) Take 1 tablet by mouth daily.    . risedronate (ACTONEL) 150 MG tablet Take 1 tablet (150 mg total) by mouth every 30 (thirty) days. TAKE ONE TABLET(150MG) BY MOUTH EVERY 30 DAYS WITH WATER ON  EMPTY STOMAACH NOTHING BY MOUTH AND DON'T LIE DOWN FOR 30 MINUTES 3 tablet 2  . diclofenac Sodium (VOLTAREN) 1 % GEL Apply 2 g topically 3 (three) times daily. (Patient not taking: Reported on 11/13/2020) 50 g 0  . NONFORMULARY OR COMPOUNDED ITEM Compounded Testosterone Cream 0.75%. Apply 1 click, 3.14 grams daily to lower abdomen or thighs.  Dispense 22.5 grams One refill. (Patient not taking: Reported on 11/13/2020) 1 each 1   No current facility-administered medications for this visit.    Family History  Problem Relation Age of Onset  . Uterine cancer Maternal Grandmother        dx in her 76s  . Breast cancer Sister        paternal half sister dx in her early 41s  . Cystic fibrosis Other        brother's granddauther  . Emphysema Father   . Early death Father   . Breast cancer Sister        paternal half sister dx in her late 25s  . Cystic fibrosis Other        brothers granddaughters  . Depression Mother   . Hyperlipidemia Mother   . Hypertension Mother   . Mental illness Mother   . Miscarriages / [place] Mother   . Diabetes Brother 50       HALF BROTHER/HALF BROTHER   . Breast cancer Sister   . GI problems Brother   . Colon cancer Neg Hx   . Esophageal cancer Neg Hx   . Rectal cancer Neg Hx   . Stomach cancer Neg Hx   . Allergic rhinitis Neg Hx   . Asthma Neg Hx   . Eczema Neg Hx   . Urticaria Neg Hx     Review of Systems  Constitutional: Negative.   HENT: Negative.   Eyes: Negative.   Respiratory: Negative.   Cardiovascular: Negative.   Gastrointestinal: Negative.   Endocrine: Negative.   Genitourinary: Negative.   Musculoskeletal: Negative.   Skin: Negative.   Allergic/Immunologic: Negative.   Neurological: Negative.   Hematological: Negative.   Psychiatric/Behavioral: Negative.     Exam:   BP 122/70   Pulse 76   Ht 5' 1.25" (1.556 m)   Wt 137 lb (62.1 kg)   BMI 25.68 kg/m   Weight change: @WEIGHTCHANGE @ Height:   Height: 5' 1.25" (155.6 cm)   Ht Readings from Last 3 Encounters:  11/13/20 5' 1.25" (1.556 m)  06/15/20 5' 1"  (1.549 m)  05/18/20 5' 1"  (1.549 m)    General appearance: alert, cooperative and appears stated age Head: Normocephalic, without obvious abnormality, atraumatic Neck: no adenopathy, supple, symmetrical, trachea midline and thyroid normal to inspection and palpation Lungs: clear  to auscultation bilaterally Cardiovascular: regular rate and rhythm Breasts: normal appearance, no masses or tenderness Abdomen: soft, non-tender; non distended,  no masses,  no organomegaly Extremities: extremities normal, atraumatic, no cyanosis or edema Skin: Skin color, texture, turgor normal. No rashes or lesions Lymph nodes: Cervical, supraclavicular, and axillary nodes normal. No abnormal inguinal nodes palpated Neurologic: Grossly normal   Pelvic: External genitalia:  no lesions              Urethra:  normal appearing urethra with no masses, tenderness or lesions              Bartholins and Skenes: normal                 Vagina: normal appearing vagina with normal color and discharge, no lesions              Cervix: absent               Bimanual Exam:  Uterus:  uterus absent              Adnexa: no mass, fullness, tenderness               Rectovaginal: Confirms               Anus:  normal sphincter tone, no lesions  Terence Lux chaperoned for the exam.  1. Well woman exam No pap needed Mammogram in 7/22 Colonoscopy UTD  2. History of osteoporosis Osteopenia in 6/20 while on Actonel She has been on the Actonel for almost 4 years - risedronate (ACTONEL) 150 MG tablet; Take 1 tablet (150 mg total) by mouth every 30 (thirty) days. TAKE ONE TABLET(150MG) BY MOUTH EVERY 30 DAYS WITH WATER ON EMPTY STOMAACH NOTHING BY MOUTH AND DON'T LIE DOWN FOR 30 MINUTES  Dispense: 3 tablet; Refill: 3  3. Vaginal atrophy - Estradiol 10 MCG TABS vaginal tablet; INSERT 1 TABLET VAGINALLY EVERY NIGHT AT BEDTIME FOR 1 WEEK THEN  TWICE WEEKLY AT BEDTIME.  Dispense: 24 tablet; Refill: 3  4. At increased risk of breast cancer 15.9% Discussed abbreviated MRI, will schedule

## 2020-11-16 ENCOUNTER — Telehealth: Payer: Self-pay | Admitting: *Deleted

## 2020-11-16 DIAGNOSIS — Z9189 Other specified personal risk factors, not elsewhere classified: Secondary | ICD-10-CM

## 2020-11-16 NOTE — Telephone Encounter (Signed)
Left message for patient to call. Order placed.

## 2020-11-16 NOTE — Telephone Encounter (Signed)
-----   Message from Salvadore Dom, MD sent at 11/13/2020 11:55 AM EDT ----- Please schedule her for an abbreviated breast mri at [facility]. Risk of breast cancer is 15.9%. Thanks, [name]

## 2020-11-17 NOTE — Telephone Encounter (Signed)
Patient scheduled on 5/39/76 no Pre-cert. Needed since it is a abbreviated breast MRI, they do not file with insurance.

## 2020-11-24 ENCOUNTER — Ambulatory Visit (INDEPENDENT_AMBULATORY_CARE_PROVIDER_SITE_OTHER): Payer: Managed Care, Other (non HMO) | Admitting: Allergy and Immunology

## 2020-11-24 ENCOUNTER — Ambulatory Visit: Payer: Self-pay | Admitting: *Deleted

## 2020-11-24 ENCOUNTER — Other Ambulatory Visit: Payer: Self-pay

## 2020-11-24 ENCOUNTER — Encounter: Payer: Self-pay | Admitting: Allergy and Immunology

## 2020-11-24 VITALS — BP 124/80 | HR 81 | Temp 98.2°F | Resp 18 | Ht 61.0 in | Wt 136.8 lb

## 2020-11-24 DIAGNOSIS — J309 Allergic rhinitis, unspecified: Secondary | ICD-10-CM

## 2020-11-24 DIAGNOSIS — J301 Allergic rhinitis due to pollen: Secondary | ICD-10-CM

## 2020-11-24 DIAGNOSIS — T781XXD Other adverse food reactions, not elsewhere classified, subsequent encounter: Secondary | ICD-10-CM

## 2020-11-24 DIAGNOSIS — J4520 Mild intermittent asthma, uncomplicated: Secondary | ICD-10-CM | POA: Diagnosis not present

## 2020-11-24 MED ORDER — ALBUTEROL SULFATE HFA 108 (90 BASE) MCG/ACT IN AERS: 2.0000 | INHALATION_SPRAY | Freq: Four times a day (QID) | RESPIRATORY_TRACT | 5 refills | Status: DC | PRN

## 2020-11-24 MED ORDER — MOMETASONE FUROATE 50 MCG/ACT NA SUSP: NASAL | 10 refills | Status: DC

## 2020-11-24 MED ORDER — EPINEPHRINE 0.3 MG/0.3ML IJ SOAJ: 0.3000 mg | INTRAMUSCULAR | 2 refills | Status: DC | PRN

## 2020-11-24 NOTE — Progress Notes (Signed)
[facility]   Follow-up Note  Referring Provider: Nche, Charlene Brooke, NP Primary Provider: Flossie Buffy, NP Date of Office Visit: 11/24/2020  Subjective:   ARMELLA STOGNER (DOB: 1960-09-30) is a 60 y.o. female who returns to the [facility] on 11/24/2020 in re-evaluation of the following:  HPI: Aldean returns to this clinic in evaluation of asthma and allergic rhinitis and oral allergy syndrome.  Her last visit to this clinic was 26 November 2019.  Immunotherapy has resulted in dramatic improvement regarding all of her atopic respiratory tract and conjunctival issues. It is only during the spring and fall that she need to use some Nasonex and eyedrops for a month each season but otherwise does not really require much medication.  She has not used a short acting bronchodilator in greater than a year.  She has not required the administration of a systemic steroid or antibiotic for any type of airway issue.  She is currently utilizing her immunotherapy every 3 weeks without any adverse effect.  She has been able to consume most raw fruits and vegetables.  She can now consume half a banana without any problem.  She has not tried any apples.  She has received 3 Pfizer COVID vaccines and a flu vaccine.  Allergies as of 11/24/2020      Reactions   Honey Bee Treatment [bee Venom] Anaphylaxis   Eggs Or Egg-derived Products    Stomach ache   Methocarbamol    Peanut-containing Drug Products Other (See Comments)   Allergy testing   Sesame Seed (diagnostic) Other (See Comments)   Allergy test   Soy Allergy Other (See Comments)   Allergy test   Wheat Bran    Intolerance       Medication List      albuterol 108 (90 Base) MCG/ACT inhaler Commonly known as: VENTOLIN HFA Inhale 2 puffs into the lungs every 6 (six) hours as needed for wheezing or shortness of breath.   buPROPion 150 MG 24 hr tablet Commonly known as: WELLBUTRIN  XL Take 150 mg by mouth 2 (two) times daily.   CALCIUM 600 PO Take 1 tablet by mouth daily.   diclofenac Sodium 1 % Gel Commonly known as: Voltaren Apply 2 g topically 3 (three) times daily.   EPINEPHrine 0.3 mg/0.3 mL Soaj injection Commonly known as: EPI-PEN Inject 0.3 mg into the muscle as needed for anaphylaxis.   Estradiol 10 MCG Tabs vaginal tablet INSERT 1 TABLET VAGINALLY EVERY NIGHT AT BEDTIME FOR 1 WEEK THEN TWICE WEEKLY AT BEDTIME.   LORazepam 0.5 MG tablet Commonly known as: ATIVAN Take 0.5 mg by mouth daily as needed for sleep.   MAGNESIUM GLYCINATE PLUS PO Take 2 capsules by mouth at bedtime.   mometasone 50 MCG/ACT nasal spray Commonly known as: NASONEX SHAKE LIQUID AND USE 2 SPRAYS IN EACH NOSTRIL DAILY   montelukast 10 MG tablet Commonly known as: SINGULAIR TAKE 1 TABLET(10 MG) BY MOUTH AT BEDTIME   multivitamin tablet Take 1 tablet by mouth 2 (two) times daily.   olopatadine 0.1 % ophthalmic solution Commonly known as: PATANOL INSTILL 1 DROP IN BOTH EYES TWICE DAILY   OMEGAPURE 780 EC PO Take 1 tablet by mouth daily.   risedronate 150 MG tablet Commonly known as: ACTONEL Take 1 tablet (150 mg total) by mouth every 30 (thirty) days. TAKE ONE TABLET(150MG) BY MOUTH EVERY 30 DAYS WITH WATER ON EMPTY STOMAACH NOTHING BY MOUTH AND  DON'T LIE DOWN FOR 30 MINUTES       Past Medical History:  Diagnosis Date  . Acute appendicitis 09/14/2018  . Allergy   . Anemia   . Anxiety   . BRCA negative 12/2014  . Depression   . Endometriosis   . Esophagitis   . Fibroid   . GERD (gastroesophageal reflux disease)   . Hiatal hernia   . History of diverticulitis   . IC (interstitial cystitis)   . Osteoporosis 01/2019   2018 T score -2.6, 2020 T score -2.0 on Actonel  . Schatzki's ring     Past Surgical History:  Procedure Laterality Date  . CHOLECYSTECTOMY    . HYSTEROSCOPY  12/2003   RESECTON OF SUBMUCOUS  MYOMA/DX SCOPE  . LAPAROSCOPIC APPENDECTOMY  N/A 09/14/2018   Procedure: APPENDECTOMY LAPAROSCOPIC;  Surgeon: Ileana Roup, MD;  Location: [facility];  Service: General;  Laterality: N/A;  . LAPAROSCOPIC BILATERAL SALPINGO OOPHERECTOMY Bilateral 07/02/2014   Procedure: LAPAROSCOPIC BILATERAL SALPINGO OOPHORECTOMY;  Surgeon: Anastasio Auerbach, MD;  Location: [facility];  Service: Gynecology;  Laterality: Bilateral;  . PELVIC LAPAROSCOPY  12/2003   DX SCOPE/RESECTION OF SUBMUCOUS MYOMA  . TMJ ARTHROSCOPY  11-26-2001  . VAGINAL HYSTERECTOMY  12/05   Menorrhagia/dysmenorrhea. Pathology showed leiomyoma/adenomyosis    Review of systems negative except as noted in HPI / PMHx or noted below:  Review of Systems  Constitutional: Negative.   HENT: Negative.   Eyes: Negative.   Respiratory: Negative.   Cardiovascular: Negative.   Gastrointestinal: Negative.   Genitourinary: Negative.   Musculoskeletal: Negative.   Skin: Negative.   Neurological: Negative.   Endo/Heme/Allergies: Negative.   Psychiatric/Behavioral: Negative.      Objective:   Vitals:   11/24/20 1103  BP: 124/80  Pulse: 81  Resp: 18  Temp: 98.2 F (36.8 C)  SpO2: 99%   Height: 5' 1"  (154.9 cm)  Weight: 136 lb 12.8 oz (62.1 kg)   Physical Exam Constitutional:      Appearance: She is not diaphoretic.  HENT:     Head: Normocephalic.     Right Ear: Tympanic membrane, ear canal and external ear normal.     Left Ear: Tympanic membrane, ear canal and external ear normal.     Nose: Nose normal. No mucosal edema or rhinorrhea.     Mouth/Throat:     Pharynx: Uvula midline. No oropharyngeal exudate.  Eyes:     Conjunctiva/sclera: Conjunctivae normal.  Neck:     Thyroid: No thyromegaly.     Trachea: Trachea normal. No tracheal tenderness or tracheal deviation.  Cardiovascular:     Rate and Rhythm: Normal rate and regular rhythm.     Heart sounds: Normal heart sounds, S1 normal and S2 normal. No murmur heard.   Pulmonary:     Effort: No respiratory distress.      Breath sounds: Normal breath sounds. No stridor. No wheezing or rales.  Lymphadenopathy:     Head:     Right side of head: No tonsillar adenopathy.     Left side of head: No tonsillar adenopathy.     Cervical: No cervical adenopathy.  Skin:    Findings: No erythema or rash.     Nails: There is no clubbing.  Neurological:     Mental Status: She is alert.     Diagnostics: none  Assessment and Plan:   1. Asthma, mild intermittent, well-controlled   2. Seasonal allergic rhinitis due to pollen   3. Oral allergy syndrome, subsequent  encounter     1.  Continue immunotherapy.     2. Continue Nasonex 1-2 sprays each nostril 1-2 times per day during periods of upper airway symptoms  3.  If needed:   A.  Pro-air HFA or similar 2 inhalations every 4-6 hours.  B.  OTC Pataday  C.  EpiPen Cammie Sickle, Benadryl, MD/ER for severe allergic reaction  D.  OTC Antihistamine  4.  Return to clinic in 12 months or earlier if problem  Vicente Males continues to do very well with her immunotherapy which has changed her atopic phenotype significantly and we are going to continue to have her utilize this form of treatment for the next year along with other medications as noted above.  She will contact me during the interval should there be a problem.  Allena Katz, MD Allergy / Immunology [facility]

## 2020-11-24 NOTE — Patient Instructions (Signed)
  1.  Continue immunotherapy.     2. Continue Nasonex 1-2 sprays each nostril 1-2 times per day during periods of upper airway symptoms  3.  If needed:   A.  Pro-air HFA or similar 2 inhalations every 4-6 hours.  B.  OTC Pataday  C.  EpiPen Cammie Sickle, Benadryl, MD/ER for severe allergic reaction  D.  OTC Antihistamine  4.  Return to clinic in 12 months or earlier if problem

## 2020-11-24 NOTE — Telephone Encounter (Signed)
What is this about?

## 2020-11-24 NOTE — Telephone Encounter (Signed)
Last ov 06/15/2020 Chart supports Rx Last filled on 05/09/19 Yolanda [name])

## 2020-11-25 ENCOUNTER — Encounter: Payer: Self-pay | Admitting: Allergy and Immunology

## 2020-11-25 NOTE — Telephone Encounter (Signed)
Looks like rx was already sent by her allergist yesterday

## 2020-12-14 ENCOUNTER — Ambulatory Visit: Payer: Managed Care, Other (non HMO)

## 2020-12-14 ENCOUNTER — Ambulatory Visit (INDEPENDENT_AMBULATORY_CARE_PROVIDER_SITE_OTHER): Payer: Managed Care, Other (non HMO) | Admitting: *Deleted

## 2020-12-14 DIAGNOSIS — J309 Allergic rhinitis, unspecified: Secondary | ICD-10-CM | POA: Diagnosis not present

## 2020-12-28 ENCOUNTER — Encounter: Payer: Self-pay | Admitting: Obstetrics and Gynecology

## 2021-01-04 ENCOUNTER — Ambulatory Visit
Admission: RE | Admit: 2021-01-04 | Discharge: 2021-01-04 | Disposition: A | Discharge status: Home / Self Care | Payer: Managed Care, Other (non HMO) | Source: Ambulatory Visit | Attending: Obstetrics and Gynecology | Admitting: Obstetrics and Gynecology

## 2021-01-04 ENCOUNTER — Other Ambulatory Visit: Payer: Self-pay

## 2021-01-04 DIAGNOSIS — Z9189 Other specified personal risk factors, not elsewhere classified: Secondary | ICD-10-CM

## 2021-01-04 MED ORDER — GADOBUTROL 1 MMOL/ML IV SOLN
6.0000 mL | Freq: Once | INTRAVENOUS | Status: AC | PRN
  Administered 2021-01-04: 6 mL via INTRAVENOUS

## 2021-01-06 ENCOUNTER — Ambulatory Visit (INDEPENDENT_AMBULATORY_CARE_PROVIDER_SITE_OTHER): Payer: Managed Care, Other (non HMO)

## 2021-01-06 DIAGNOSIS — J309 Allergic rhinitis, unspecified: Secondary | ICD-10-CM

## 2021-01-07 ENCOUNTER — Telehealth: Payer: Self-pay | Admitting: Obstetrics and Gynecology

## 2021-01-07 NOTE — Telephone Encounter (Signed)
Please let the patient know that her mammogram is negative, but [facility] estimates her risk of breast cancer at 24.6%. The genetics center estimated her risk at 15.9%. I've sent a message to the genetic counselor asking her advice. [facility] lists that she has 42 half sisters with breast cancer, in Epic we only have 2. Please check this with her. Confirm they are all on her paternal side and the ages at diagnosis.  Thanks

## 2021-01-07 NOTE — Telephone Encounter (Signed)
Left detailed message in voice mail explaining to patient the information that is needed and why.

## 2021-01-10 NOTE — Telephone Encounter (Signed)
-----   Message from Clarene Essex, Counselor sent at 01/08/2021 10:00 AM EDT ----- Warden Fillers,  I just recalculated her risk and came up with an 8-10% lifetime risk, using her 2021 mammogram (I don't see the 2022), all of the stats from when we saw her, and both with competing mortality calculated and not.  Per our information, she does not seem to have a high BMI, her breast density is a category B, and she is now older than when her sisters had cancer.  I don't know how they got to 24.6%, but it does not seem that it should be that high.  I just don't know.   Maybe reaching out to them and asking would be helpful.  You can send her back to [place], we can offer updated testing, but I don't think that would be informative - I highly doubt that updated testing will find anything further.  We can recalculate her risk, but it will still be lower than what [facility] says.  I don't feel that I am being helpful, but I too am at a loss, as the numbers are so different.  Santiago Glad ----- Message ----- From: Salvadore Dom, MD Sent: 01/07/2021   1:41 PM EDT To: Clarene Essex, Counselor  Hi Santiago Glad, This patient saw you a few years ago and you accessed her TC risk of breast cancer at 15.9%. At the time of her mammogram, [facility] stated her risk is 24.6%. I never know what to do in this situation.  The only difference I see is that [facility] lists that she has 3 1/2 sisters with breast cancer instead of 2 (in Epic). [facility] lists the ages as 30, 36, and 105 Should I send her back to you? Thanks, [name]

## 2021-01-10 NOTE — Telephone Encounter (Signed)
Please let the patient know that the Genetic Counselor reran her #'s and now gets her lifetime risk at 8-10%, very different that the [facility] calculation. Please offer her another appointment with Genetics.

## 2021-01-11 ENCOUNTER — Other Ambulatory Visit: Payer: Self-pay

## 2021-01-11 DIAGNOSIS — M818 Other osteoporosis without current pathological fracture: Secondary | ICD-10-CM

## 2021-01-11 NOTE — Telephone Encounter (Signed)
Patient informed. She declined referral to Genetic testing.

## 2021-01-13 DIAGNOSIS — J301 Allergic rhinitis due to pollen: Secondary | ICD-10-CM | POA: Diagnosis not present

## 2021-01-13 NOTE — Progress Notes (Signed)
VIALS EXP 01-13-22

## 2021-01-14 ENCOUNTER — Other Ambulatory Visit: Payer: Self-pay | Admitting: Nurse Practitioner

## 2021-01-27 ENCOUNTER — Ambulatory Visit (INDEPENDENT_AMBULATORY_CARE_PROVIDER_SITE_OTHER): Payer: Managed Care, Other (non HMO)

## 2021-01-27 DIAGNOSIS — J309 Allergic rhinitis, unspecified: Secondary | ICD-10-CM | POA: Diagnosis not present

## 2021-02-16 ENCOUNTER — Other Ambulatory Visit: Payer: Self-pay

## 2021-02-16 ENCOUNTER — Ambulatory Visit (INDEPENDENT_AMBULATORY_CARE_PROVIDER_SITE_OTHER): Payer: Managed Care, Other (non HMO) | Admitting: *Deleted

## 2021-02-16 ENCOUNTER — Ambulatory Visit (INDEPENDENT_AMBULATORY_CARE_PROVIDER_SITE_OTHER): Payer: Managed Care, Other (non HMO)

## 2021-02-16 ENCOUNTER — Other Ambulatory Visit: Payer: Self-pay | Admitting: Obstetrics and Gynecology

## 2021-02-16 DIAGNOSIS — M8589 Other specified disorders of bone density and structure, multiple sites: Secondary | ICD-10-CM

## 2021-02-16 DIAGNOSIS — Z780 Asymptomatic menopausal state: Secondary | ICD-10-CM

## 2021-02-16 DIAGNOSIS — M818 Other osteoporosis without current pathological fracture: Secondary | ICD-10-CM

## 2021-02-16 DIAGNOSIS — J309 Allergic rhinitis, unspecified: Secondary | ICD-10-CM | POA: Diagnosis not present

## 2021-02-23 ENCOUNTER — Ambulatory Visit (INDEPENDENT_AMBULATORY_CARE_PROVIDER_SITE_OTHER): Payer: Managed Care, Other (non HMO) | Admitting: *Deleted

## 2021-02-23 DIAGNOSIS — J309 Allergic rhinitis, unspecified: Secondary | ICD-10-CM | POA: Diagnosis not present

## 2021-03-01 ENCOUNTER — Ambulatory Visit (INDEPENDENT_AMBULATORY_CARE_PROVIDER_SITE_OTHER): Payer: Managed Care, Other (non HMO)

## 2021-03-01 ENCOUNTER — Encounter: Payer: Self-pay | Admitting: Obstetrics and Gynecology

## 2021-03-01 DIAGNOSIS — J309 Allergic rhinitis, unspecified: Secondary | ICD-10-CM | POA: Diagnosis not present

## 2021-03-08 ENCOUNTER — Ambulatory Visit (INDEPENDENT_AMBULATORY_CARE_PROVIDER_SITE_OTHER): Payer: Managed Care, Other (non HMO) | Admitting: *Deleted

## 2021-03-08 DIAGNOSIS — J309 Allergic rhinitis, unspecified: Secondary | ICD-10-CM | POA: Diagnosis not present

## 2021-03-17 ENCOUNTER — Ambulatory Visit (INDEPENDENT_AMBULATORY_CARE_PROVIDER_SITE_OTHER): Payer: Managed Care, Other (non HMO) | Admitting: *Deleted

## 2021-03-17 DIAGNOSIS — J309 Allergic rhinitis, unspecified: Secondary | ICD-10-CM | POA: Diagnosis not present

## 2021-04-05 ENCOUNTER — Ambulatory Visit (INDEPENDENT_AMBULATORY_CARE_PROVIDER_SITE_OTHER): Payer: Managed Care, Other (non HMO) | Admitting: Family Medicine

## 2021-04-05 ENCOUNTER — Other Ambulatory Visit: Payer: Self-pay

## 2021-04-05 ENCOUNTER — Ambulatory Visit (INDEPENDENT_AMBULATORY_CARE_PROVIDER_SITE_OTHER): Payer: Managed Care, Other (non HMO)

## 2021-04-05 ENCOUNTER — Encounter: Payer: Self-pay | Admitting: Family Medicine

## 2021-04-05 VITALS — BP 122/74 | HR 77 | Temp 97.8°F | Ht 61.0 in | Wt 136.4 lb

## 2021-04-05 DIAGNOSIS — M7751 Other enthesopathy of right foot: Secondary | ICD-10-CM

## 2021-04-05 DIAGNOSIS — J309 Allergic rhinitis, unspecified: Secondary | ICD-10-CM

## 2021-04-05 MED ORDER — COLCHICINE 0.6 MG PO TABS: 0.6000 mg | ORAL_TABLET | Freq: Every day | ORAL | 0 refills | Status: DC

## 2021-04-05 MED ORDER — INDOMETHACIN 50 MG PO CAPS: 50.0000 mg | ORAL_CAPSULE | Freq: Three times a day (TID) | ORAL | 1 refills | Status: DC

## 2021-04-05 NOTE — Progress Notes (Signed)
Established Patient Office Visit  Subjective:  Patient ID: Yolanda [name], female    DOB: 1960-11-03  Age: 60 y.o. MRN: [id number]  CC:  Chief Complaint  Patient presents with   Pain    Right big toe pain started last night.     HPI [name] presents for 1 day history of pain at the base of her right great toe.  It woke her from sleep last night.  It was painful just to have the sheet touching it.  While she was playing tennis yesterday, there was no injury.  No family history of gout.  She endorses 2 glasses of wine weekly.  Past Medical History:  Diagnosis Date   Acute appendicitis 09/14/2018   Allergy    Anemia    Anxiety    BRCA negative 12/2014   Depression    Endometriosis    Esophagitis    Fibroid    GERD (gastroesophageal reflux disease)    Hiatal hernia    History of diverticulitis    IC (interstitial cystitis)    Osteoporosis 01/2019   2018 T score -2.6, 2020 T score -2.0 on Actonel   Schatzki's ring     Past Surgical History:  Procedure Laterality Date   CHOLECYSTECTOMY     HYSTEROSCOPY  12/2003   RESECTON OF SUBMUCOUS  MYOMA/DX SCOPE   LAPAROSCOPIC APPENDECTOMY N/A 09/14/2018   Procedure: APPENDECTOMY LAPAROSCOPIC;  Surgeon: Ileana Roup, MD;  Location: [facility];  Service: General;  Laterality: N/A;   LAPAROSCOPIC BILATERAL SALPINGO OOPHERECTOMY Bilateral 07/02/2014   Procedure: LAPAROSCOPIC BILATERAL SALPINGO OOPHORECTOMY;  Surgeon: Anastasio Auerbach, MD;  Location: [facility];  Service: Gynecology;  Laterality: Bilateral;   PELVIC LAPAROSCOPY  12/2003   DX SCOPE/RESECTION OF SUBMUCOUS MYOMA   TMJ ARTHROSCOPY  11-26-2001   VAGINAL HYSTERECTOMY  12/05   Menorrhagia/dysmenorrhea. Pathology showed leiomyoma/adenomyosis    Family History  Problem Relation Age of Onset   Uterine cancer Maternal Grandmother        dx in her 64s   Breast cancer Sister        paternal half sister dx in her early 82s   Cystic fibrosis Other        brother's  granddauther   Emphysema Father    Early death Father    Breast cancer Sister        paternal half sister dx in her late 34s   Cystic fibrosis Other        brothers granddaughters   Depression Mother    Hyperlipidemia Mother    Hypertension Mother    Mental illness Mother    Miscarriages / [place] Mother    Diabetes Brother 74       HALF BROTHER/HALF BROTHER    Breast cancer Sister    GI problems Brother    Colon cancer Neg Hx    Esophageal cancer Neg Hx    Rectal cancer Neg Hx    Stomach cancer Neg Hx    Allergic rhinitis Neg Hx    Asthma Neg Hx    Eczema Neg Hx    Urticaria Neg Hx     Social History   Socioeconomic History   Marital status: Married    Spouse name: Not on file   Number of children: 2   Years of education: Not on file   Highest education level: Not on file  Occupational History   Not on file  Tobacco Use   Smoking status: Never   Smokeless  tobacco: Never  Vaping Use   Vaping Use: Never used  Substance and Sexual Activity   Alcohol use: Yes    Alcohol/week: 2.0 standard drinks    Types: 2 Glasses of wine per week    Comment: week   Drug use: No   Sexual activity: Yes    Birth control/protection: Surgical    Comment: HYST-1st intercourse 60 yo-Fewer than 5 partners  Other Topics Concern   Not on file  Social History Narrative   Not on file   Social Determinants of Health   Financial Resource Strain: Not on file  Food Insecurity: Not on file  Transportation Needs: Not on file  Physical Activity: Not on file  Stress: Not on file  Social Connections: Not on file  Intimate Partner Violence: Not on file    Outpatient Medications Prior to Visit  Medication Sig Dispense Refill   albuterol (VENTOLIN HFA) 108 (90 Base) MCG/ACT inhaler Inhale 2 puffs into the lungs every 6 (six) hours as needed for wheezing or shortness of breath. 1 each 5   buPROPion (WELLBUTRIN XL) 150 MG 24 hr tablet Take 150 mg by mouth 2 (two) times daily.      Calcium Carbonate (CALCIUM 600 PO) Take 1 tablet by mouth daily.     EPINEPHrine 0.3 mg/0.3 mL IJ SOAJ injection Inject 0.3 mg into the muscle as needed for anaphylaxis. 2 each 2   Estradiol 10 MCG TABS vaginal tablet INSERT 1 TABLET VAGINALLY EVERY NIGHT AT BEDTIME FOR 1 WEEK THEN TWICE WEEKLY AT BEDTIME. 24 tablet 3   LORazepam (ATIVAN) 0.5 MG tablet Take 0.5 mg by mouth daily as needed for sleep.      MAGNESIUM GLYCINATE PLUS PO Take 2 capsules by mouth at bedtime.     mometasone (NASONEX) 50 MCG/ACT nasal spray SHAKE LIQUID AND USE 2 SPRAYS IN EACH NOSTRIL DAILY 17 g 10   montelukast (SINGULAIR) 10 MG tablet TAKE 1 TABLET(10 MG) BY MOUTH AT BEDTIME 30 tablet 5   Multiple Vitamin (MULTIVITAMIN) tablet Take 1 tablet by mouth 2 (two) times daily.     olopatadine (PATANOL) 0.1 % ophthalmic solution INSTILL 1 DROP IN BOTH EYES TWICE DAILY 5 mL 0   Omega-3 Fatty Acids (OMEGAPURE 780 EC PO) Take 1 tablet by mouth daily.     risedronate (ACTONEL) 150 MG tablet Take 1 tablet (150 mg total) by mouth every 30 (thirty) days. TAKE ONE TABLET(150MG) BY MOUTH EVERY 30 DAYS WITH WATER ON EMPTY STOMAACH NOTHING BY MOUTH AND DON'T LIE DOWN FOR 30 MINUTES 3 tablet 3   diclofenac Sodium (VOLTAREN) 1 % GEL Apply 2 g topically 3 (three) times daily. 50 g 0   No facility-administered medications prior to visit.    Allergies  Allergen Reactions   Honey Bee Treatment [Bee Venom] Anaphylaxis   Eggs Or Egg-Derived Products     Stomach ache   Methocarbamol    Peanut-Containing Drug Products Other (See Comments)    Allergy testing   Sesame Seed (Diagnostic) Other (See Comments)    Allergy test   Soy Allergy Other (See Comments)    Allergy test   Wheat Bran     Intolerance     ROS Review of Systems  Constitutional:  Negative for chills, diaphoresis, fatigue, fever and unexpected weight change.  Respiratory: Negative.    Cardiovascular: Negative.   Gastrointestinal: Negative.   Musculoskeletal:  Positive  for arthralgias.  Psychiatric/Behavioral: Negative.       Objective:    Physical  Exam Vitals and nursing note reviewed.  Constitutional:      General: She is not in acute distress.    Appearance: Normal appearance. She is normal weight. She is not ill-appearing, toxic-appearing or diaphoretic.  Eyes:     General: No scleral icterus.       Right eye: No discharge.        Left eye: No discharge.     Conjunctiva/sclera: Conjunctivae normal.  Cardiovascular:     Pulses:          Dorsalis pedis pulses are 2+ on the right side.       Posterior tibial pulses are 2+ on the right side.  Pulmonary:     Effort: Pulmonary effort is normal.  Musculoskeletal:     Right foot: Tenderness and bony tenderness present.       Legs:  Neurological:     Mental Status: She is alert and oriented to person, place, and time.  Psychiatric:        Mood and Affect: Mood normal.        Behavior: Behavior normal.    BP 122/74 (BP Location: Right Arm, Patient Position: Sitting, Cuff Size: Normal)   Pulse 77   Temp 97.8 F (36.6 C) (Temporal)   Ht 5' 1"  (1.549 m)   Wt 136 lb 6.4 oz (61.9 kg)   SpO2 99%   BMI 25.77 kg/m  Wt Readings from Last 3 Encounters:  04/05/21 136 lb 6.4 oz (61.9 kg)  11/24/20 136 lb 12.8 oz (62.1 kg)  11/13/20 137 lb (62.1 kg)     Health Maintenance Due  Topic Date Due   Zoster Vaccines- Shingrix (1 of 2) Never done   PAP SMEAR-Modifier  08/17/2020   COVID-19 Vaccine (4 - Booster for Pfizer series) 11/03/2020   INFLUENZA VACCINE  03/22/2021    There are no preventive care reminders to display for this patient.  Lab Results  Component Value Date   TSH 1.31 03/20/2018   Lab Results  Component Value Date   WBC 6.1 09/12/2019   HGB 12.8 09/12/2019   HCT 38.5 09/12/2019   MCV 85 09/12/2019   PLT 238 09/12/2019   Lab Results  Component Value Date   NA 143 09/12/2019   K 3.9 09/12/2019   CO2 24 09/12/2019   GLUCOSE 78 09/12/2019   BUN 14 09/12/2019    CREATININE 0.70 09/12/2019   BILITOT 0.3 06/15/2020   ALKPHOS 91 06/15/2020   AST 17 06/15/2020   ALT 15 06/15/2020   PROT 7.1 06/15/2020   ALBUMIN 4.4 06/15/2020   CALCIUM 9.5 09/12/2019   GFR 74.39 09/14/2018   Lab Results  Component Value Date   CHOL 125 09/12/2019   Lab Results  Component Value Date   HDL 52 09/12/2019   Lab Results  Component Value Date   LDLCALC 61 09/12/2019   Lab Results  Component Value Date   TRIG 52 09/12/2019   Lab Results  Component Value Date   CHOLHDL 2.4 09/12/2019   Lab Results  Component Value Date   HGBA1C 5.1 09/12/2019      Assessment & Plan:   Problem List Items Addressed This Visit   None Visit Diagnoses     Capsulitis of metatarsophalangeal (MTP) joint of right foot    -  Primary   Relevant Medications   indomethacin (INDOCIN) 50 MG capsule   colchicine 0.6 MG tablet   Other Relevant Orders   DG Foot Complete Right   Uric acid  Meds ordered this encounter  Medications   indomethacin (INDOCIN) 50 MG capsule    Sig: Take 1 capsule (50 mg total) by mouth 3 (three) times daily with meals.    Dispense:  30 capsule    Refill:  1   colchicine 0.6 MG tablet    Sig: Take 1 tablet (0.6 mg total) by mouth daily.    Dispense:  30 tablet    Refill:  0    Follow-up: Return in about 1 week (around 04/12/2021), or if symptoms worsen or fail to improve.  Question gout versus injury of first MTP.  Treating for gout would be helpful in either.  Libby Maw, MD

## 2021-04-06 LAB — URIC ACID: Uric Acid, Serum: 5 mg/dL (ref 2.4–7.0)

## 2021-04-07 ENCOUNTER — Telehealth: Payer: Self-pay

## 2021-04-07 NOTE — Telephone Encounter (Signed)
Patient aware of message below.

## 2021-04-07 NOTE — Telephone Encounter (Signed)
Called patient went over lab and xray results patient verbally understood both and would like to know if she should continue taking both of the medications that were prescribed for the issue with her toe. Per patient the pain have subsided. Please advise

## 2021-04-28 ENCOUNTER — Ambulatory Visit (INDEPENDENT_AMBULATORY_CARE_PROVIDER_SITE_OTHER): Payer: Managed Care, Other (non HMO)

## 2021-04-28 DIAGNOSIS — J309 Allergic rhinitis, unspecified: Secondary | ICD-10-CM | POA: Diagnosis not present

## 2021-05-17 ENCOUNTER — Ambulatory Visit (INDEPENDENT_AMBULATORY_CARE_PROVIDER_SITE_OTHER): Payer: Managed Care, Other (non HMO)

## 2021-05-17 DIAGNOSIS — J309 Allergic rhinitis, unspecified: Secondary | ICD-10-CM | POA: Diagnosis not present

## 2021-06-07 ENCOUNTER — Ambulatory Visit (INDEPENDENT_AMBULATORY_CARE_PROVIDER_SITE_OTHER): Payer: Managed Care, Other (non HMO)

## 2021-06-07 DIAGNOSIS — J309 Allergic rhinitis, unspecified: Secondary | ICD-10-CM

## 2021-06-15 ENCOUNTER — Other Ambulatory Visit: Payer: Self-pay

## 2021-06-16 ENCOUNTER — Encounter: Payer: Self-pay | Admitting: Nurse Practitioner

## 2021-06-16 ENCOUNTER — Ambulatory Visit (INDEPENDENT_AMBULATORY_CARE_PROVIDER_SITE_OTHER): Payer: Managed Care, Other (non HMO) | Admitting: Nurse Practitioner

## 2021-06-16 VITALS — BP 104/72 | HR 100 | Temp 97.7°F | Ht 61.0 in | Wt 139.6 lb

## 2021-06-16 DIAGNOSIS — Z23 Encounter for immunization: Secondary | ICD-10-CM | POA: Diagnosis not present

## 2021-06-16 DIAGNOSIS — Z13220 Encounter for screening for lipoid disorders: Secondary | ICD-10-CM | POA: Diagnosis not present

## 2021-06-16 DIAGNOSIS — Z0001 Encounter for general adult medical examination with abnormal findings: Secondary | ICD-10-CM

## 2021-06-16 DIAGNOSIS — Z136 Encounter for screening for cardiovascular disorders: Secondary | ICD-10-CM | POA: Diagnosis not present

## 2021-06-16 LAB — LIPID PANEL
Cholesterol: 128 mg/dL (ref 0–200)
HDL: 58.6 mg/dL (ref >39.00)
LDL Cholesterol: 61 mg/dL (ref 0–99)
NonHDL: 69.82
Total CHOL/HDL Ratio: 2
Triglycerides: 42 mg/dL (ref 0.0–149.0)
VLDL: 8.4 mg/dL (ref 0.0–40.0)

## 2021-06-16 LAB — COMPREHENSIVE METABOLIC PANEL
ALT: 14 U/L (ref 0–35)
AST: 17 U/L (ref 0–37)
Albumin: 4.3 g/dL (ref 3.5–5.2)
Alkaline Phosphatase: 94 U/L (ref 39–117)
BUN: 18 mg/dL (ref 6–23)
CO2: 30 mEq/L (ref 19–32)
Calcium: 9.1 mg/dL (ref 8.4–10.5)
Chloride: 104 mEq/L (ref 96–112)
Creatinine, Ser: 0.69 mg/dL (ref 0.40–1.20)
GFR: 95.91 mL/min (ref >60.00)
Glucose, Bld: 77 mg/dL (ref 70–99)
Potassium: 4 mEq/L (ref 3.5–5.1)
Sodium: 141 mEq/L (ref 135–145)
Total Bilirubin: 0.3 mg/dL (ref 0.2–1.2)
Total Protein: 6.8 g/dL (ref 6.0–8.3)

## 2021-06-16 LAB — CBC
HCT: 38.3 % (ref 36.0–46.0)
Hemoglobin: 12.5 g/dL (ref 12.0–15.0)
MCHC: 32.7 g/dL (ref 30.0–36.0)
MCV: 85.3 fl (ref 78.0–100.0)
Platelets: 236 10*3/uL (ref 150.0–400.0)
RBC: 4.49 Mil/uL (ref 3.87–5.11)
RDW: 13.7 % (ref 11.5–15.5)
WBC: 7.4 10*3/uL (ref 4.0–10.5)

## 2021-06-16 LAB — TSH: TSH: 1.74 u[IU]/mL (ref 0.35–5.50)

## 2021-06-16 NOTE — Progress Notes (Signed)
Subjective:    Patient ID: Yolanda [name], female    DOB: 1961/01/31, 60 y.o.   MRN: [id number]  Patient presents today for CPE  HPI  Skin cancer screen by dermatology: [facility]  Vision:up to date Dental:up to date Diet: Exercise:walking and tennies 3x/week Weight:  Wt Readings from Last 3 Encounters:  06/16/21 139 lb 9.6 oz (63.3 kg)  04/05/21 136 lb 6.4 oz (61.9 kg)  11/24/20 136 lb 12.8 oz (62.1 kg)    Sexual History (orientation,birth control, marital status, STD):pelvic and breast exam completed by GYN  Depression/Suicide: Depression screen [facility] 2/9 06/16/2021 04/05/2021 06/15/2020 04/24/2018  Decreased Interest 0 0 0 0  Down, Depressed, Hopeless 0 0 0 0  PHQ - 2 Score 0 0 0 0  Altered sleeping 1 - - 1  Tired, decreased energy 0 - - 0  Change in appetite 0 - - 0  Feeling bad or failure about yourself  0 - - 0  Trouble concentrating 0 - - 0  Moving slowly or fidgety/restless 0 - - 0  Suicidal thoughts 0 - - 0  PHQ-9 Score 1 - - 1  Difficult doing work/chores Not difficult at all - - Not difficult at all   Immunizations: (TDAP, Hep C screen, Pneumovax, Influenza, zoster)  Health Maintenance  Topic Date Due   Pneumococcal Vaccination (1 - PCV) Never done   Pap Smear  08/17/2020   COVID-19 Vaccine (4 - Booster for Pfizer series) 08/31/2020   Zoster (Shingles) Vaccine (2 of 2) 08/11/2021   Mammogram  12/29/2022   Colon Cancer Screening  03/21/2023   Tetanus Vaccine  03/20/2028   Flu Shot  Completed   Hepatitis C Screening: USPSTF Recommendation to screen - Ages 18-79 yo.  Completed   HIV Screening  Completed   HPV Vaccine  Aged Out   Fall Risk: Fall Risk  06/16/2021 04/05/2021 10/10/2019  Falls in the past year? 0 0 0  Number falls in past yr: 0 - -  Injury with Fall? 0 - -  Risk for fall due to : No Fall Risks - -  Follow up Falls evaluation completed - Falls evaluation completed   Advanced Directive:will send copy Advanced Directives 06/16/2021   Does Patient Have a Medical Advance Directive? Yes  Type of Advance Directive Living will  Would patient like information on creating a medical advance directive? -    Medications and allergies reviewed with patient and updated if appropriate.  Patient Active Problem List   Diagnosis Date Noted   Medial epicondylitis of elbow, left 06/15/2020   Renal cyst, right 09/19/2019   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/18/2019   Lumbar radiculopathy 09/09/2019   Low back pain 05/27/2019   Closed fracture of coccyx ([place]) 12/04/2018   Well woman exam without gynecological exam 03/20/2018   Food allergy 03/20/2018   SOB (shortness of breath) on exertion 03/20/2018   Genetic testing 12/23/2014   Family history of breast cancer    Family history of uterine cancer    Osteoporosis 10/04/2014   Pancreatic duct dilated 05/10/2012   IBS 08/01/2008   UNS ADVRS EFF UNS RX MEDICINAL&BIOLOGICAL SBSTNC 01/21/2008   Depression, major, single episode, complete remission ([place]) 01/18/2008   RHINITIS, CHRONIC 01/18/2008    Current Outpatient Medications on File Prior to Visit  Medication Sig Dispense Refill   albuterol (VENTOLIN HFA) 108 (90 Base) MCG/ACT inhaler Inhale 2 puffs into the lungs every 6 (six) hours as needed for wheezing or shortness of  breath. 1 each 5   buPROPion (WELLBUTRIN XL) 150 MG 24 hr tablet Take 150 mg by mouth 2 (two) times daily.     Calcium Carbonate (CALCIUM 600 PO) Take 1 tablet by mouth daily.     colchicine 0.6 MG tablet Take 1 tablet (0.6 mg total) by mouth daily. 30 tablet 0   EPINEPHrine 0.3 mg/0.3 mL IJ SOAJ injection Inject 0.3 mg into the muscle as needed for anaphylaxis. 2 each 2   Estradiol 10 MCG TABS vaginal tablet INSERT 1 TABLET VAGINALLY EVERY NIGHT AT BEDTIME FOR 1 WEEK THEN TWICE WEEKLY AT BEDTIME. 24 tablet 3   LORazepam (ATIVAN) 0.5 MG tablet Take 0.5 mg by mouth daily as needed for sleep.      MAGNESIUM GLYCINATE PLUS PO Take 2 capsules by mouth at  bedtime.     mometasone (NASONEX) 50 MCG/ACT nasal spray SHAKE LIQUID AND USE 2 SPRAYS IN EACH NOSTRIL DAILY 17 g 10   montelukast (SINGULAIR) 10 MG tablet TAKE 1 TABLET(10 MG) BY MOUTH AT BEDTIME 30 tablet 5   Multiple Vitamin (MULTIVITAMIN) tablet Take 1 tablet by mouth 2 (two) times daily.     olopatadine (PATANOL) 0.1 % ophthalmic solution INSTILL 1 DROP IN BOTH EYES TWICE DAILY 5 mL 0   Omega-3 Fatty Acids (OMEGAPURE 780 EC PO) Take 1 tablet by mouth daily.     risedronate (ACTONEL) 150 MG tablet Take 1 tablet (150 mg total) by mouth every 30 (thirty) days. TAKE ONE TABLET(150MG) BY MOUTH EVERY 30 DAYS WITH WATER ON EMPTY STOMAACH NOTHING BY MOUTH AND DON'T LIE DOWN FOR 30 MINUTES 3 tablet 3   No current facility-administered medications on file prior to visit.    Past Medical History:  Diagnosis Date   Acute appendicitis 09/14/2018   Allergy    Anemia    Anxiety    BRCA negative 12/2014   Depression    Endometriosis    Esophagitis    Fibroid    GERD (gastroesophageal reflux disease)    Hiatal hernia    History of diverticulitis    IC (interstitial cystitis)    Osteoporosis 01/2019   2018 T score -2.6, 2020 T score -2.0 on Actonel   Schatzki's ring     Past Surgical History:  Procedure Laterality Date   CHOLECYSTECTOMY     HYSTEROSCOPY  12/2003   RESECTON OF SUBMUCOUS  MYOMA/DX SCOPE   LAPAROSCOPIC APPENDECTOMY N/A 09/14/2018   Procedure: APPENDECTOMY LAPAROSCOPIC;  Surgeon: Ileana Roup, MD;  Location: [facility];  Service: General;  Laterality: N/A;   LAPAROSCOPIC BILATERAL SALPINGO OOPHERECTOMY Bilateral 07/02/2014   Procedure: LAPAROSCOPIC BILATERAL SALPINGO OOPHORECTOMY;  Surgeon: Anastasio Auerbach, MD;  Location: [facility];  Service: Gynecology;  Laterality: Bilateral;   PELVIC LAPAROSCOPY  12/2003   DX SCOPE/RESECTION OF SUBMUCOUS MYOMA   TMJ ARTHROSCOPY  11-26-2001   VAGINAL HYSTERECTOMY  12/05   Menorrhagia/dysmenorrhea. Pathology showed leiomyoma/adenomyosis    Social History   Socioeconomic History   Marital status: Married    Spouse name: Not on file   Number of children: 2   Years of education: Not on file   Highest education level: Not on file  Occupational History   Not on file  Tobacco Use   Smoking status: Never   Smokeless tobacco: Never  Vaping Use   Vaping Use: Never used  Substance and Sexual Activity   Alcohol use: Yes    Alcohol/week: 2.0 standard drinks    Types: 2 Glasses of wine per  week    Comment: week   Drug use: No   Sexual activity: Yes    Birth control/protection: Surgical    Comment: HYST-1st intercourse 60 yo-Fewer than 5 partners  Other Topics Concern   Not on file  Social History Narrative   Not on file   Social Determinants of Health   Financial Resource Strain: Not on file  Food Insecurity: Not on file  Transportation Needs: Not on file  Physical Activity: Not on file  Stress: Not on file  Social Connections: Not on file   Family History  Problem Relation Age of Onset   Uterine cancer Maternal Grandmother        dx in her 28s   Breast cancer Sister        paternal half sister dx in her early 12s   Cystic fibrosis Other        brother's granddauther   Emphysema Father    Early death Father    Breast cancer Sister        paternal half sister dx in her late 84s   Cystic fibrosis Other        brothers granddaughters   Depression Mother    Hyperlipidemia Mother    Hypertension Mother    Mental illness Mother    Miscarriages / [place] Mother    Diabetes Brother 49       HALF BROTHER/HALF BROTHER    Breast cancer Sister    GI problems Brother    Colon cancer Neg Hx    Esophageal cancer Neg Hx    Rectal cancer Neg Hx    Stomach cancer Neg Hx    Allergic rhinitis Neg Hx    Asthma Neg Hx    Eczema Neg Hx    Urticaria Neg Hx        Review of Systems  Constitutional:  Negative for fever, malaise/fatigue and weight loss.  HENT:  Negative for congestion and sore throat.   Eyes:         Negative for visual changes  Respiratory:  Negative for cough and shortness of breath.   Cardiovascular:  Negative for chest pain, palpitations and leg swelling.  Gastrointestinal:  Negative for blood in stool, constipation, diarrhea and heartburn.  Genitourinary:  Negative for dysuria, frequency and urgency.  Musculoskeletal:  Negative for falls, joint pain and myalgias.  Skin:  Negative for rash.  Neurological:  Negative for dizziness, sensory change and headaches.  Endo/Heme/Allergies:  Does not bruise/bleed easily.  Psychiatric/Behavioral:  Negative for depression, hallucinations, substance abuse and suicidal ideas. The patient is not nervous/anxious and does not have insomnia.    Objective:   Vitals:   06/16/21 0812  BP: 104/72  Pulse: 100  Temp: 97.7 F (36.5 C)  SpO2: 100%   Body mass index is 26.38 kg/m.  Physical Examination:  Physical Exam Vitals reviewed.  Constitutional:      Appearance: She is well-developed.  HENT:     Right Ear: Tympanic membrane, ear canal and external ear normal.     Left Ear: Tympanic membrane, ear canal and external ear normal.     Mouth/Throat:     Pharynx: No oropharyngeal exudate.  Eyes:     Conjunctiva/sclera: Conjunctivae normal.     Pupils: Pupils are equal, round, and reactive to light.  Cardiovascular:     Rate and Rhythm: Normal rate and regular rhythm.     Pulses: Normal pulses.     Heart sounds: Normal heart sounds.  Pulmonary:  Effort: Pulmonary effort is normal.     Breath sounds: Normal breath sounds.  Chest:     Chest wall: No tenderness.  Abdominal:     General: Bowel sounds are normal.     Palpations: Abdomen is soft.  Genitourinary:    Comments: Deferred breast and pelvic exam to GYN Musculoskeletal:        General: Normal range of motion.     Cervical back: Normal range of motion and neck supple.     Right lower leg: No edema.     Left lower leg: No edema.  Lymphadenopathy:     Cervical: No  cervical adenopathy.  Skin:    General: Skin is warm and dry.  Neurological:     Mental Status: She is alert and oriented to person, place, and time.     Deep Tendon Reflexes: Reflexes are normal and symmetric.  Psychiatric:        Mood and Affect: Mood normal.        Behavior: Behavior normal.        Thought Content: Thought content normal.   ASSESSMENT and PLAN: This visit occurred during the SARS-CoV-2 public health emergency.  Safety protocols were in place, including screening questions prior to the visit, additional usage of staff PPE, and extensive cleaning of exam room while observing appropriate contact time as indicated for disinfecting solutions.   Mel was seen today for annual exam.  Diagnoses and all orders for this visit:  Encounter for preventative adult health care exam with abnormal findings -     Comprehensive metabolic panel -     Lipid panel -     TSH -     CBC  Encounter for lipid screening for cardiovascular disease -     Lipid panel  Flu vaccine need -     Flu Vaccine QUAD 6+ mos PF IM (Fluarix Quad PF)  Need for shingles vaccine -     Varicella-zoster vaccine IM     Problem List Items Addressed This Visit   None Visit Diagnoses     Encounter for preventative adult health care exam with abnormal findings    -  Primary   Relevant Orders   Comprehensive metabolic panel (Completed)   Lipid panel (Completed)   TSH (Completed)   CBC (Completed)   Encounter for lipid screening for cardiovascular disease       Relevant Orders   Lipid panel (Completed)   Flu vaccine need       Relevant Orders   Flu Vaccine QUAD 6+ mos PF IM (Fluarix Quad PF) (Completed)   Need for shingles vaccine       Relevant Orders   Varicella-zoster vaccine IM (Completed)       Follow up: Return in about 1 year (around 06/16/2022) for CPE (fasting).  Wilfred Lacy, NP

## 2021-06-16 NOTE — Patient Instructions (Addendum)
Go to lab for blood draw. Schedule appt with Dr. Talbert Nan for repeat PAP smear.  Preventive Care 58-58 Years Old, Female Preventive care refers to lifestyle choices and visits with your health care provider that can promote health and wellness. This includes: A yearly physical exam. This is also called an annual wellness visit. Regular dental and eye exams. Immunizations. Screening for certain conditions. Healthy lifestyle choices, such as: Eating a healthy diet. Getting regular exercise. Not using drugs or products that contain nicotine and tobacco. Limiting alcohol use. What can I expect for my preventive care visit? Physical exam Your health care provider will check your: Height and weight. These may be used to calculate your BMI (body mass index). BMI is a measurement that tells if you are at a healthy weight. Heart rate and blood pressure. Body temperature. Skin for abnormal spots. Counseling Your health care provider may ask you questions about your: Past medical problems. Family's medical history. Alcohol, tobacco, and drug use. Emotional well-being. Home life and relationship well-being. Sexual activity. Diet, exercise, and sleep habits. Work and work Statistician. Access to firearms. Method of birth control. Menstrual cycle. Pregnancy history. What immunizations do I need? Vaccines are usually given at various ages, according to a schedule. Your health care provider will recommend vaccines for you based on your age, medical history, and lifestyle or other factors, such as travel or where you work. What tests do I need? Blood tests Lipid and cholesterol levels. These may be checked every 5 years, or more often if you are over 19 years old. Hepatitis C test. Hepatitis B test. Screening Lung cancer screening. You may have this screening every year starting at age 25 if you have a 30-pack-year history of smoking and currently smoke or have quit within the past 15  years. Colorectal cancer screening. All adults should have this screening starting at age 42 and continuing until age 36. Your health care provider may recommend screening at age 88 if you are at increased risk. You will have tests every 1-10 years, depending on your results and the type of screening test. Diabetes screening. This is done by checking your blood sugar (glucose) after you have not eaten for a while (fasting). You may have this done every 1-3 years. Mammogram. This may be done every 1-2 years. Talk with your health care provider about when you should start having regular mammograms. This may depend on whether you have a family history of breast cancer. BRCA-related cancer screening. This may be done if you have a family history of breast, ovarian, tubal, or peritoneal cancers. Pelvic exam and Pap test. This may be done every 3 years starting at age 64. Starting at age 70, this may be done every 5 years if you have a Pap test in combination with an HPV test. Other tests STD (sexually transmitted disease) testing, if you are at risk. Bone density scan. This is done to screen for osteoporosis. You may have this scan if you are at high risk for osteoporosis. Talk with your health care provider about your test results, treatment options, and if necessary, the need for more tests. Follow these instructions at home: Eating and drinking  Eat a diet that includes fresh fruits and vegetables, whole grains, lean protein, and low-fat dairy products. Take vitamin and mineral supplements as recommended by your health care provider. Do not drink alcohol if: Your health care provider tells you not to drink. You are pregnant, may be pregnant, or are planning to become  pregnant. If you drink alcohol: Limit how much you have to 0-1 drink a day. Be aware of how much alcohol is in your drink. In the [place] one drink equals one 12 oz bottle of beer (355 mL), one 5 oz glass of wine (148 mL), or  one 1 oz glass of hard liquor (44 mL). Lifestyle Take daily care of your teeth and gums. Brush your teeth every morning and night with fluoride toothpaste. Floss one time each day. Stay active. Exercise for at least 30 minutes 5 or more days each week. Do not use any products that contain nicotine or tobacco, such as cigarettes, e-cigarettes, and chewing tobacco. If you need help quitting, ask your health care provider. Do not use drugs. If you are sexually active, practice safe sex. Use a condom or other form of protection to prevent STIs (sexually transmitted infections). If you do not wish to become pregnant, use a form of birth control. If you plan to become pregnant, see your health care provider for a prepregnancy visit. If told by your health care provider, take low-dose aspirin daily starting at age 48. Find healthy ways to cope with stress, such as: Meditation, yoga, or listening to music. Journaling. Talking to a trusted person. Spending time with friends and family. Safety Always wear your seat belt while driving or riding in a vehicle. Do not drive: If you have been drinking alcohol. Do not ride with someone who has been drinking. When you are tired or distracted. While texting. Wear a helmet and other protective equipment during sports activities. If you have firearms in your house, make sure you follow all gun safety procedures. What's next? Visit your health care provider once a year for an annual wellness visit. Ask your health care provider how often you should have your eyes and teeth checked. Stay up to date on all vaccines. This information is not intended to replace advice given to you by your health care provider. Make sure you discuss any questions you have with your health care provider. Document Revised: 10/16/2020 Document Reviewed: 04/19/2018 Elsevier Patient Education  2022 [employer].

## 2021-06-28 ENCOUNTER — Ambulatory Visit (INDEPENDENT_AMBULATORY_CARE_PROVIDER_SITE_OTHER): Payer: Managed Care, Other (non HMO) | Admitting: *Deleted

## 2021-06-28 DIAGNOSIS — J309 Allergic rhinitis, unspecified: Secondary | ICD-10-CM

## 2021-07-08 NOTE — Progress Notes (Signed)
VIALS MADE. EXP 07-08-22. DD

## 2021-07-09 DIAGNOSIS — J302 Other seasonal allergic rhinitis: Secondary | ICD-10-CM | POA: Diagnosis not present

## 2021-07-13 ENCOUNTER — Other Ambulatory Visit: Payer: Self-pay | Admitting: Nurse Practitioner

## 2021-07-19 ENCOUNTER — Ambulatory Visit (INDEPENDENT_AMBULATORY_CARE_PROVIDER_SITE_OTHER): Payer: Managed Care, Other (non HMO)

## 2021-07-19 DIAGNOSIS — J309 Allergic rhinitis, unspecified: Secondary | ICD-10-CM | POA: Diagnosis not present

## 2021-07-21 NOTE — Telephone Encounter (Signed)
Chart supports Rx Last seen 05/2021 Next OV 05/2022

## 2021-08-09 ENCOUNTER — Ambulatory Visit (INDEPENDENT_AMBULATORY_CARE_PROVIDER_SITE_OTHER): Payer: Managed Care, Other (non HMO)

## 2021-08-09 DIAGNOSIS — J309 Allergic rhinitis, unspecified: Secondary | ICD-10-CM

## 2021-08-17 ENCOUNTER — Ambulatory Visit (INDEPENDENT_AMBULATORY_CARE_PROVIDER_SITE_OTHER): Payer: Managed Care, Other (non HMO) | Admitting: *Deleted

## 2021-08-17 DIAGNOSIS — J309 Allergic rhinitis, unspecified: Secondary | ICD-10-CM | POA: Diagnosis not present

## 2021-08-23 ENCOUNTER — Ambulatory Visit (INDEPENDENT_AMBULATORY_CARE_PROVIDER_SITE_OTHER): Payer: No Typology Code available for payment source

## 2021-08-23 DIAGNOSIS — J309 Allergic rhinitis, unspecified: Secondary | ICD-10-CM | POA: Diagnosis not present

## 2021-08-30 ENCOUNTER — Ambulatory Visit (INDEPENDENT_AMBULATORY_CARE_PROVIDER_SITE_OTHER): Payer: No Typology Code available for payment source | Admitting: *Deleted

## 2021-08-30 DIAGNOSIS — J309 Allergic rhinitis, unspecified: Secondary | ICD-10-CM | POA: Diagnosis not present

## 2021-09-06 ENCOUNTER — Ambulatory Visit (INDEPENDENT_AMBULATORY_CARE_PROVIDER_SITE_OTHER): Payer: No Typology Code available for payment source

## 2021-09-06 DIAGNOSIS — J309 Allergic rhinitis, unspecified: Secondary | ICD-10-CM | POA: Diagnosis not present

## 2021-09-16 ENCOUNTER — Ambulatory Visit (INDEPENDENT_AMBULATORY_CARE_PROVIDER_SITE_OTHER): Payer: No Typology Code available for payment source

## 2021-09-16 ENCOUNTER — Other Ambulatory Visit: Payer: Self-pay

## 2021-09-16 DIAGNOSIS — Z23 Encounter for immunization: Secondary | ICD-10-CM

## 2021-09-16 NOTE — Progress Notes (Signed)
After obtaining consent, and per orders of [facility], injection of shingles#2  given by Lynda Rainwater. Patient instructed to remain in clinic for 20 minutes afterwards, and to report any adverse reaction to me immediately.

## 2021-10-04 ENCOUNTER — Ambulatory Visit (INDEPENDENT_AMBULATORY_CARE_PROVIDER_SITE_OTHER): Payer: No Typology Code available for payment source

## 2021-10-04 DIAGNOSIS — J309 Allergic rhinitis, unspecified: Secondary | ICD-10-CM | POA: Diagnosis not present

## 2021-10-21 ENCOUNTER — Encounter: Payer: Self-pay | Admitting: Nurse Practitioner

## 2021-10-22 ENCOUNTER — Encounter: Payer: Self-pay | Admitting: Nurse Practitioner

## 2021-10-22 ENCOUNTER — Other Ambulatory Visit: Payer: Self-pay

## 2021-10-22 ENCOUNTER — Ambulatory Visit (INDEPENDENT_AMBULATORY_CARE_PROVIDER_SITE_OTHER): Payer: No Typology Code available for payment source | Admitting: Nurse Practitioner

## 2021-10-22 ENCOUNTER — Ambulatory Visit (INDEPENDENT_AMBULATORY_CARE_PROVIDER_SITE_OTHER): Payer: No Typology Code available for payment source

## 2021-10-22 VITALS — BP 110/86 | HR 76 | Temp 97.0°F | Wt 139.2 lb

## 2021-10-22 DIAGNOSIS — M25521 Pain in right elbow: Secondary | ICD-10-CM | POA: Insufficient documentation

## 2021-10-22 DIAGNOSIS — G8929 Other chronic pain: Secondary | ICD-10-CM

## 2021-10-22 NOTE — Patient Instructions (Signed)
It was great to see you! ? ?We did an injection to your right elbow to help with the pain and inflammation. Please call the office if you have any signs of infection including redness or drainage at the site and/or fevers. Continue using ice, aleve, and voltaren gel as needed. Wear your wrist brace at night.  ? ?Let's follow-up if your symptoms don't improve or worsen.  ? ?Take care, ? ?Vance Peper, NP ? ?

## 2021-10-22 NOTE — Assessment & Plan Note (Signed)
Chronic, ongoing since fall in 04/2020. Pain has flared up recently from playing tennis. X-ray obtained today. Most likely tendonitis. She has been taking aleve, wearing a brace, and using voltaren gel. Discussed steroid injection and she would like to proceed. See procedure note below. Follow up if symptoms don't improve or worsen.  ?

## 2021-10-22 NOTE — Progress Notes (Signed)
Acute Office Visit  Subjective:    Patient ID: Yolanda [name], female    DOB: [birth year], 61 y.o.   MRN: [id number]  Chief Complaint  Patient presents with   Elbow Pain    Pt c/o rt elbow and wrist pain for several months due to fall injury.     HPI Patient is in today for right elbow pain. She fell one year ago and has been having intermittent pain off and on since. She has been playing tennis recently which has made the pain flair up. She has been taking aleve, and using voltaren gel and ice after activity. Endorses some popping in her right elbow. With movement, pain is a 5-7/10 and with no movement it is a 4/10. The pain sometimes radiates down her arm to her wrist.   Past Medical History:  Diagnosis Date   Acute appendicitis 09/14/2018   Allergy    Anemia    Anxiety    BRCA negative 12/2014   Depression    Endometriosis    Esophagitis    Fibroid    GERD (gastroesophageal reflux disease)    Hiatal hernia    History of diverticulitis    IC (interstitial cystitis)    Osteoporosis 01/2019   2018 T score -2.6, 2020 T score -2.0 on Actonel   Schatzki's ring     Past Surgical History:  Procedure Laterality Date   CHOLECYSTECTOMY     HYSTEROSCOPY  12/2003   RESECTON OF SUBMUCOUS  MYOMA/DX SCOPE   LAPAROSCOPIC APPENDECTOMY N/A 09/14/2018   Procedure: APPENDECTOMY LAPAROSCOPIC;  Surgeon: Ileana Roup, MD;  Location: [facility];  Service: General;  Laterality: N/A;   LAPAROSCOPIC BILATERAL SALPINGO OOPHERECTOMY Bilateral 07/02/2014   Procedure: LAPAROSCOPIC BILATERAL SALPINGO OOPHORECTOMY;  Surgeon: Anastasio Auerbach, MD;  Location: [facility];  Service: Gynecology;  Laterality: Bilateral;   PELVIC LAPAROSCOPY  12/2003   DX SCOPE/RESECTION OF SUBMUCOUS MYOMA   TMJ ARTHROSCOPY  11-26-2001   VAGINAL HYSTERECTOMY  12/05   Menorrhagia/dysmenorrhea. Pathology showed leiomyoma/adenomyosis    Family History  Problem Relation Age of Onset   Uterine cancer Maternal Grandmother         dx in her 21s   Breast cancer Sister        paternal half sister dx in her early 54s   Cystic fibrosis Other        brother's granddauther   Emphysema Father    Early death Father    Breast cancer Sister        paternal half sister dx in her late 87s   Cystic fibrosis Other        brothers granddaughters   Depression Mother    Hyperlipidemia Mother    Hypertension Mother    Mental illness Mother    Miscarriages / [place] Mother    Diabetes Brother 57       HALF BROTHER/HALF BROTHER    Breast cancer Sister    GI problems Brother    Colon cancer Neg Hx    Esophageal cancer Neg Hx    Rectal cancer Neg Hx    Stomach cancer Neg Hx    Allergic rhinitis Neg Hx    Asthma Neg Hx    Eczema Neg Hx    Urticaria Neg Hx     Social History   Socioeconomic History   Marital status: Married    Spouse name: Not on file   Number of children: 2   Years of education: Not on file  Highest education level: Not on file  Occupational History   Not on file  Tobacco Use   Smoking status: Never   Smokeless tobacco: Never  Vaping Use   Vaping Use: Never used  Substance and Sexual Activity   Alcohol use: Yes    Alcohol/week: 2.0 standard drinks    Types: 2 Glasses of wine per week    Comment: week   Drug use: No   Sexual activity: Yes    Birth control/protection: Surgical    Comment: HYST-1st intercourse 61 yo-Fewer than 5 partners  Other Topics Concern   Not on file  Social History Narrative   Not on file   Social Determinants of Health   Financial Resource Strain: Not on file  Food Insecurity: Not on file  Transportation Needs: Not on file  Physical Activity: Not on file  Stress: Not on file  Social Connections: Not on file  Intimate Partner Violence: Not on file    Outpatient Medications Prior to Visit  Medication Sig Dispense Refill   albuterol (VENTOLIN HFA) 108 (90 Base) MCG/ACT inhaler Inhale 2 puffs into the lungs every 6 (six) hours as needed for  wheezing or shortness of breath. 1 each 5   buPROPion (WELLBUTRIN XL) 150 MG 24 hr tablet Take 150 mg by mouth 2 (two) times daily.     Calcium Carbonate (CALCIUM 600 PO) Take 1 tablet by mouth daily.     colchicine 0.6 MG tablet Take 1 tablet (0.6 mg total) by mouth daily. 30 tablet 0   EPINEPHrine 0.3 mg/0.3 mL IJ SOAJ injection Inject 0.3 mg into the muscle as needed for anaphylaxis. 2 each 2   Estradiol 10 MCG TABS vaginal tablet INSERT 1 TABLET VAGINALLY EVERY NIGHT AT BEDTIME FOR 1 WEEK THEN TWICE WEEKLY AT BEDTIME. 24 tablet 3   LORazepam (ATIVAN) 0.5 MG tablet Take 0.5 mg by mouth daily as needed for sleep.      MAGNESIUM GLYCINATE PLUS PO Take 2 capsules by mouth at bedtime.     mometasone (NASONEX) 50 MCG/ACT nasal spray SHAKE LIQUID AND USE 2 SPRAYS IN EACH NOSTRIL DAILY 17 g 10   montelukast (SINGULAIR) 10 MG tablet TAKE 1 TABLET(10 MG) BY MOUTH AT BEDTIME 30 tablet 5   Multiple Vitamin (MULTIVITAMIN) tablet Take 1 tablet by mouth 2 (two) times daily.     olopatadine (PATANOL) 0.1 % ophthalmic solution INSTILL 1 DROP IN BOTH EYES TWICE DAILY 5 mL 0   Omega-3 Fatty Acids (OMEGAPURE 780 EC PO) Take 1 tablet by mouth daily.     risedronate (ACTONEL) 150 MG tablet Take 1 tablet (150 mg total) by mouth every 30 (thirty) days. TAKE ONE TABLET(150MG) BY MOUTH EVERY 30 DAYS WITH WATER ON EMPTY STOMAACH NOTHING BY MOUTH AND DON'T LIE DOWN FOR 30 MINUTES 3 tablet 3   No facility-administered medications prior to visit.    Allergies  Allergen Reactions   Honey Bee Treatment [Bee Venom] Anaphylaxis   Eggs Or Egg-Derived Products     Stomach ache   Methocarbamol    Peanut-Containing Drug Products Other (See Comments)    Allergy testing   Sesame Seed (Diagnostic) Other (See Comments)    Allergy test   Soy Allergy Other (See Comments)    Allergy test   Wheat Bran     Intolerance     Review of Systems See pertinent positives and negatives per HPI.    Objective:    Physical  Exam Vitals and nursing note reviewed.  Constitutional:  General: She is not in acute distress.    Appearance: Normal appearance.  HENT:     Head: Normocephalic.  Eyes:     Conjunctiva/sclera: Conjunctivae normal.  Pulmonary:     Effort: Pulmonary effort is normal.  Musculoskeletal:        General: Tenderness (medial epicondyle) present. No swelling or deformity. Normal range of motion.     Cervical back: Normal range of motion.  Skin:    General: Skin is warm.  Neurological:     General: No focal deficit present.     Mental Status: She is alert and oriented to person, place, and time.  Psychiatric:        Mood and Affect: Mood normal.        Behavior: Behavior normal.        Thought Content: Thought content normal.        Judgment: Judgment normal.    BP 110/86    Pulse 76    Temp (!) 97 F (36.1 C) (Temporal)    Wt 139 lb 3.2 oz (63.1 kg)    SpO2 99%    BMI 26.30 kg/m  Wt Readings from Last 3 Encounters:  10/22/21 139 lb 3.2 oz (63.1 kg)  06/16/21 139 lb 9.6 oz (63.3 kg)  04/05/21 136 lb 6.4 oz (61.9 kg)    Health Maintenance Due  Topic Date Due   PAP SMEAR-Modifier  08/17/2020   COVID-19 Vaccine (4 - Booster for Pfizer series) 08/31/2020    There are no preventive care reminders to display for this patient.   Lab Results  Component Value Date   TSH 1.74 06/16/2021   Lab Results  Component Value Date   WBC 7.4 06/16/2021   HGB 12.5 06/16/2021   HCT 38.3 06/16/2021   MCV 85.3 06/16/2021   PLT 236.0 06/16/2021   Lab Results  Component Value Date   NA 141 06/16/2021   K 4.0 06/16/2021   CO2 30 06/16/2021   GLUCOSE 77 06/16/2021   BUN 18 06/16/2021   CREATININE 0.69 06/16/2021   BILITOT 0.3 06/16/2021   ALKPHOS 94 06/16/2021   AST 17 06/16/2021   ALT 14 06/16/2021   PROT 6.8 06/16/2021   ALBUMIN 4.3 06/16/2021   CALCIUM 9.1 06/16/2021   GFR 95.91 06/16/2021   Lab Results  Component Value Date   CHOL 128 06/16/2021   Lab Results   Component Value Date   HDL 58.60 06/16/2021   Lab Results  Component Value Date   LDLCALC 61 06/16/2021   Lab Results  Component Value Date   TRIG 42.0 06/16/2021   Lab Results  Component Value Date   CHOLHDL 2 06/16/2021   Lab Results  Component Value Date   HGBA1C 5.1 09/12/2019       Assessment & Plan:   Problem List Items Addressed This Visit       Other   Chronic pain of right elbow - Primary    Chronic, ongoing since fall in 04/2020. Pain has flared up recently from playing tennis. X-ray obtained today. Most likely tendonitis. She has been taking aleve, wearing a brace, and using voltaren gel. Discussed steroid injection and she would like to proceed. See procedure note below. Follow up if symptoms don't improve or worsen.       Relevant Orders   DG Elbow Complete Right    Procedure: Right Medial Epicondylitis Steroid Injection        Diagnosis:   ICD-10-CM   1. Chronic pain of  right elbow  M25.521 DG Elbow Complete Right   G89.29 DG Elbow Complete Right      Physician: Vance Peper, NP Consent:  Risks, benefits, and alternative treatments discussed and all questions were answered.  Patient elected to proceed and verbal consent obtained.  Description: Area prepped and draped using  semi-sterile technique.  After identifying area of maximal tenderness, A mixture of 1cc 1% lidocaine and 0.5cc Kenalog 40 was injected.  A bandage was then placed over the injection site. Complications:  with introduction of needle, endorsed sharp pain radiating down to her right 4th and 5th fingers, needle repositioned. Post Procedure Instructions: Wound care instructions discussed and patient was instructed to keep area clean and dry.  Signs and symptoms of infection discussed, patient agrees to contact the office ASAP should they occur.  Follow Up: Return if symptoms worsen or fail to improve.   No orders of the defined types were placed in this encounter.    Charyl Dancer, NP

## 2021-11-01 ENCOUNTER — Ambulatory Visit (INDEPENDENT_AMBULATORY_CARE_PROVIDER_SITE_OTHER): Payer: No Typology Code available for payment source

## 2021-11-01 DIAGNOSIS — J309 Allergic rhinitis, unspecified: Secondary | ICD-10-CM

## 2021-11-09 ENCOUNTER — Other Ambulatory Visit: Payer: Self-pay

## 2021-11-09 ENCOUNTER — Ambulatory Visit
Admission: EM | Admit: 2021-11-09 | Discharge: 2021-11-09 | Disposition: A | Discharge status: Home / Self Care | Payer: No Typology Code available for payment source | Attending: Internal Medicine | Admitting: Internal Medicine

## 2021-11-09 ENCOUNTER — Ambulatory Visit (INDEPENDENT_AMBULATORY_CARE_PROVIDER_SITE_OTHER): Payer: No Typology Code available for payment source

## 2021-11-09 DIAGNOSIS — S56419A Strain of extensor muscle, fascia and tendon of finger, unspecified finger at forearm level, initial encounter: Secondary | ICD-10-CM

## 2021-11-09 DIAGNOSIS — M65272 Calcific tendinitis, left ankle and foot: Secondary | ICD-10-CM

## 2021-11-09 NOTE — Discharge Instructions (Addendum)
For more information about foot sprains, please read the enclosed information.  I recommend that you avoid intense physical activity for the next week to 10 days.  I do not believe that you need to wrap your foot and Ace wrap but I would avoid walking on uneven terrain or taking long walks.  When you are seated, keep your foot elevated and ice your foot as often as you can.  You can take ibuprofen 400 mg along with Tylenol 1000 mg 3 times daily as needed for pain.  If you have not had significant improvement of your pain after 10 days, please follow-up with orthopedics for further evaluation. ? ?Thank you for visiting urgent care today.  I am glad nothing is broken.  I appreciate the opportunity to participate in your care. ?

## 2021-11-09 NOTE — ED Provider Notes (Signed)
[facility]    CSN: [id number] Arrival date & time: 11/09/21  0910    HISTORY   Chief Complaint  Patient presents with   Foot Injury   HPI Yolanda [name] is a 61 y.o. female. Pt states stepped on a tennis ball yesterday, pause for about 30 seconds while it was hurting and then completed her game of tennis.  Patient states later that afternoon, she plated round of pickleball as well.  States she went out to dinner last night and while seated at [employer], it became very painful and was unable to walk on it to leave the restaurant.  States that last night she iced it and took some Aleve before going to bed, states this morning it feels better but still sore across the dorsum of the foot.  Patient ambulated independently into the clinical exam room.  Patient demonstrates full range of motion while we are talking.  The history is provided by the patient. 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   Lumbar radiculopathy 09/09/2019   Low back pain 05/27/2019   Closed fracture of coccyx (HCC) 12/04/2018   Well woman exam without gynecological exam 03/20/2018   Food allergy 03/20/2018   SOB (shortness of breath) on exertion 03/20/2018   Genetic testing 12/23/2014   Family history of breast cancer    Family history of uterine cancer    Osteoporosis 10/04/2014    Pancreatic duct dilated 05/10/2012   IBS 08/01/2008   UNS ADVRS EFF UNS RX MEDICINAL&BIOLOGICAL SBSTNC 01/21/2008   Depression, major, single episode, complete remission (HCC) 01/18/2008   RHINITIS, CHRONIC 01/18/2008   Past Surgical History:  Procedure Laterality Date   CHOLECYSTECTOMY     HYSTEROSCOPY  12/2003   RESECTON OF SUBMUCOUS  MYOMA/DX SCOPE   LAPAROSCOPIC APPENDECTOMY N/A 09/14/2018   Procedure: APPENDECTOMY LAPAROSCOPIC;  Surgeon: Andria Meuse, MD;  Location: [facility];  Service: General;  Laterality: N/A;   LAPAROSCOPIC BILATERAL SALPINGO OOPHERECTOMY Bilateral 07/02/2014   Procedure: LAPAROSCOPIC BILATERAL SALPINGO OOPHORECTOMY;  Surgeon: Dara Lords, MD;  Location: [facility];  Service: Gynecology;  Laterality: Bilateral;   PELVIC LAPAROSCOPY  12/2003   DX SCOPE/RESECTION OF SUBMUCOUS MYOMA   TMJ ARTHROSCOPY  11-26-2001   VAGINAL HYSTERECTOMY  12/05   Menorrhagia/dysmenorrhea. Pathology showed leiomyoma/adenomyosis   OB History     Gravida  2   Para  2   Term  2   Preterm      AB      Living  2      SAB      IAB      Ectopic      Multiple      Live Births  2  Home Medications    Prior to Admission medications   Medication Sig Start Date End Date Taking? Authorizing Provider  albuterol (VENTOLIN HFA) 108 (90 Base) MCG/ACT inhaler Inhale 2 puffs into the lungs every 6 (six) hours as needed for wheezing or shortness of breath. 11/24/20   Kozlow, Alvira Philips, MD  buPROPion (WELLBUTRIN XL) 150 MG 24 hr tablet Take 150 mg by mouth 2 (two) times daily. 10/23/20   [provider]  Calcium Carbonate (CALCIUM 600 PO) Take 1 tablet by mouth daily.    [provider]  colchicine 0.6 MG tablet Take 1 tablet (0.6 mg total) by mouth daily. 04/05/21   Mliss Sax, MD  EPINEPHrine 0.3 mg/0.3 mL IJ SOAJ injection Inject 0.3 mg into the muscle as needed for anaphylaxis. 11/24/20   Kozlow, Alvira Philips, MD  Estradiol 10 MCG TABS vaginal  tablet INSERT 1 TABLET VAGINALLY EVERY NIGHT AT BEDTIME FOR 1 WEEK THEN TWICE WEEKLY AT BEDTIME. 11/13/20   Romualdo Bolk, MD  LORazepam (ATIVAN) 0.5 MG tablet Take 0.5 mg by mouth daily as needed for sleep.     [provider]  MAGNESIUM GLYCINATE PLUS PO Take 2 capsules by mouth at bedtime.    [provider]  mometasone (NASONEX) 50 MCG/ACT nasal spray SHAKE LIQUID AND USE 2 SPRAYS IN EACH NOSTRIL DAILY 11/24/20   Kozlow, Alvira Philips, MD  montelukast (SINGULAIR) 10 MG tablet TAKE 1 TABLET(10 MG) BY MOUTH AT BEDTIME 07/21/21   Nche, Bonna Gains, NP  Multiple Vitamin (MULTIVITAMIN) tablet Take 1 tablet by mouth 2 (two) times daily.    [provider]  olopatadine (PATANOL) 0.1 % ophthalmic solution INSTILL 1 DROP IN BOTH EYES TWICE DAILY 11/25/19   Kozlow, Alvira Philips, MD  Omega-3 Fatty Acids (OMEGAPURE 780 EC PO) Take 1 tablet by mouth daily.    [provider]  risedronate (ACTONEL) 150 MG tablet Take 1 tablet (150 mg total) by mouth every 30 (thirty) days. TAKE ONE TABLET(150MG ) BY MOUTH EVERY 30 DAYS WITH WATER ON EMPTY STOMAACH NOTHING BY MOUTH AND DON'T LIE DOWN FOR 30 MINUTES 11/13/20   Romualdo Bolk, MD    Family History Family History  Problem Relation Age of Onset   Uterine cancer Maternal Grandmother        dx in her 30s   Breast cancer Sister        paternal half sister dx in her early 58s   Cystic fibrosis Other        brother's granddauther   Emphysema Father    Early death Father    Breast cancer Sister        paternal half sister dx in her late 6s   Cystic fibrosis Other        brothers granddaughters   Depression Mother    Hyperlipidemia Mother    Hypertension Mother    Mental illness Mother    Miscarriages / [place] Mother    Diabetes Brother 60       HALF BROTHER/HALF BROTHER    Breast cancer Sister    GI problems Brother    Colon cancer Neg Hx    Esophageal cancer Neg Hx    Rectal cancer Neg Hx    Stomach cancer Neg  Hx    Allergic rhinitis Neg Hx    Asthma Neg Hx    Eczema Neg Hx    Urticaria Neg Hx    Social History Social History   Tobacco Use   Smoking  status: Never   Smokeless tobacco: Never  Vaping Use   Vaping Use: Never used  Substance Use Topics   Alcohol use: Yes    Alcohol/week: 2.0 standard drinks    Types: 2 Glasses of wine per week    Comment: week   Drug use: No   Allergies   Honey bee treatment [bee venom], Eggs or egg-derived products, Methocarbamol, Peanut-containing drug products, Sesame seed (diagnostic), Soy allergy, and Wheat bran  Review of Systems Review of Systems Pertinent findings noted in history of present illness.   Physical Exam Triage Vital Signs ED Triage Vitals  Enc Vitals Group     BP 06/18/21 0827 (!) 147/82     Pulse Rate 06/18/21 0827 72     Resp 06/18/21 0827 18     Temp 06/18/21 0827 98.3 F (36.8 C)     Temp Source 06/18/21 0827 Oral     SpO2 06/18/21 0827 98 %     Weight --      Height --      Head Circumference --      Peak Flow --      Pain Score 06/18/21 0826 5     Pain Loc --      Pain Edu? --      Excl. in GC? --   No data found.  Updated Vital Signs BP 138/82 (BP Location: Left Arm)   Pulse 77   Temp 98.6 F (37 C) (Oral)   Resp 18   SpO2 97%   Physical Exam Vitals and nursing note reviewed.  Constitutional:      General: She is not in acute distress.    Appearance: Normal appearance. She is not ill-appearing.  HENT:     Head: Normocephalic and atraumatic.  Eyes:     General: Lids are normal.        Right eye: No discharge.        Left eye: No discharge.     Extraocular Movements: Extraocular movements intact.     Conjunctiva/sclera: Conjunctivae normal.     Right eye: Right conjunctiva is not injected.     Left eye: Left conjunctiva is not injected.  Neck:     Trachea: Trachea and phonation normal.  Cardiovascular:     Rate and Rhythm: Normal rate and regular rhythm.     Pulses: Normal pulses.     Heart  sounds: Normal heart sounds. No murmur heard.   No friction rub. No gallop.  Pulmonary:     Effort: Pulmonary effort is normal. No accessory muscle usage, prolonged expiration or respiratory distress.     Breath sounds: Normal breath sounds. No stridor, decreased air movement or transmitted upper airway sounds. No decreased breath sounds, wheezing, rhonchi or rales.  Chest:     Chest wall: No tenderness.  Musculoskeletal:        General: Tenderness (Dorsum of left foot, nonfocal) present. No swelling, deformity or signs of injury. Normal range of motion.     Cervical back: Normal range of motion and neck supple. Normal range of motion.  Lymphadenopathy:     Cervical: No cervical adenopathy.  Skin:    General: Skin is warm and dry.     Findings: No erythema or rash.  Neurological:     General: No focal deficit present.     Mental Status: She is alert and oriented to person, place, and time.  Psychiatric:        Mood and Affect: Mood normal.  Behavior: Behavior normal.    Visual Acuity Right Eye Distance:   Left Eye Distance:   Bilateral Distance:    Right Eye Near:   Left Eye Near:    Bilateral Near:     [facility] / Diagnostics / Procedures:    EKG  Radiology DG Foot Complete Left  Result Date: 11/09/2021 CLINICAL DATA:  Trauma, pain EXAM: LEFT FOOT - COMPLETE 3+ VIEW COMPARISON:  None. FINDINGS: No fracture or dislocation is seen. Linear smooth marginated calcification at the attachment of Achilles tendon to the calcaneus may suggest calcific tendinosis. IMPRESSION: No recent fracture or dislocation is seen in the left foot. Electronically Signed   By: Ernie Avena M.D.   On: 11/09/2021 10:11    Procedures Procedures (including critical care time)  UC Diagnoses / Final Clinical Impressions(s)   I have reviewed the triage vital signs and the nursing notes.  Pertinent labs & imaging results that were available during my care of the patient were reviewed by me  and considered in my medical decision making (see chart for details).    Final diagnoses:  Calcific tendinitis of ankle, left  Strain of extensor digitorum tendon   X-ray of foot today is unremarkable with the exception of some calcific tendinitis at the attachment of the Achilles tendon.  Patient denies pain of her heels.  Patient advised conservative care including rest, ice, NSAIDs and elevation.  Patient also advised to avoid strenuous physical activity for the next week to 10 days and to follow-up with orthopedics if she is not feeling significantly better after 10 days.  ED Prescriptions   None    PDMP not reviewed this encounter.  Pending results:  Labs Reviewed - No data to display  Medications Ordered in UC: Medications - No data to display  Disposition Upon Discharge:  Condition: stable for discharge home Home: take medications as prescribed; routine discharge instructions as discussed; follow up as advised.  Patient presented with an acute illness with associated systemic symptoms and significant discomfort requiring urgent management. In my opinion, this is a condition that a prudent lay person (someone who possesses an average knowledge of health and medicine) may potentially expect to result in complications if not addressed urgently such as respiratory distress, impairment of bodily function or dysfunction of bodily organs.   Routine symptom specific, illness specific and/or disease specific instructions were discussed with the patient and/or caregiver at length.   As such, the patient has been evaluated and assessed, work-up was performed and treatment was provided in alignment with urgent care protocols and evidence based medicine.  Patient/parent/caregiver has been advised that the patient may require follow up for further testing and treatment if the symptoms continue in spite of treatment, as clinically indicated and appropriate.  If the patient was tested for  COVID-19, Influenza and/or RSV, then the patient/parent/guardian was advised to isolate at home pending the results of his/her diagnostic coronavirus test and potentially longer if theyre positive. I have also advised pt that if his/her COVID-19 test returns positive, it's recommended to self-isolate for at least 10 days after symptoms first appeared AND until fever-free for 24 hours without fever reducer AND other symptoms have improved or resolved. Discussed self-isolation recommendations as well as instructions for household member/close contacts as per the [facility] and [place] DHHS, and also gave patient the COVID packet with this information.  Patient/parent/caregiver has been advised to return to the [facility] or PCP in 3-5 days if no better; to PCP or the  Emergency Department if new signs and symptoms develop, or if the current signs or symptoms continue to change or worsen for further workup, evaluation and treatment as clinically indicated and appropriate  The patient will follow up with their current PCP if and as advised. If the patient does not currently have a PCP we will assist them in obtaining one.   The patient may need specialty follow up if the symptoms continue, in spite of conservative treatment and management, for further workup, evaluation, consultation and treatment as clinically indicated and appropriate.   Patient/parent/caregiver verbalized understanding and agreement of plan as discussed.  All questions were addressed during visit.  Please see discharge instructions below for further details of plan.  Discharge Instructions:   Discharge Instructions      For more information about foot sprains, please read the enclosed information.  I recommend that you avoid intense physical activity for the next week to 10 days.  I do not believe that you need to wrap your foot and Ace wrap but I would avoid walking on uneven terrain or taking long walks.  When you are seated, keep your foot elevated  and ice your foot as often as you can.  You can take ibuprofen 400 mg along with Tylenol 1000 mg 3 times daily as needed for pain.  If you have not had significant improvement of your pain after 10 days, please follow-up with orthopedics for further evaluation.  Thank you for visiting urgent care today.  I am glad nothing is broken.  I appreciate the opportunity to participate in your care.    This office note has been dictated using Teaching laboratory technician.  Unfortunately, and despite my best efforts, this method of dictation can sometimes lead to occasional typographical or grammatical errors.  I apologize in advance if this occurs.     Theadora Rama Scales, PA-C 11/09/21 1038

## 2021-11-09 NOTE — Progress Notes (Signed)
61 y.o. G6P2002 Married White or Caucasian Hispanic or Latino female here for annual exam.  H/O hysterectomy. Sexually active, using vaginal estrogen which helps. Uses a lubricant.  ? ?She is having some incontinence with activity. Slightly worse in the last year. Leaks with valsalva and sometimes leaks on the way to the bathroom. She leaks small amounts several days a week, GSI>urge.  ?  ?H/O osteoporosis, on Actonel since 5/18 ? ?See genetics note, risk of breast cancer is estimated at 8-10%. ? ?She c/o a vaginal odor for the last 6 months.  ? ?No LMP recorded. Patient has had a hysterectomy.          ?Sexually active: Yes.    ?The current method of family planning is status post hysterectomy.    ?Exercising: Yes.     Tennis , golf , walking and personal trainer  ?Smoker:  no ? ?Health Maintenance: ?Pap:  08/17/17 Neg ?            06/24/14 Neg ?History of abnormal Pap:  no ?MMG:  12/28/20 Bi-rads 1 neg  ?BMD:   02/16/21 osteopenia, t score -1.9 f/u 2 years (improved) ?Colonoscopy: 03/20/13 f/u 10 years  ?TDaP:  03/20/18  ?Gardasil: n/a ? ? reports that she has never smoked. She has never used smokeless tobacco. She reports current alcohol use of about 2.0 standard drinks per week. She reports that she does not use drugs. Homemaker. 2 daughters. One Daughter in in [place], she is a Neurosurgeon. Other daughter is local and has a baby boy (1.5 years). She watches him 1 x a week.   ? ?Past Medical History:  ?Diagnosis Date  ? Acute appendicitis 09/14/2018  ? Allergy   ? Anemia   ? Anxiety   ? BRCA negative 12/2014  ? Depression   ? Endometriosis   ? Esophagitis   ? Fibroid   ? GERD (gastroesophageal reflux disease)   ? Hiatal hernia   ? History of diverticulitis   ? IC (interstitial cystitis)   ? Osteoporosis 01/2019  ? 2018 T score -2.6, 2020 T score -2.0 on Actonel  ? Schatzki's ring   ? ? ?Past Surgical History:  ?Procedure Laterality Date  ? CHOLECYSTECTOMY    ? HYSTEROSCOPY  12/2003  ? RESECTON OF SUBMUCOUS  MYOMA/DX SCOPE  ?  LAPAROSCOPIC APPENDECTOMY N/A 09/14/2018  ? Procedure: APPENDECTOMY LAPAROSCOPIC;  Surgeon: Ileana Roup, MD;  Location: [facility];  Service: General;  Laterality: N/A;  ? LAPAROSCOPIC BILATERAL SALPINGO OOPHERECTOMY Bilateral 07/02/2014  ? Procedure: LAPAROSCOPIC BILATERAL SALPINGO OOPHORECTOMY;  Surgeon: Anastasio Auerbach, MD;  Location: [facility];  Service: Gynecology;  Laterality: Bilateral;  ? PELVIC LAPAROSCOPY  12/2003  ? DX SCOPE/RESECTION OF SUBMUCOUS MYOMA  ? TMJ ARTHROSCOPY  11-26-2001  ? VAGINAL HYSTERECTOMY  12/05  ? Menorrhagia/dysmenorrhea. Pathology showed leiomyoma/adenomyosis  ? ? ?Current Outpatient Medications  ?Medication Sig Dispense Refill  ? albuterol (VENTOLIN HFA) 108 (90 Base) MCG/ACT inhaler Inhale 2 puffs into the lungs every 6 (six) hours as needed for wheezing or shortness of breath. 1 each 5  ? buPROPion (WELLBUTRIN XL) 150 MG 24 hr tablet Take 150 mg by mouth 2 (two) times daily.    ? Calcium Carbonate (CALCIUM 600 PO) Take 1 tablet by mouth daily.    ? EPINEPHrine 0.3 mg/0.3 mL IJ SOAJ injection Inject 0.3 mg into the muscle as needed for anaphylaxis. 2 each 2  ? Estradiol 10 MCG TABS vaginal tablet INSERT 1 TABLET VAGINALLY EVERY NIGHT AT BEDTIME  FOR 1 WEEK THEN TWICE WEEKLY AT BEDTIME. 24 tablet 3  ? LORazepam (ATIVAN) 0.5 MG tablet Take 0.5 mg by mouth daily as needed for sleep.     ? MAGNESIUM GLYCINATE PLUS PO Take 2 capsules by mouth at bedtime.    ? mometasone (NASONEX) 50 MCG/ACT nasal spray SHAKE LIQUID AND USE 2 SPRAYS IN EACH NOSTRIL DAILY 17 g 10  ? montelukast (SINGULAIR) 10 MG tablet TAKE 1 TABLET(10 MG) BY MOUTH AT BEDTIME 30 tablet 5  ? Multiple Vitamin (MULTIVITAMIN) tablet Take 1 tablet by mouth 2 (two) times daily.    ? olopatadine (PATANOL) 0.1 % ophthalmic solution INSTILL 1 DROP IN BOTH EYES TWICE DAILY 5 mL 0  ? Omega-3 Fatty Acids (OMEGAPURE 780 EC PO) Take 1 tablet by mouth daily.    ? risedronate (ACTONEL) 150 MG tablet Take 1 tablet (150 mg total) by  mouth every 30 (thirty) days. TAKE ONE TABLET(150MG) BY MOUTH EVERY 30 DAYS WITH WATER ON EMPTY STOMAACH NOTHING BY MOUTH AND DON'T LIE DOWN FOR 30 MINUTES 3 tablet 3  ? ?No current facility-administered medications for this visit.  ? ? ?Family History  ?Problem Relation Age of Onset  ? Uterine cancer Maternal Grandmother   ?     dx in her 69s  ? Breast cancer Sister   ?     paternal half sister dx in her early 4s  ? Cystic fibrosis Other   ?     brother's granddauther  ? Emphysema Father   ? Early death Father   ? Breast cancer Sister   ?     paternal half sister dx in her late 59s  ? Cystic fibrosis Other   ?     brothers granddaughters  ? Depression Mother   ? Hyperlipidemia Mother   ? Hypertension Mother   ? Mental illness Mother   ? Miscarriages / [place] Mother   ? Diabetes Brother 73  ?     HALF BROTHER/HALF BROTHER   ? Breast cancer Sister   ? GI problems Brother   ? Colon cancer Neg Hx   ? Esophageal cancer Neg Hx   ? Rectal cancer Neg Hx   ? Stomach cancer Neg Hx   ? Allergic rhinitis Neg Hx   ? Asthma Neg Hx   ? Eczema Neg Hx   ? Urticaria Neg Hx   ? ? ?Review of Systems  ?Genitourinary:   ?     Vaginal odor  ?  ?All other systems reviewed and are negative. ? ?Exam:   ?BP 112/64   Pulse 76   Ht 5' 0.63" (1.54 m)   Wt 136 lb (61.7 kg)   SpO2 99%   BMI 26.01 kg/m?   Weight change: @WEIGHTCHANGE @ Height:   Height: 5' 0.63" (154 cm)  ?Ht Readings from Last 3 Encounters:  ?11/17/21 5' 0.63" (1.54 m)  ?06/16/21 5' 1"  (1.549 m)  ?04/05/21 5' 1"  (1.549 m)  ? ? ?General appearance: alert, cooperative and appears stated age ?Head: Normocephalic, without obvious abnormality, atraumatic ?Neck: no adenopathy, supple, symmetrical, trachea midline and thyroid normal to inspection and palpation ?Lungs: clear to auscultation bilaterally ?Cardiovascular: regular rate and rhythm ?Breasts: normal appearance, no masses or tenderness ?Abdomen: soft, non-tender; non distended,  no masses,  no  organomegaly ?Extremities: extremities normal, atraumatic, no cyanosis or edema ?Skin: Skin color, texture, turgor normal. No rashes or lesions ?Lymph nodes: Cervical, supraclavicular, and axillary nodes normal. ?No abnormal inguinal nodes palpated ?Neurologic: Grossly normal ? ? ?  Pelvic: External genitalia:  no lesions ?             Urethra:  normal appearing urethra with no masses, tenderness or lesions ?             Bartholins and Skenes: normal    ?             Vagina: normal appearing vagina with normal color and discharge, no lesions ?             Cervix: absent ?              ?Bimanual Exam:  Uterus:  uterus absent ?             Adnexa: no mass, fullness, tenderness ?              Rectovaginal: Confirms ?              Anus:  normal sphincter tone, no lesions ? ?Gae Dry chaperoned for the exam. ? ?1. Well woman exam ?Discussed breast self exam ?Discussed calcium and vit D intake ?Mammogram in 5/23 ?Colonoscopy next year ? ?2. Mixed incontinence ?Information given, discussed kegels and PT ?- Urinalysis,Complete w/RFL Culture ? ?3. Vaginal atrophy ?- Estradiol 10 MCG TABS vaginal tablet; INSERT 1 TABLET VAGINALLY EVERY NIGHT AT BEDTIME FOR 1 WEEK THEN TWICE WEEKLY AT BEDTIME.  Dispense: 24 tablet; Refill: 3 ? ?4. History of osteoporosis ?Improved DEXA in 6/22, will go off of Actonel ?Next DEXA due in 6/24 ? ?5. Vaginal odor ?- WET PREP FOR TRICH, YEAST, CLUE ?- SureSwab? Advanced Vaginitis, TMA ?-If sureswab is negative would recommend trying a vaginal moisturizer (ie Replens) ? ?

## 2021-11-09 NOTE — ED Triage Notes (Signed)
Pt states stepped on a tennis ball yesterday. States unable to walk on it yesterday but can today its just achy. States iced and took aleve yesterday.  ?

## 2021-11-17 ENCOUNTER — Encounter: Payer: Self-pay | Admitting: Obstetrics and Gynecology

## 2021-11-17 ENCOUNTER — Ambulatory Visit (INDEPENDENT_AMBULATORY_CARE_PROVIDER_SITE_OTHER): Payer: No Typology Code available for payment source | Admitting: Obstetrics and Gynecology

## 2021-11-17 VITALS — BP 112/64 | HR 76 | Ht 60.63 in | Wt 136.0 lb

## 2021-11-17 DIAGNOSIS — Z01419 Encounter for gynecological examination (general) (routine) without abnormal findings: Secondary | ICD-10-CM | POA: Diagnosis not present

## 2021-11-17 DIAGNOSIS — Z8739 Personal history of other diseases of the musculoskeletal system and connective tissue: Secondary | ICD-10-CM

## 2021-11-17 DIAGNOSIS — N952 Postmenopausal atrophic vaginitis: Secondary | ICD-10-CM

## 2021-11-17 DIAGNOSIS — N3946 Mixed incontinence: Secondary | ICD-10-CM

## 2021-11-17 DIAGNOSIS — N898 Other specified noninflammatory disorders of vagina: Secondary | ICD-10-CM | POA: Diagnosis not present

## 2021-11-17 LAB — URINALYSIS, COMPLETE W/RFL CULTURE
Bacteria, UA: NONE SEEN /HPF
Bilirubin Urine: NEGATIVE
Glucose, UA: NEGATIVE
Hgb urine dipstick: NEGATIVE
Hyaline Cast: NONE SEEN /LPF
Ketones, ur: NEGATIVE
Leukocyte Esterase: NEGATIVE
Nitrites, Initial: NEGATIVE
Protein, ur: NEGATIVE
RBC / HPF: NONE SEEN /HPF (ref 0–2)
Specific Gravity, Urine: 1.025 (ref 1.001–1.035)
WBC, UA: NONE SEEN /HPF (ref 0–5)
pH: 5.5 (ref 5.0–8.0)

## 2021-11-17 LAB — NO CULTURE INDICATED

## 2021-11-17 LAB — WET PREP FOR TRICH, YEAST, CLUE

## 2021-11-17 IMAGING — MR MR BREAST WO/W CM  BILAT
7 of 9 series · 33 of 48 positions shown · IV contrast (6 ml gadavist)
Comparison: Previous exam(s).

CLINICAL DATA: Abbreviated Breast MRI for breast cancer screening.

LABS:  None performed on site.
EXAM:
BILATERAL ABBREVIATED BREAST MRI WITH AND WITHOUT CONTRAST
TECHNIQUE: Multiplanar, multisequence MR images of both breasts were obtained
prior to and following the intravenous administration of 6 ml of
Gadavist.
Three-dimensional MR images were rendered by post-processing of the
original MR data on an independent workstation. The
three-dimensional MR images were interpreted, and findings are
reported in the following complete MRI report for this study.

[Series 2: t2_tirm_tra ipat (a-p) · axial · 3.0mm · 0.70mm/px · z∈[-90,+72]mm · 2 of 55 slices shown]
[im 1/55]
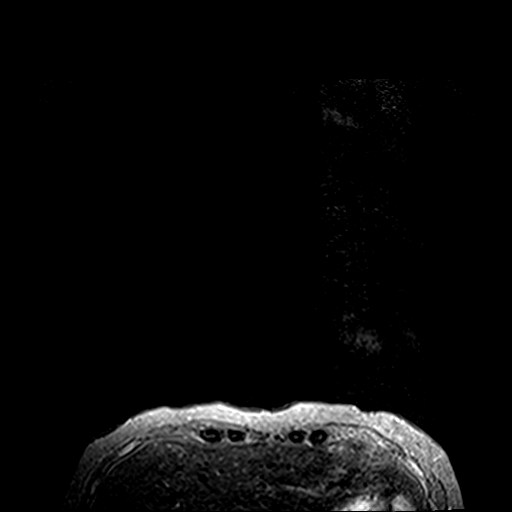
[im 55/55]
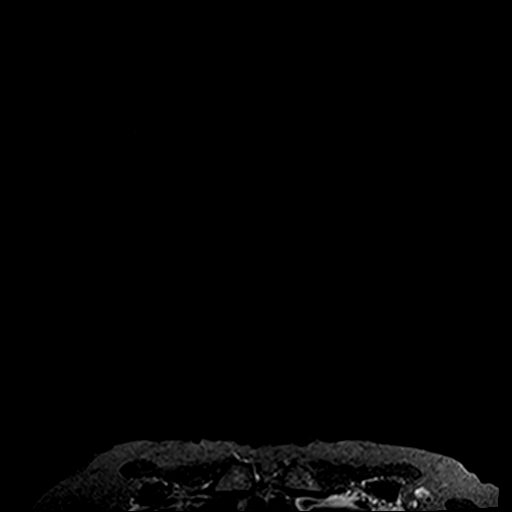

[Series 3: fl3d pre-cm no · axial · non-contrast · 1.2mm · 0.94mm/px · z∈[-95,+77]mm · 7 of 144 slices shown]
[im 1/144]
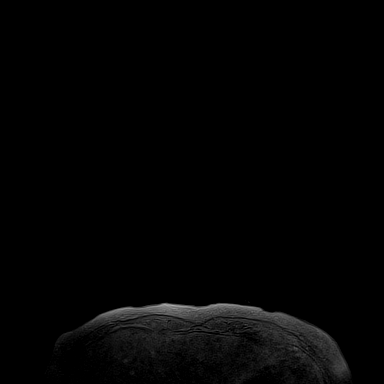
[im 24/144]
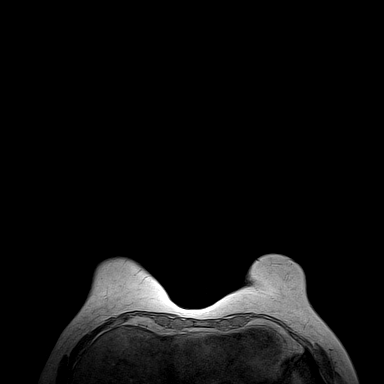
[im 48/144]
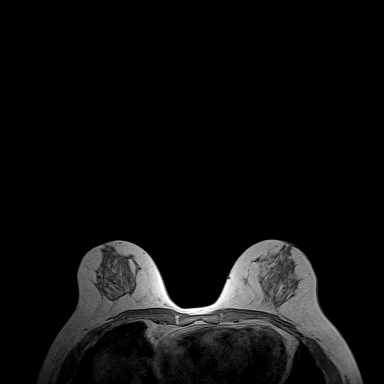
[im 72/144]
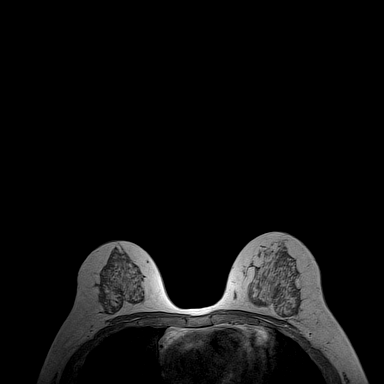
[im 96/144]
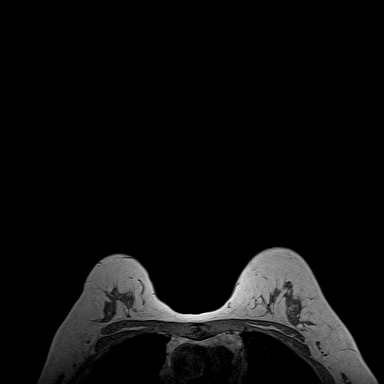
[im 120/144]
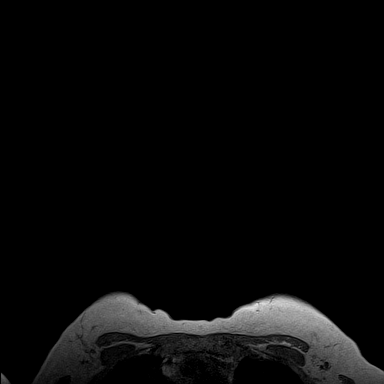
[im 144/144]
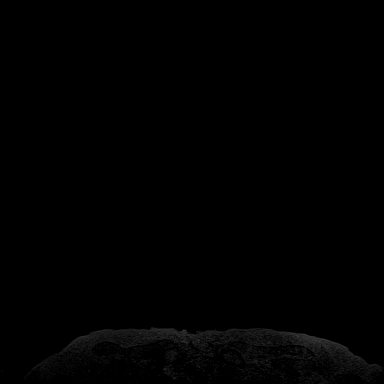

[Series 4: fl3d pre-cm · axial · non-contrast · 1.2mm · 0.94mm/px · z∈[-95,+77]mm · 7 of 144 slices shown]
[im 1/144]
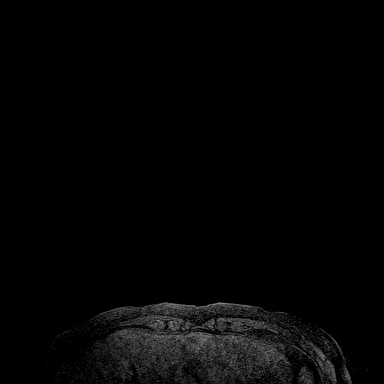
[im 24/144]
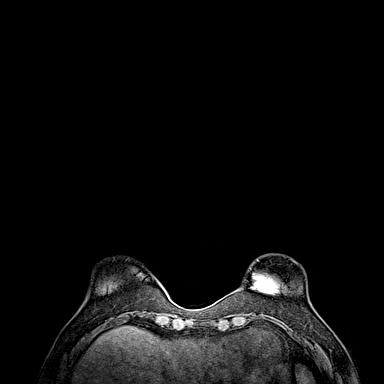
[im 48/144]
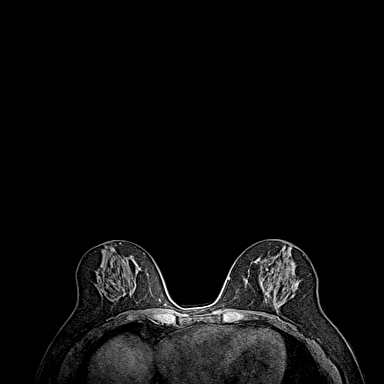
[im 72/144]
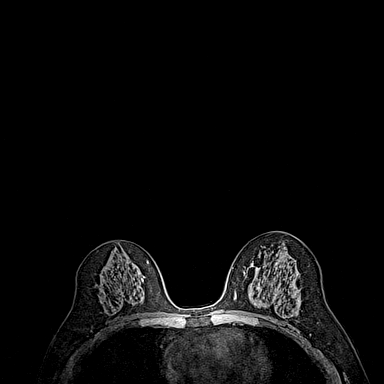
[im 96/144]
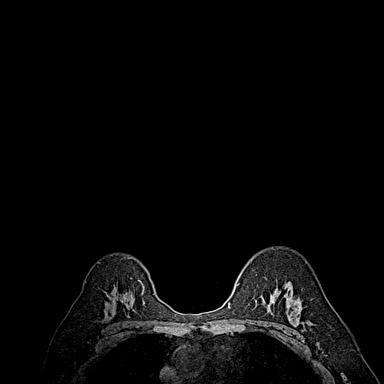
[im 120/144]
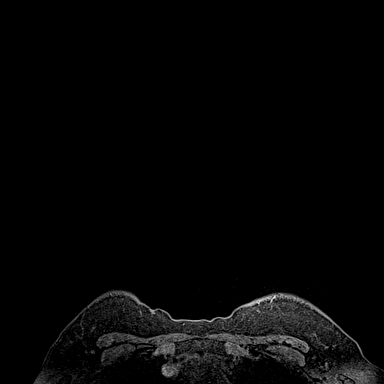
[im 144/144]
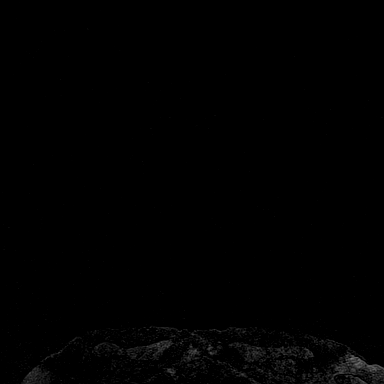

[Series 5: fl3d post-cm 20 · axial · 1.2mm · 0.94mm/px · z∈[-95,+77]mm · 7 of 144 slices shown (1 of 3)]
[im 1/144]
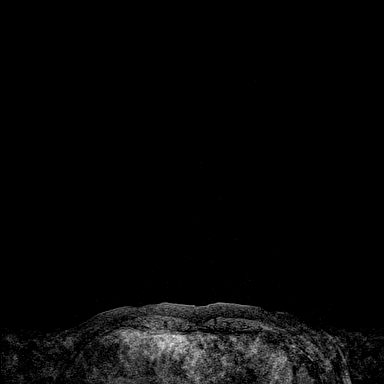
[im 24/144]
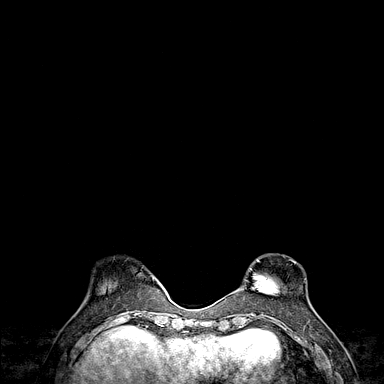
[im 48/144]
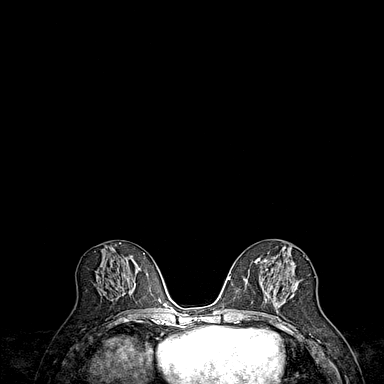
[im 72/144]
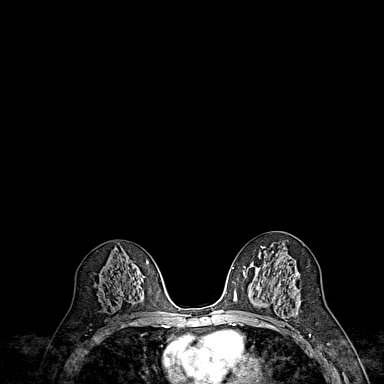
[im 96/144]
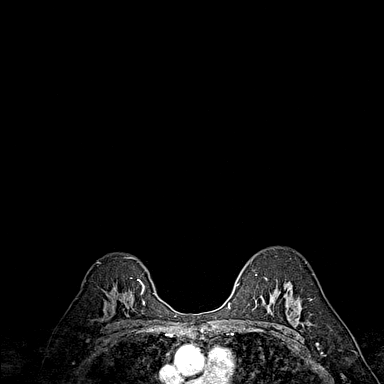
[im 120/144]
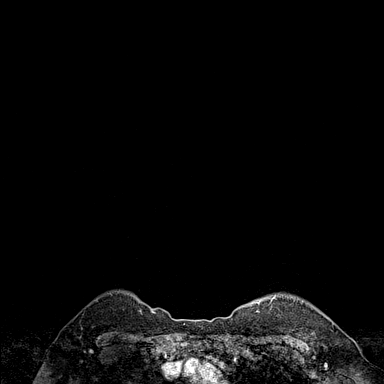
[im 144/144]
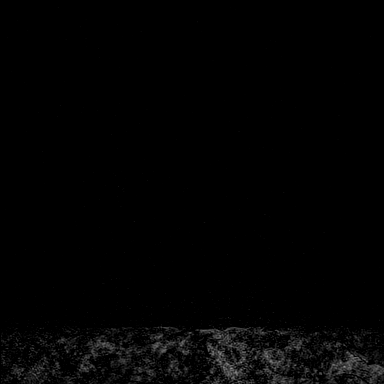

[Series 6: fl3d post-cm 20 · axial · 1.2mm · 0.94mm/px · z∈[-95,+77]mm · 7 of 144 slices shown (2 of 3)]
[im 1/144]
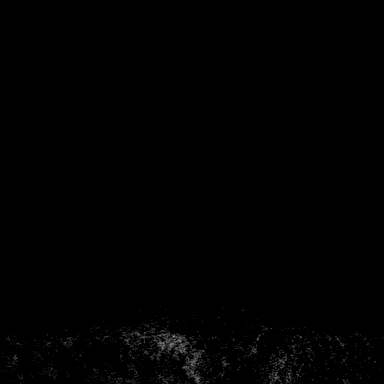
[im 24/144]
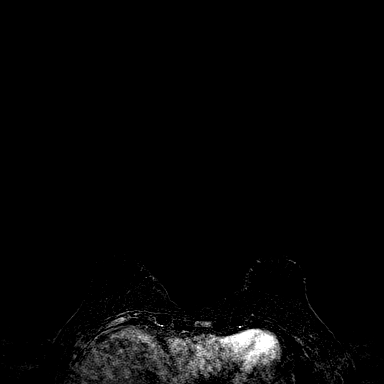
[im 48/144]
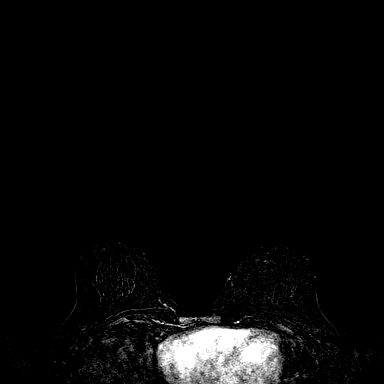
[im 72/144]
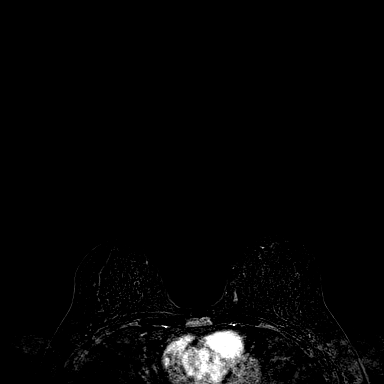
[im 96/144]
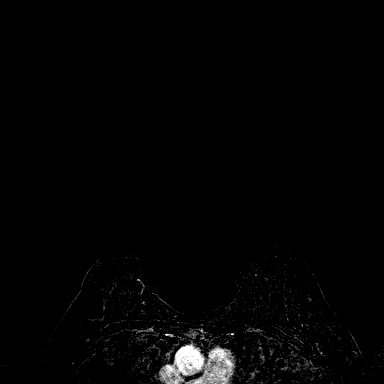
[im 120/144]
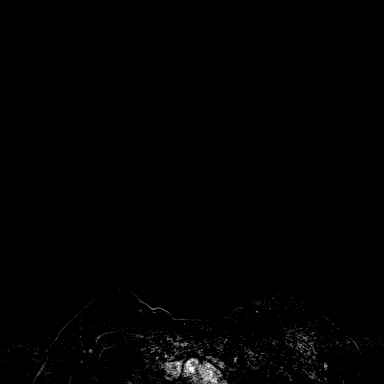
[im 144/144]
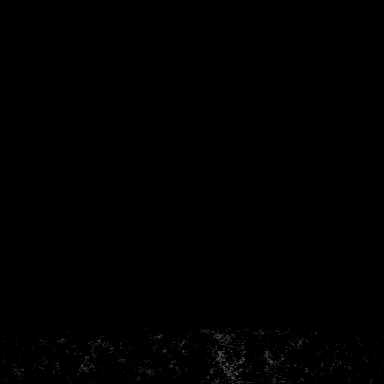

[Series 7: fl3d post-cm 20 · axial · 172.8mm · 0.94mm/px · 1 of 1 slices shown (3 of 3)]
[im 1/1]
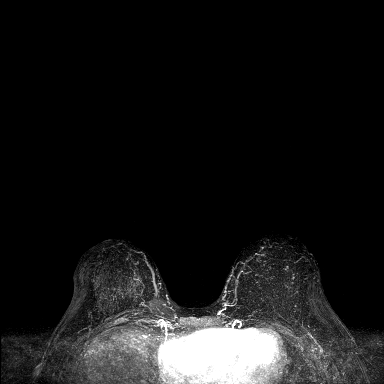

[Series 8: fl3d post-cm 3min · axial · 1.2mm · 0.94mm/px · z∈[-95,-71]mm · 2 of 144 slices shown]
[im 1/144]
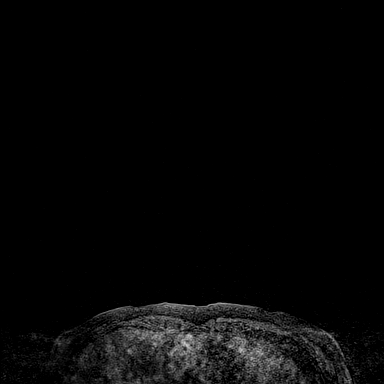
[im 21/144]
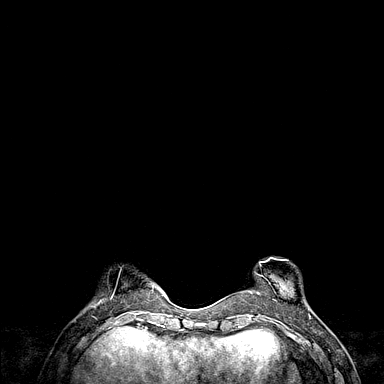

[33 of 48 positions shown; findings below may reference images not displayed]

FINDINGS: Breast composition: c. Heterogeneous fibroglandular tissue.

Background parenchymal enhancement: Mild.

Right breast: No suspicious mass or abnormal enhancement.

Left breast: No suspicious mass or abnormal enhancement.

Lymph nodes: No abnormal appearing lymph nodes.

Ancillary findings:  None.
IMPRESSION: No MRI evidence of malignancy in either breast.

RECOMMENDATION:
Recommend annual screening mammography. The patient is also eligible
for annual Abbreviated Breast MRI if she wishes.

BI-RADS CATEGORY  1: Negative.

## 2021-11-17 MED ORDER — ESTRADIOL 10 MCG VA TABS: ORAL_TABLET | VAGINAL | 3 refills | Status: DC

## 2021-11-17 NOTE — Patient Instructions (Addendum)
Try uberlube for vaginal lubrication ? ?If you vaginitis panel is negative for infection, try a vaginal moisturizer (ie Replens) for the vaginal odor. ? ?Urinary Incontinence ?Urinary incontinence refers to a condition in which a person is unable to control where and when to pass urine. A person with this condition will urinate involuntarily. This means that the person urinates when he or she does not mean to. ?What are the causes? ?This condition may be caused by: ?Medicines. ?Infections. ?Constipation. ?Overactive bladder muscles. ?Weak bladder muscles. ?Weak pelvic floor muscles. These muscles provide support for the bladder, intestine, and, in women, the uterus. ?Enlarged prostate in men. The prostate is a gland near the bladder. When it gets too big, it can pinch the urethra. With the urethra blocked, the bladder can weaken and lose the ability to empty properly. ?Surgery. ?Emotional factors, such as anxiety, stress, or post-traumatic stress disorder (PTSD). ?Spinal cord injury, nerve injury, or other neurological conditions. ?Pelvic organ prolapse. This happens in women when organs move out of place and into the vagina. This movement can prevent the bladder and urethra from working properly. ?What increases the risk? ?The following factors may make you more likely to develop this condition: ?Age. The older you are, the higher the risk. ?Obesity. ?Being physically inactive. ?Pregnancy and childbirth. ?Menopause. ?Diseases that affect the nerves or spinal cord. ?Long-term, or chronic, coughing. This can increase pressure on the bladder and pelvic floor muscles. ?What are the signs or symptoms? ?Symptoms may vary depending on the type of urinary incontinence you have. They include: ?A sudden urge to urinate, and passing urine involuntarily before you can get to a bathroom (urge incontinence). ?Suddenly passing urine when doing activities that force urine to pass, such as coughing, laughing, exercising, or  sneezing (stress incontinence). ?Needing to urinate often but urinating only a small amount, or constantly dribbling urine (overflow incontinence). ?Urinating because you cannot get to the bathroom in time due to a physical disability, such as arthritis or injury, or due to a communication or thinking problem, such as Alzheimer's disease (functional incontinence). ?How is this diagnosed? ?This condition may be diagnosed based on: ?Your medical history. ?A physical exam. ?Tests, such as: ?Urine tests. ?X-rays of your kidney and bladder. ?Ultrasound. ?CT scan. ?Cystoscopy. In this procedure, a health care provider inserts a tube with a light and camera (cystoscope) through the urethra and into the bladder to check for problems. ?Urodynamic testing. These tests assess how well the bladder, urethra, and sphincter can store and release urine. There are different types of urodynamic tests, and they vary depending on what the test is measuring. ?To help diagnose your condition, your health care provider may recommend that you keep a log of when you urinate and how much you urinate. ?How is this treated? ?Treatment for this condition depends on the type of incontinence that you have and its cause. Treatment may include: ?Lifestyle changes, such as: ?Quitting smoking. ?Maintaining a healthy weight. ?Staying active. Try to get 150 minutes of moderate-intensity exercise every week. Ask your health care provider which activities are safe for you. ?Eating a healthy diet. ?Avoid high-fat foods, like fried foods. ?Avoid refined carbohydrates like white bread and white rice. ?Limit how much alcohol and caffeine you drink. ?Increase your fiber intake. Healthy sources of fiber include beans, whole grains, and fresh fruits and vegetables. ?Behavioral changes, such as: ?Pelvic floor muscle exercises. ?Bladder training, such as lengthening the amount of time between bathroom breaks, or using the bathroom at regular  intervals. ?Using  techniques to suppress bladder urges. This can include distraction techniques or controlled breathing exercises. ?Medicines, such as: ?Medicines to relax the bladder muscles and prevent bladder spasms. ?Medicines to help slow or prevent the growth of a man's prostate. ?Botox injections. These can help relax the bladder muscles. ?Treatments, such as: ?Using pulses of electricity to help change bladder reflexes (electrical nerve stimulation). ?For women, using a medical device to prevent urine leaks. This is a small, tampon-like, disposable device that is inserted into the urethra. ?Injecting collagen or carbon beads (bulking agents) into the urinary sphincter. These can help thicken tissue and close the bladder opening. ?Surgery. ?Follow these instructions at home: ?Lifestyle ?Limit alcohol and caffeine. These can fill your bladder quickly and irritate it. ?Keep yourself clean to help prevent odors and skin damage. Ask your health care provider about special skin creams and cleansers that can protect the skin from urine. ?Consider wearing pads or adult diapers. Make sure to change them regularly, and always change them right after experiencing incontinence. ?General instructions ?Take over-the-counter and prescription medicines only as told by your health care provider. ?Use the bathroom about every 3-4 hours, even if you do not feel the need to urinate. Try to empty your bladder completely every time. After urinating, wait a minute. Then try to urinate again. ?Make sure you are in a relaxed position while urinating. ?If your incontinence is caused by nerve problems, keep a log of the medicines you take and the times you go to the bathroom. ?Keep all follow-up visits. This is important. ?Where to find more information ?[employer] Diabetes and Digestive and Kidney Diseases: [employer] ?American Urology Association: www.urologyhealth.org ?Contact a health care provider if: ?You have pain that gets  worse. ?Your incontinence gets worse. ?Get help right away if: ?You have a fever or chills. ?You are unable to urinate. ?You have redness in your groin area or down your legs. ?Summary ?Urinary incontinence refers to a condition in which a person is unable to control where and when to pass urine. ?This condition may be caused by medicines, infection, weak bladder muscles, weak pelvic floor muscles, enlargement of the prostate (in men), or surgery. ?Factors such as older age, obesity, pregnancy and childbirth, menopause, neurological diseases, and chronic coughing may increase your risk for developing this condition. ?Types of urinary incontinence include urge incontinence, stress incontinence, overflow incontinence, and functional incontinence. ?This condition is usually treated first with lifestyle and behavioral changes, such as quitting smoking, eating a healthier diet, and doing regular pelvic floor exercises. Other treatment options include medicines, bulking agents, medical devices, electrical nerve stimulation, or surgery. ?This information is not intended to replace advice given to you by your health care provider. Make sure you discuss any questions you have with your health care provider. ?Document Revised: 03/13/2020 Document Reviewed: 03/13/2020 ?Elsevier Patient Education ? [place]. ?Kegel Exercises ?Kegel exercises can help strengthen your pelvic floor muscles. The pelvic floor is a group of muscles that support your rectum, small intestine, and bladder. In females, pelvic floor muscles also help support the uterus. These muscles help you control the flow of urine and stool (feces). ?Kegel exercises are painless and simple. They do not require any equipment. Your provider may suggest Kegel exercises to: ?Improve bladder and bowel control. ?Improve sexual response. ?Improve weak pelvic floor muscles after surgery to remove the uterus (hysterectomy) or after pregnancy, in females. ?Improve weak  pelvic floor muscles after prostate gland removal or surgery, in  males. ?Kegel exercises involve squeezing your pelvic floor muscles. These are the same muscles you squeeze when you try to stop the flow of urine

## 2021-11-18 LAB — SURESWAB® ADVANCED VAGINITIS,TMA
CANDIDA SPECIES: NOT DETECTED
Candida glabrata: NOT DETECTED
SURESWAB(R) ADV BACTERIAL VAGINOSIS(BV),TMA: NEGATIVE
TRICHOMONAS VAGINALIS (TV),TMA: NOT DETECTED

## 2021-11-30 ENCOUNTER — Encounter: Payer: Self-pay | Admitting: Allergy and Immunology

## 2021-11-30 ENCOUNTER — Ambulatory Visit (INDEPENDENT_AMBULATORY_CARE_PROVIDER_SITE_OTHER): Payer: No Typology Code available for payment source | Admitting: Allergy and Immunology

## 2021-11-30 ENCOUNTER — Ambulatory Visit: Payer: Self-pay

## 2021-11-30 VITALS — BP 118/74 | HR 69 | Temp 98.0°F | Resp 18 | Ht 60.5 in | Wt 138.8 lb

## 2021-11-30 DIAGNOSIS — J4520 Mild intermittent asthma, uncomplicated: Secondary | ICD-10-CM

## 2021-11-30 DIAGNOSIS — J309 Allergic rhinitis, unspecified: Secondary | ICD-10-CM | POA: Diagnosis not present

## 2021-11-30 DIAGNOSIS — T781XXD Other adverse food reactions, not elsewhere classified, subsequent encounter: Secondary | ICD-10-CM

## 2021-11-30 DIAGNOSIS — J3089 Other allergic rhinitis: Secondary | ICD-10-CM

## 2021-11-30 DIAGNOSIS — J301 Allergic rhinitis due to pollen: Secondary | ICD-10-CM

## 2021-11-30 NOTE — Progress Notes (Signed)
? ?[facility] ? ? ?Follow-up Note ? ?Referring Provider: Flossie Buffy, NP ?Primary Provider: Flossie Buffy, NP ?Date of Office Visit: 11/30/2021 ? ?Subjective:  ? ?Yolanda [name] (DOB: 15-Aug-1961) is a 61 y.o. female who returns to the [facility] on 11/30/2021 in re-evaluation of the following: ? ?HPI: Tessah returns to this clinic in evaluation of asthma, allergic rhinitis, and oral allergy syndrome.  Her last visit to this clinic was 24 November 2020. ? ?She continues on immunotherapy every 4 weeks without any adverse effect.  This form of therapy has really resulted in very good control of all of her multiorgan atopic disease including her asthma and allergic rhinitis and her oral allergy syndrome.   ? ?Rarely does she use a short acting bronchodilator at this point in time.  It is only when she is playing tennis and extremely hot outside that she will have to use a short acting bronchodilator.  She does continue to use Nasonex and she also continues to use montelukast on a consistent basis especially during the spring and fall.  She has not required a systemic steroid or an antibiotic for any type of airway issue. ? ?Her ability to eat fresh fruits is much better at this point in time.  She can now take a few bites of an apple without any problem but she still gets some stomach upset if she eats a whole apple.  She can eat a banana. ? ?Allergies as of 11/30/2021   ? ?   Reactions  ? Honey Bee Treatment [bee Venom] Anaphylaxis  ? Eggs Or Egg-derived Products   ? Stomach ache  ? Methocarbamol   ? Peanut-containing Drug Products Other (See Comments)  ? Allergy testing  ? Sesame Seed (diagnostic) Other (See Comments)  ? Allergy test  ? Soy Allergy Other (See Comments)  ? Allergy test  ? Wheat Bran   ? Intolerance   ? ?  ? ?  ?Medication List  ? ? ?albuterol 108 (90 Base) MCG/ACT inhaler ?Commonly known as: VENTOLIN HFA ?Inhale 2 puffs into the lungs  every 6 (six) hours as needed for wheezing or shortness of breath. ?  ?buPROPion 150 MG 24 hr tablet ?Commonly known as: WELLBUTRIN XL ?Take 150 mg by mouth 2 (two) times daily. ?  ?CALCIUM 600 PO ?Take 1 tablet by mouth daily. ?  ?EPINEPHrine 0.3 mg/0.3 mL Soaj injection ?Commonly known as: EPI-PEN ?Inject 0.3 mg into the muscle as needed for anaphylaxis. ?  ?Estradiol 10 MCG Tabs vaginal tablet ?INSERT 1 TABLET VAGINALLY EVERY NIGHT AT BEDTIME FOR 1 WEEK THEN TWICE WEEKLY AT BEDTIME. ?  ?LORazepam 0.5 MG tablet ?Commonly known as: ATIVAN ?Take 0.5 mg by mouth daily as needed for sleep. ?  ?MAGNESIUM GLYCINATE PLUS PO ?Take 2 capsules by mouth at bedtime. ?  ?mometasone 50 MCG/ACT nasal spray ?Commonly known as: NASONEX ?SHAKE LIQUID AND USE 2 SPRAYS IN EACH NOSTRIL DAILY ?  ?montelukast 10 MG tablet ?Commonly known as: SINGULAIR ?TAKE 1 TABLET(10 MG) BY MOUTH AT BEDTIME ?  ?multivitamin tablet ?Take 1 tablet by mouth 2 (two) times daily. ?  ?olopatadine 0.1 % ophthalmic solution ?Commonly known as: PATANOL ?INSTILL 1 DROP IN BOTH EYES TWICE DAILY ?  ?OMEGAPURE 780 EC PO ?Take 1 tablet by mouth daily. ?  ? ?Past Medical History:  ?Diagnosis Date  ? Acute appendicitis 09/14/2018  ? Allergy   ? Anemia   ?  Anxiety   ? Asthma   ? BRCA negative 12/2014  ? Depression   ? Endometriosis   ? Esophagitis   ? Fibroid   ? GERD (gastroesophageal reflux disease)   ? Hiatal hernia   ? History of diverticulitis   ? IC (interstitial cystitis)   ? Osteoporosis 01/2019  ? 2018 T score -2.6, 2020 T score -2.0 on Actonel  ? Schatzki's ring   ? ? ?Past Surgical History:  ?Procedure Laterality Date  ? CHOLECYSTECTOMY    ? HYSTEROSCOPY  12/2003  ? RESECTON OF SUBMUCOUS  MYOMA/DX SCOPE  ? LAPAROSCOPIC APPENDECTOMY N/A 09/14/2018  ? Procedure: APPENDECTOMY LAPAROSCOPIC;  Surgeon: Ileana Roup, MD;  Location: [facility];  Service: General;  Laterality: N/A;  ? LAPAROSCOPIC BILATERAL SALPINGO OOPHERECTOMY Bilateral 07/02/2014  ? Procedure:  LAPAROSCOPIC BILATERAL SALPINGO OOPHORECTOMY;  Surgeon: Anastasio Auerbach, MD;  Location: [facility];  Service: Gynecology;  Laterality: Bilateral;  ? PELVIC LAPAROSCOPY  12/2003  ? DX SCOPE/RESECTION OF SUBMUCOUS MYOMA  ? TMJ ARTHROSCOPY  11-26-2001  ? VAGINAL HYSTERECTOMY  12/05  ? Menorrhagia/dysmenorrhea. Pathology showed leiomyoma/adenomyosis  ? ? ?Review of systems negative except as noted in HPI / PMHx or noted below: ? ?Review of Systems  ?Constitutional: Negative.   ?HENT: Negative.    ?Eyes: Negative.   ?Respiratory: Negative.    ?Cardiovascular: Negative.   ?Gastrointestinal: Negative.   ?Genitourinary: Negative.   ?Musculoskeletal: Negative.   ?Skin: Negative.   ?Neurological: Negative.   ?Endo/Heme/Allergies: Negative.   ?Psychiatric/Behavioral: Negative.    ? ? ?Objective:  ? ?Vitals:  ? 11/30/21 1019  ?BP: 118/74  ?Pulse: 69  ?Resp: 18  ?Temp: 98 ?F (36.7 ?C)  ?SpO2: 98%  ? ?Height: 5' 0.5" (153.7 cm)  ?Weight: 138 lb 12.8 oz (63 kg)  ? ?Physical Exam ?Constitutional:   ?   Appearance: She is not diaphoretic.  ?HENT:  ?   Head: Normocephalic.  ?   Right Ear: Tympanic membrane, ear canal and external ear normal.  ?   Left Ear: Tympanic membrane, ear canal and external ear normal.  ?   Nose: Nose normal. No mucosal edema or rhinorrhea.  ?   Mouth/Throat:  ?   Pharynx: Uvula midline. No oropharyngeal exudate.  ?Eyes:  ?   Conjunctiva/sclera: Conjunctivae normal.  ?Neck:  ?   Thyroid: No thyromegaly.  ?   Trachea: Trachea normal. No tracheal tenderness or tracheal deviation.  ?Cardiovascular:  ?   Rate and Rhythm: Normal rate and regular rhythm.  ?   Heart sounds: Normal heart sounds, S1 normal and S2 normal. No murmur heard. ?Pulmonary:  ?   Effort: No respiratory distress.  ?   Breath sounds: Normal breath sounds. No stridor. No wheezing or rales.  ?Lymphadenopathy:  ?   Head:  ?   Right side of head: No tonsillar adenopathy.  ?   Left side of head: No tonsillar adenopathy.  ?   Cervical: No cervical  adenopathy.  ?Skin: ?   Findings: No erythema or rash.  ?   Nails: There is no clubbing.  ?Neurological:  ?   Mental Status: She is alert.  ? ? ?Diagnostics:  ?  ?Spirometry was performed and demonstrated an FEV1 of 1.97 at 89 % of predicted. ? ?Assessment and Plan:  ? ?1. Asthma, mild intermittent, well-controlled   ?2. Perennial allergic rhinitis   ?3. Seasonal allergic rhinitis due to pollen   ?4. Oral allergy syndrome, subsequent encounter   ? ? ?1.  Continue  immunotherapy.    ? ?2. Continue Nasonex 1-2 sprays each nostril 1-2 times per day during periods of upper airway symptoms ? ?3. Attempt to discontinue montelukast ? ?4.  If needed: ? ? A.  Pro-air HFA or similar 2 inhalations every 4-6 hours. ? B.  OTC Pataday ? C.  EpiPen Cammie Sickle, Benadryl, MD/ER for severe allergic reaction ? D.  OTC Antihistamine ? ?5.  Return to clinic in 12 months or earlier if problem ? ?Aveyah is really doing very well and we will continue her on immunotherapy currently at every 4 weeks.  She has completed 3 years of immunotherapy and will aim for a total of 5 years and then make a decision about whether or not to continue this form of treatment.  All of her other medications can be utilized as needed.  We will see if we can discontinue her montelukast at this point. ? ?Allena Katz, MD ?Allergy / Immunology ?[facility] ?

## 2021-11-30 NOTE — Patient Instructions (Addendum)
?  1.  Continue immunotherapy.    ? ?2. Continue Nasonex 1-2 sprays each nostril 1-2 times per day during periods of upper airway symptoms ? ?3. Attempt to discontinue montelukast ? ?4.  If needed: ? ? A.  Pro-air HFA or similar 2 inhalations every 4-6 hours. ? B.  OTC Pataday ? C.  EpiPen Cammie Sickle, Benadryl, MD/ER for severe allergic reaction ? D.  OTC Antihistamine ? ?5.  Return to clinic in 12 months or earlier if problem ? ?  ? ?  ?

## 2021-12-01 ENCOUNTER — Encounter: Payer: Self-pay | Admitting: Allergy and Immunology

## 2021-12-01 DIAGNOSIS — J302 Other seasonal allergic rhinitis: Secondary | ICD-10-CM

## 2021-12-01 MED ORDER — MONTELUKAST SODIUM 10 MG PO TABS: 10.0000 mg | ORAL_TABLET | Freq: Every day | ORAL | 5 refills | Status: DC

## 2021-12-01 MED ORDER — MOMETASONE FUROATE 50 MCG/ACT NA SUSP: 2.0000 | Freq: Every day | NASAL | 5 refills | Status: DC

## 2021-12-01 MED ORDER — ALBUTEROL SULFATE HFA 108 (90 BASE) MCG/ACT IN AERS: 2.0000 | INHALATION_SPRAY | Freq: Four times a day (QID) | RESPIRATORY_TRACT | 2 refills | Status: DC | PRN

## 2021-12-01 MED ORDER — EPINEPHRINE 0.3 MG/0.3ML IJ SOAJ: 0.3000 mg | INTRAMUSCULAR | 2 refills | Status: AC | PRN

## 2021-12-01 NOTE — Progress Notes (Signed)
VIALS EXP 12-02-22 ?

## 2021-12-03 DIAGNOSIS — J301 Allergic rhinitis due to pollen: Secondary | ICD-10-CM

## 2021-12-27 ENCOUNTER — Ambulatory Visit (INDEPENDENT_AMBULATORY_CARE_PROVIDER_SITE_OTHER): Payer: No Typology Code available for payment source

## 2021-12-27 DIAGNOSIS — J309 Allergic rhinitis, unspecified: Secondary | ICD-10-CM

## 2022-01-03 LAB — HM MAMMOGRAPHY

## 2022-01-04 ENCOUNTER — Encounter: Payer: Self-pay | Admitting: Obstetrics and Gynecology

## 2022-01-10 ENCOUNTER — Encounter: Payer: Self-pay | Admitting: Nurse Practitioner

## 2022-01-10 DIAGNOSIS — G8929 Other chronic pain: Secondary | ICD-10-CM

## 2022-01-20 ENCOUNTER — Ambulatory Visit (INDEPENDENT_AMBULATORY_CARE_PROVIDER_SITE_OTHER): Payer: No Typology Code available for payment source

## 2022-01-20 ENCOUNTER — Encounter: Payer: Self-pay | Admitting: Orthopedic Surgery

## 2022-01-20 ENCOUNTER — Ambulatory Visit (INDEPENDENT_AMBULATORY_CARE_PROVIDER_SITE_OTHER): Payer: No Typology Code available for payment source | Admitting: Orthopedic Surgery

## 2022-01-20 DIAGNOSIS — M25521 Pain in right elbow: Secondary | ICD-10-CM

## 2022-01-20 DIAGNOSIS — J309 Allergic rhinitis, unspecified: Secondary | ICD-10-CM | POA: Diagnosis not present

## 2022-01-20 DIAGNOSIS — G8929 Other chronic pain: Secondary | ICD-10-CM | POA: Diagnosis not present

## 2022-01-20 NOTE — Progress Notes (Signed)
Office Visit Note   Patient: Yolanda [name]           Date of Birth: [birth year]           MRN: [id number] Visit Date: 01/20/2022              Requested by: Charyl Dancer, NP [place],  [place] [address] PCP: Flossie Buffy, NP   Assessment & Plan: Visit Diagnoses:  1. Chronic pain of right elbow   2. Right elbow pain     Plan: Discussed with patient that her history exam findings seem most consistent with medial epicondylitis.  We reviewed the nature of medial epicondylitis as well as his diagnosis, prognosis, both conservative and surgical treatment options.  She has tried multiple conservative treatment modalities including corticosteroid injection, oral anti-inflammatory medication, topical anti-inflammatory medication.  She never had physical therapy.  After discussion, she would like to proceed with an MRI to further evaluate the elbow.  I can see her back in the office once the MRI is completed.  Follow-Up Instructions: No follow-ups on file.   Orders:  Orders Placed This Encounter  Procedures   MR Elbow Left w/o contrast   MR Elbow Right w/o contrast   No orders of the defined types were placed in this encounter.     Procedures: No procedures performed   Clinical Data: No additional findings.   Subjective: Chief Complaint  Patient presents with   Right Elbow - New Patient (Initial Visit)    This is a 61 year old right-hand-dominant female who presents with right medial elbow pain.  This all started about a year and a half ago after a ground-level fall in which she caught her self with both arms extended.  Since that time she described aching pain at the medial aspect of the right elbow.  The character of the pain is largely unchanged and so was first noticed.  She has pain that wakes her up at night.  She has pain here today with her activities that involve manipulating objects with her hands.  The example she gives is opening doors or  twisting jars.  She also describes pain at the medial elbow that is sharp in nature when making contact with the ball during golf or tennis.  She had a corticosteroid injection which she says provided 2 months or so of relief.  She has tried Voltaren gel.  She tried an oral anti-inflammatory medication.  She never had any formal therapy.  She denies any subjective instability.  She denies any mechanical symptoms like clicking, locking, or catching.   Review of Systems   Objective: Vital Signs: There were no vitals taken for this visit.  Physical Exam Constitutional:      Appearance: Normal appearance.  Cardiovascular:     Rate and Rhythm: Normal rate.     Pulses: Normal pulses.  Pulmonary:     Effort: Pulmonary effort is normal.  Skin:    General: Skin is warm and dry.     Capillary Refill: Capillary refill takes less than 2 seconds.  Neurological:     Mental Status: She is alert.    Right Elbow Exam   Tenderness  The patient is experiencing tenderness in the medial epicondyle.   Range of Motion  The patient has normal right elbow ROM.  Muscle Strength  The patient has normal right elbow strength.  Other  Erythema: absent Sensation: normal Pulse: present  Comments:  No varus or valgus instability.  TTP directly over medial epicondyle and just distal.  Questionable Tinel over ulnar nerve.  Mild discomfort w/ resisted wrist and middle finger flexion with elbow extended.      Specialty Comments:  No specialty comments available.  Imaging: No results found.   PMFS History: Patient Active Problem List   Diagnosis Date Noted   Chronic pain of right elbow 10/22/2021   Medial epicondylitis of elbow, left 06/15/2020   Renal cyst, right 09/19/2019   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/18/2019   Lumbar radiculopathy 09/09/2019   Low back pain 05/27/2019   Closed fracture of coccyx ([place]) 12/04/2018   Pain in the coccyx 12/04/2018   Well woman exam without  gynecological exam 03/20/2018   Food allergy 03/20/2018   SOB (shortness of breath) on exertion 03/20/2018   Genetic testing 12/23/2014   Family history of breast cancer    Family history of uterine cancer    Osteoporosis 10/04/2014   Pancreatic duct dilated 05/10/2012   IBS 08/01/2008   UNS ADVRS EFF UNS RX MEDICINAL&BIOLOGICAL SBSTNC 01/21/2008   Depression, major, single episode, complete remission ([place]) 01/18/2008   RHINITIS, CHRONIC 01/18/2008   Past Medical History:  Diagnosis Date   Acute appendicitis 09/14/2018   Allergy    Anemia    Anxiety    Asthma    BRCA negative 12/2014   Depression    Endometriosis    Esophagitis    Fibroid    GERD (gastroesophageal reflux disease)    Hiatal hernia    History of diverticulitis    IC (interstitial cystitis)    Osteoporosis 01/2019   2018 T score -2.6, 2020 T score -2.0 on Actonel   Schatzki's ring     Family History  Problem Relation Age of Onset   Uterine cancer Maternal Grandmother        dx in her 58s   Breast cancer Sister        paternal half sister dx in her early 3s   Cystic fibrosis Other        brother's granddauther   Emphysema Father    Early death Father    Breast cancer Sister        paternal half sister dx in her late 1s   Cystic fibrosis Other        brothers granddaughters   Depression Mother    Hyperlipidemia Mother    Hypertension Mother    Mental illness Mother    Miscarriages / [place] Mother    Diabetes Brother 22       HALF BROTHER/HALF BROTHER    Breast cancer Sister    GI problems Brother    Colon cancer Neg Hx    Esophageal cancer Neg Hx    Rectal cancer Neg Hx    Stomach cancer Neg Hx    Allergic rhinitis Neg Hx    Asthma Neg Hx    Eczema Neg Hx    Urticaria Neg Hx     Past Surgical History:  Procedure Laterality Date   CHOLECYSTECTOMY     HYSTEROSCOPY  12/2003   RESECTON OF SUBMUCOUS  MYOMA/DX SCOPE   LAPAROSCOPIC APPENDECTOMY N/A 09/14/2018   Procedure: APPENDECTOMY  LAPAROSCOPIC;  Surgeon: Ileana Roup, MD;  Location: [facility];  Service: General;  Laterality: N/A;   LAPAROSCOPIC BILATERAL SALPINGO OOPHERECTOMY Bilateral 07/02/2014   Procedure: LAPAROSCOPIC BILATERAL SALPINGO OOPHORECTOMY;  Surgeon: Anastasio Auerbach, MD;  Location: [facility];  Service: Gynecology;  Laterality: Bilateral;   PELVIC LAPAROSCOPY  12/2003  DX SCOPE/RESECTION OF SUBMUCOUS MYOMA   TMJ ARTHROSCOPY  11-26-2001   VAGINAL HYSTERECTOMY  12/05   Menorrhagia/dysmenorrhea. Pathology showed leiomyoma/adenomyosis   Social History   Occupational History   Not on file  Tobacco Use   Smoking status: Never   Smokeless tobacco: Never  Vaping Use   Vaping Use: Never used  Substance and Sexual Activity   Alcohol use: Yes    Alcohol/week: 2.0 standard drinks    Types: 2 Glasses of wine per week    Comment: week   Drug use: No   Sexual activity: Yes    Birth control/protection: Surgical    Comment: HYST-1st intercourse 61 yo-Fewer than 5 partners

## 2022-01-31 ENCOUNTER — Encounter: Payer: Self-pay | Admitting: Obstetrics and Gynecology

## 2022-01-31 NOTE — Telephone Encounter (Signed)
Please cancel any dexa order at [facility], she is getting her DEXA here and isn't due until next year. Thanks

## 2022-01-31 NOTE — Telephone Encounter (Signed)
Order canceled at [facility].

## 2022-02-14 ENCOUNTER — Ambulatory Visit
Admission: RE | Admit: 2022-02-14 | Discharge: 2022-02-14 | Disposition: A | Discharge status: Home / Self Care | Payer: No Typology Code available for payment source | Source: Ambulatory Visit | Attending: Orthopedic Surgery | Admitting: Orthopedic Surgery

## 2022-02-14 ENCOUNTER — Other Ambulatory Visit: Payer: No Typology Code available for payment source

## 2022-02-14 DIAGNOSIS — M25521 Pain in right elbow: Secondary | ICD-10-CM

## 2022-02-14 DIAGNOSIS — G8929 Other chronic pain: Secondary | ICD-10-CM

## 2022-02-17 ENCOUNTER — Ambulatory Visit (INDEPENDENT_AMBULATORY_CARE_PROVIDER_SITE_OTHER): Payer: No Typology Code available for payment source | Admitting: Orthopedic Surgery

## 2022-02-17 DIAGNOSIS — G8929 Other chronic pain: Secondary | ICD-10-CM

## 2022-02-17 DIAGNOSIS — M25521 Pain in right elbow: Secondary | ICD-10-CM | POA: Diagnosis not present

## 2022-02-17 NOTE — Progress Notes (Signed)
Office Visit Note   Patient: Yolanda [name]           Date of Birth: August 24, 1960           MRN: [id number] Visit Date: 02/17/2022              Requested by: Flossie Buffy, NP [place],  [place] [address] PCP: Flossie Buffy, NP   Assessment & Plan: Visit Diagnoses:  1. Chronic pain of right elbow     Plan: We reviewed patient's recent MRI which suggests mild flexor and extensor tendinosis.  We again discussed the nature of medial epicondylitis.  Patient has never tried a formal therapy program so will refer her to OT.  She has occasional and intermittent numbness/paresthesias in the ring and small fingers.  This is not as bothersome to her as the dull, aching pain.  We discussed the nature of cubital tunnel syndrome.  We will start with therapy for now and will consider an EMG/NCS if her symptoms continue despite therapy.   Follow-Up Instructions: No follow-ups on file.   Orders:  No orders of the defined types were placed in this encounter.  No orders of the defined types were placed in this encounter.     Procedures: No procedures performed   Clinical Data: No additional findings.   Subjective: Chief Complaint  Patient presents with   Right Elbow - Follow-up    MRI Review    This is a 61 yo RHD F who presents for follow up of a right elbow MRI.  The MRI suggested mild flexor and extensor tendinosis.  Her symptoms have continued.  She was able to play both golf and tennis yesterday with minimal discomfort while playing but worse medial elbow pain this morning.  She has been using voltaren gel. She has tried a corticosteroid injection.  She has never tried formal therapy.    Review of Systems   Objective: Vital Signs: There were no vitals taken for this visit.  Physical Exam  Right Elbow Exam   Tenderness  The patient is experiencing tenderness in the medial epicondyle.   Range of Motion  The patient has normal right elbow  ROM.  Muscle Strength  The patient has normal right elbow strength.  Other  Erythema: absent Sensation: normal Pulse: present  Comments:  Mildly positive Tinel at elbow. No obvious ulnar nerve instability.      Specialty Comments:  No specialty comments available.  Imaging: No results found.   PMFS History: Patient Active Problem List   Diagnosis Date Noted   Chronic pain of right elbow 10/22/2021   Medial epicondylitis of elbow, left 06/15/2020   Renal cyst, right 09/19/2019   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/18/2019   Lumbar radiculopathy 09/09/2019   Low back pain 05/27/2019   Closed fracture of coccyx ([employer]) 12/04/2018   Pain in the coccyx 12/04/2018   Well woman exam without gynecological exam 03/20/2018   Food allergy 03/20/2018   SOB (shortness of breath) on exertion 03/20/2018   Genetic testing 12/23/2014   Family history of breast cancer    Family history of uterine cancer    Osteoporosis 10/04/2014   Pancreatic duct dilated 05/10/2012   IBS 08/01/2008   UNS ADVRS EFF UNS RX MEDICINAL&BIOLOGICAL SBSTNC 01/21/2008   Depression, major, single episode, complete remission ([place]) 01/18/2008   RHINITIS, CHRONIC 01/18/2008   Past Medical History:  Diagnosis Date   Acute appendicitis 09/14/2018   Allergy  Anemia    Anxiety    Asthma    BRCA negative 12/2014   Depression    Endometriosis    Esophagitis    Fibroid    GERD (gastroesophageal reflux disease)    Hiatal hernia    History of diverticulitis    IC (interstitial cystitis)    Osteoporosis 01/2019   2018 T score -2.6, 2020 T score -2.0 on Actonel   Schatzki's ring     Family History  Problem Relation Age of Onset   Uterine cancer Maternal Grandmother        dx in her 76s   Breast cancer Sister        paternal half sister dx in her early 59s   Cystic fibrosis Other        brother's granddauther   Emphysema Father    Early death Father    Breast cancer Sister        paternal half  sister dx in her late 63s   Cystic fibrosis Other        brothers granddaughters   Depression Mother    Hyperlipidemia Mother    Hypertension Mother    Mental illness Mother    71 / [place] Mother    Diabetes Brother 35       HALF BROTHER/HALF BROTHER    Breast cancer Sister    GI problems Brother    Colon cancer Neg Hx    Esophageal cancer Neg Hx    Rectal cancer Neg Hx    Stomach cancer Neg Hx    Allergic rhinitis Neg Hx    Asthma Neg Hx    Eczema Neg Hx    Urticaria Neg Hx     Past Surgical History:  Procedure Laterality Date   CHOLECYSTECTOMY     HYSTEROSCOPY  12/2003   RESECTON OF SUBMUCOUS  MYOMA/DX SCOPE   LAPAROSCOPIC APPENDECTOMY N/A 09/14/2018   Procedure: APPENDECTOMY LAPAROSCOPIC;  Surgeon: Ileana Roup, MD;  Location: [facility];  Service: General;  Laterality: N/A;   LAPAROSCOPIC BILATERAL SALPINGO OOPHERECTOMY Bilateral 07/02/2014   Procedure: LAPAROSCOPIC BILATERAL SALPINGO OOPHORECTOMY;  Surgeon: Anastasio Auerbach, MD;  Location: [facility];  Service: Gynecology;  Laterality: Bilateral;   PELVIC LAPAROSCOPY  12/2003   DX SCOPE/RESECTION OF SUBMUCOUS MYOMA   TMJ ARTHROSCOPY  11-26-2001   VAGINAL HYSTERECTOMY  12/05   Menorrhagia/dysmenorrhea. Pathology showed leiomyoma/adenomyosis   Social History   Occupational History   Not on file  Tobacco Use   Smoking status: Never   Smokeless tobacco: Never  Vaping Use   Vaping Use: Never used  Substance and Sexual Activity   Alcohol use: Yes    Alcohol/week: 2.0 standard drinks of alcohol    Types: 2 Glasses of wine per week    Comment: week   Drug use: No   Sexual activity: Yes    Birth control/protection: Surgical    Comment: HYST-1st intercourse 61 yo-Fewer than 5 partners

## 2022-02-21 ENCOUNTER — Ambulatory Visit: Payer: No Typology Code available for payment source | Admitting: Rehabilitative and Restorative Service Providers"

## 2022-02-21 ENCOUNTER — Encounter: Payer: Self-pay | Admitting: Rehabilitative and Restorative Service Providers"

## 2022-02-21 ENCOUNTER — Other Ambulatory Visit: Payer: Self-pay

## 2022-02-21 DIAGNOSIS — R202 Paresthesia of skin: Secondary | ICD-10-CM

## 2022-02-21 DIAGNOSIS — M25511 Pain in right shoulder: Secondary | ICD-10-CM | POA: Diagnosis not present

## 2022-02-21 DIAGNOSIS — M25631 Stiffness of right wrist, not elsewhere classified: Secondary | ICD-10-CM

## 2022-02-21 DIAGNOSIS — M25521 Pain in right elbow: Secondary | ICD-10-CM | POA: Diagnosis not present

## 2022-02-21 NOTE — Therapy (Signed)
OUTPATIENT OCCUPATIONAL THERAPY ORTHO EVALUATION  Patient Name: Yolanda [name] MRN: [id number] DOB:03-03-1961, 61 y.o., female Today's Date: 02/21/2022  PCP: Wilfred Lacy, NP REFERRING PROVIDER: Dr. Sherilyn Cooter    OT End of Session - 02/21/22 0801     Visit Number 1    Number of Visits 10    Date for OT Re-Evaluation 04/01/22    Authorization Type AET[employer] no limit    OT Start Time 0801    OT Stop Time 0852    OT Time Calculation (min) 51 min    Activity Tolerance Patient tolerated treatment well;Patient limited by pain;Patient limited by fatigue    Behavior During Therapy [facility] for tasks assessed/performed             Past Medical History:  Diagnosis Date   Acute appendicitis 09/14/2018   Allergy    Anemia    Anxiety    Asthma    BRCA negative 12/2014   Depression    Endometriosis    Esophagitis    Fibroid    GERD (gastroesophageal reflux disease)    Hiatal hernia    History of diverticulitis    IC (interstitial cystitis)    Osteoporosis 01/2019   2018 T score -2.6, 2020 T score -2.0 on Actonel   Schatzki's ring    Past Surgical History:  Procedure Laterality Date   CHOLECYSTECTOMY     HYSTEROSCOPY  12/2003   RESECTON OF SUBMUCOUS  MYOMA/DX SCOPE   LAPAROSCOPIC APPENDECTOMY N/A 09/14/2018   Procedure: APPENDECTOMY LAPAROSCOPIC;  Surgeon: Ileana Roup, MD;  Location: [facility];  Service: General;  Laterality: N/A;   LAPAROSCOPIC BILATERAL SALPINGO OOPHERECTOMY Bilateral 07/02/2014   Procedure: LAPAROSCOPIC BILATERAL SALPINGO OOPHORECTOMY;  Surgeon: Anastasio Auerbach, MD;  Location: [facility];  Service: Gynecology;  Laterality: Bilateral;   PELVIC LAPAROSCOPY  12/2003   DX SCOPE/RESECTION OF SUBMUCOUS MYOMA   TMJ ARTHROSCOPY  11-26-2001   VAGINAL HYSTERECTOMY  12/05   Menorrhagia/dysmenorrhea. Pathology showed leiomyoma/adenomyosis   Patient Active Problem List   Diagnosis Date Noted   Chronic pain of right elbow 10/22/2021   Medial epicondylitis of  elbow, left 06/15/2020   Renal cyst, right 09/19/2019   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/18/2019   Lumbar radiculopathy 09/09/2019   Low back pain 05/27/2019   Closed fracture of coccyx ([place]) 12/04/2018   Pain in the coccyx 12/04/2018   Well woman exam without gynecological exam 03/20/2018   Food allergy 03/20/2018   SOB (shortness of breath) on exertion 03/20/2018   Genetic testing 12/23/2014   Family history of breast cancer    Family history of uterine cancer    Osteoporosis 10/04/2014   Pancreatic duct dilated 05/10/2012   IBS 08/01/2008   UNS ADVRS EFF UNS RX MEDICINAL&BIOLOGICAL SBSTNC 01/21/2008   Depression, major, single episode, complete remission ([place]) 01/18/2008   RHINITIS, CHRONIC 01/18/2008    ONSET DATE: Chronic (over 1.5 years)   REFERRING DIAG: M25.521,G89.29 (ICD-10-CM) - Chronic pain of right elbow  THERAPY DIAG:  Pain in right elbow - Plan: Ot plan of care cert/re-cert  Paresthesia of skin - Plan: Ot plan of care cert/re-cert  Acute pain of right shoulder - Plan: Ot plan of care cert/re-cert  Stiffness of right wrist, not elsewhere classified - Plan: Ot plan of care cert/re-cert  Rationale for Evaluation and Treatment Rehabilitation  SUBJECTIVE:   SUBJECTIVE STATEMENT: She is is currently not working, she "baby sits" 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 She states she fell about a  year and a half ago, landed on palms, had no fx, tried resting, wrists felt better but her elbow still hurts. She states playing golf, tennis, pickle ball. She states those hurt her and that she's tried topicals, ice, and cortisone injection when they unfortunately "hit her nerve."  She is wearing pre-fab wrist cock-up today. She has had numbness and tingling in right SF and RF chronically, too. She also has had right shoulder (biceps tendon) pain in past 1-2 weeks).    PERTINENT HISTORY: Referral from MD for chronic right medial epicondylitis.   PRECAUTIONS:  She has many common food allergies   WEIGHT BEARING RESTRICTIONS  WBAT  PAIN:  Are you having pain? Yes Rating: 1/10 at rest, up to 5/10 in last week after sports  FALLS: Has patient fallen in last 6 months? No  LIVING ENVIRONMENT: Lives with: lives with their family  PLOF: Independent  PATIENT GOALS have less pain during life and functional activities    OBJECTIVE:   HAND DOMINANCE: Right   ADLs: Overall ADLs: States pumping a soap bottle, opening jars, opening doors, play sports, etc.   FUNCTIONAL OUTCOME MEASURES: Eval: Patient Specific Functional Scale: 4.6 (play sports, open jars, care for grandson)  UPPER EXTREMITY ROM     Active ROM Right eval  Elbow flexion 154 ("burns" @ lbow)  Elbow extension (-9) (b/l)  Wrist flexion 73  Wrist extension 64 (pain)  Wrist pronation 85  Wrist supination 79  (Blank rows = not tested)   UPPER EXTREMITY MMT:     MMT Right eval  Elbow flexion 4/5 tender  Elbow extension   Wrist flexion 3+/5 pain  Wrist extension 4+/5 tender  Wrist pronation 5/5  Wrist supination 5/5  (Blank rows = not tested)  HAND FUNCTION: Eval: Grip strength Right: TBD lbs, Left: TBD lbs   COORDINATION: Eval: Box and Blocks Test: TBD Blocks today (TBD is [facility]); 9 Hole Peg Test Right: TBDsec, Left: TBD sec (TBD sec is WFL)   SENSATION: Eval:  Light touch intact today, though states having diminished sensation in ulnar nerve distribution, especially at night.   EDEMA:   Eval: Very mildly swollen in wrist today  COGNITION: Overall cognitive status: WFL for evaluation today   OBSERVATIONS:   Eval: Right arm: she is mildly TTP to biceps tendon and some soreness with biceps resisted (but neg Speed's test), ECU area is not TTP but she states that is where she normally has pain in wrist. Slight pain resisted wrist ext, very painful resisted finger flexion, positive tinnel test at ulnar nerve @ elbow. Triceps also TTPs somewhat.    TODAY'S  TREATMENT:  Eval: Due to chronic nature, OT emphasizes rest from dynamic forceful activities now (weight lifting, sports multiple times a week, etc.) for at least 3-4 weeks.  She states understanding.  OT also edu on modalities, and HEP as below to begin. She demo's back with no added pain, states understanding.   Exercises - Doorway Stretches (both arms low, lean in gently)   - 3-4 x daily - 3-5 reps - 15 hold - Tricep Stretch- DO SEATED BY TABLE  - 3-4 x daily - 3-5 reps - 15 hold - Seated Wrist Flexion with Overpressure  - 3-4 x daily - 3-5 reps - 15 sec hold - Wrist Extension Stretch Pronated  - 3-4 x daily - 3-5 reps - 15 hold   PATIENT EDUCATION: Education details: See tx section above for details  Person educated: Patient Education method: Veterinary surgeon,  Teach back, Handouts  Education comprehension: States and demonstrates understanding, Additional Education required    HOME EXERCISE PROGRAM: Access Code: [email or web address] URL: [email or web address] Date: 02/21/2022 Prepared by: Benito Mccreedy  GOALS: Goals reviewed with patient? Yes   SHORT TERM GOALS: (STG required if POC>30 days)  Pt will obtain protective, custom orthotic. Target date: TBD PRN Goal status: INITIAL  2.  Pt will demo/state understanding of initial HEP to improve pain levels and prerequisite motion. Target date: 03/11/22 Goal status: INITIAL   LONG TERM GOALS:  Pt will improve functional ability by decreased impairment per PSFS assessment from 4.6 to 8 or better, for better quality of life. Target date: 04/01/22 Goal status: INITIAL  2.  Pt will improve grip strength in right hand to at least 55lbs for functional use at home and in IADLs. Target date: 04/01/22 Goal status: INITIAL  3.  Pt will improve A/ROM in right wrist ext from painful 64* to at least non-painful 70*, to have functional motion for tasks like reach and grasp.  Target date: 04/01/22 Goal status: INITIAL  4.  Pt  will improve strength in right wrist flexion from painful 3+/5 MMT to at least 4+/5 MMT to have increased functional ability to carry out selfcare and higher-level homecare tasks with no difficulty. Target date: 04/01/22 Goal status: INITIAL  5.  Pt will decrease pain at worst from 5/10 to 1-2/10 or better to have better sleep and occupational participation in daily roles. Target date: 04/01/22 Goal status: INITIAL   ASSESSMENT:  CLINICAL IMPRESSION: Patient is a 61 y.o. female who was seen today for occupational therapy evaluation for right elbow pain and decreased functional activity.    PERFORMANCE DEFICITS in functional skills including ADLs, IADLs, coordination, ROM, strength, pain, fascial restrictions, muscle spasms, flexibility, body mechanics, endurance, decreased knowledge of precautions, and UE functional use, cognitive skills including problem solving and safety awareness, and psychosocial skills including coping strategies, environmental adaptation, habits, interpersonal interactions, and routines and behaviors.   IMPAIRMENTS are limiting patient from ADLs, IADLs, rest and sleep, work, play, leisure, and social participation.   COMORBIDITIES may have co-morbidities  that affects occupational performance. Patient will benefit from skilled OT to address above impairments and improve overall function.  MODIFICATION OR ASSISTANCE TO COMPLETE EVALUATION: Min-Moderate modification of tasks or assist with assess necessary to complete an evaluation.  OT OCCUPATIONAL PROFILE AND HISTORY: Detailed assessment: Review of records and additional review of physical, cognitive, psychosocial history related to current functional performance.  CLINICAL DECISION MAKING: Moderate - several treatment options, min-mod task modification necessary  REHAB POTENTIAL: Good  EVALUATION COMPLEXITY: Moderate      PLAN: OT FREQUENCY: 2x/week  OT DURATION: 6 weeks  PLANNED INTERVENTIONS: self  care/ADL training, therapeutic exercise, therapeutic activity, neuromuscular re-education, manual therapy, passive range of motion, splinting, electrical stimulation, ultrasound, fluidotherapy, moist heat, cryotherapy, contrast bath, patient/family education, and coping strategies training  RECOMMENDED OTHER SERVICES: none now   CONSULTED AND AGREED WITH PLAN OF CARE: Patient  PLAN FOR NEXT SESSION: Check initial HEP, focus on pain and loss of motion to begin with modalities, manual therapy, avoidance of painful activities for 3-4 weeks at least    [employer], OTR/L, CHT 02/21/2022, 6:57 PM

## 2022-02-23 ENCOUNTER — Ambulatory Visit (INDEPENDENT_AMBULATORY_CARE_PROVIDER_SITE_OTHER): Payer: No Typology Code available for payment source

## 2022-02-23 DIAGNOSIS — J309 Allergic rhinitis, unspecified: Secondary | ICD-10-CM | POA: Diagnosis not present

## 2022-02-28 ENCOUNTER — Encounter: Payer: Self-pay | Admitting: Rehabilitative and Restorative Service Providers"

## 2022-02-28 ENCOUNTER — Ambulatory Visit (INDEPENDENT_AMBULATORY_CARE_PROVIDER_SITE_OTHER): Payer: No Typology Code available for payment source | Admitting: Rehabilitative and Restorative Service Providers"

## 2022-02-28 ENCOUNTER — Ambulatory Visit (INDEPENDENT_AMBULATORY_CARE_PROVIDER_SITE_OTHER): Payer: No Typology Code available for payment source | Admitting: *Deleted

## 2022-02-28 DIAGNOSIS — R202 Paresthesia of skin: Secondary | ICD-10-CM | POA: Diagnosis not present

## 2022-02-28 DIAGNOSIS — M25511 Pain in right shoulder: Secondary | ICD-10-CM

## 2022-02-28 DIAGNOSIS — M25521 Pain in right elbow: Secondary | ICD-10-CM

## 2022-02-28 DIAGNOSIS — M25631 Stiffness of right wrist, not elsewhere classified: Secondary | ICD-10-CM | POA: Diagnosis not present

## 2022-02-28 DIAGNOSIS — J309 Allergic rhinitis, unspecified: Secondary | ICD-10-CM

## 2022-02-28 NOTE — Therapy (Signed)
OUTPATIENT OCCUPATIONAL THERAPY TREATMENT NOTE   Patient Name: Yolanda [name] MRN: [id number] DOB:February 03, 1961, 61 y.o., female Today's Date: 02/28/2022  PCP: Wilfred Lacy, NP REFERRING PROVIDER: Dr. Sherilyn Cooter   END OF SESSION:   OT End of Session - 02/28/22 1021     Visit Number 2    Number of Visits 10    Date for OT Re-Evaluation 04/01/22    Authorization Type AET[employer] no limit    OT Start Time 1021    OT Stop Time 1103    OT Time Calculation (min) 42 min    Equipment Utilized During Treatment k-tape    Activity Tolerance Patient tolerated treatment well;Patient limited by pain;Patient limited by fatigue    Behavior During Therapy [facility] for tasks assessed/performed             Past Medical History:  Diagnosis Date   Acute appendicitis 09/14/2018   Allergy    Anemia    Anxiety    Asthma    BRCA negative 12/2014   Depression    Endometriosis    Esophagitis    Fibroid    GERD (gastroesophageal reflux disease)    Hiatal hernia    History of diverticulitis    IC (interstitial cystitis)    Osteoporosis 01/2019   2018 T score -2.6, 2020 T score -2.0 on Actonel   Schatzki's ring    Past Surgical History:  Procedure Laterality Date   CHOLECYSTECTOMY     HYSTEROSCOPY  12/2003   RESECTON OF SUBMUCOUS  MYOMA/DX SCOPE   LAPAROSCOPIC APPENDECTOMY N/A 09/14/2018   Procedure: APPENDECTOMY LAPAROSCOPIC;  Surgeon: Ileana Roup, MD;  Location: [place] [facility];  Service: General;  Laterality: N/A;   LAPAROSCOPIC BILATERAL SALPINGO OOPHERECTOMY Bilateral 07/02/2014   Procedure: LAPAROSCOPIC BILATERAL SALPINGO OOPHORECTOMY;  Surgeon: Anastasio Auerbach, MD;  Location: [place] [facility];  Service: Gynecology;  Laterality: Bilateral;   PELVIC LAPAROSCOPY  12/2003   DX SCOPE/RESECTION OF SUBMUCOUS MYOMA   TMJ ARTHROSCOPY  11-26-2001   VAGINAL HYSTERECTOMY  12/05   Menorrhagia/dysmenorrhea. Pathology showed leiomyoma/adenomyosis   Patient Active Problem List   Diagnosis Date Noted    Chronic pain of right elbow 10/22/2021   Medial epicondylitis of elbow, left 06/15/2020   Renal cyst, right 09/19/2019   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/18/2019   Lumbar radiculopathy 09/09/2019   Low back pain 05/27/2019   Closed fracture of coccyx ([place]) 12/04/2018   Pain in the coccyx 12/04/2018   Well woman exam without gynecological exam 03/20/2018   Food allergy 03/20/2018   SOB (shortness of breath) on exertion 03/20/2018   Genetic testing 12/23/2014   Family history of breast cancer    Family history of uterine cancer    Osteoporosis 10/04/2014   Pancreatic duct dilated 05/10/2012   IBS 08/01/2008   UNS ADVRS EFF UNS RX MEDICINAL&BIOLOGICAL SBSTNC 01/21/2008   Depression, major, single episode, complete remission ([place]) 01/18/2008   RHINITIS, CHRONIC 01/18/2008    ONSET DATE: Chronic (over 1.5 years)    REFERRING DIAG: M25.521,G89.29 (ICD-10-CM) - Chronic pain of right elbow  THERAPY DIAG:  Pain in right elbow  Paresthesia of skin  Stiffness of right wrist, not elsewhere classified  Acute pain of right shoulder  Rationale for Evaluation and Treatment Rehabilitation  PERTINENT HISTORY: Referral from MD for chronic right medial epicondylitis.    PRECAUTIONS: She has many common food allergies    WEIGHT BEARING RESTRICTIONS  WBAT  SUBJECTIVE:  She states shoulder was a bit painful with doorway stretch  and had to stop.   PAIN:  Are you having pain? No, not unless moving Rating: 0/10 at rest now    OBJECTIVE: (All objective assessments below are from initial evaluation on: 02/21/22 unless otherwise specified.)   HAND DOMINANCE: Right    ADLs: Overall ADLs: States pumping a soap bottle, opening jars, opening doors, play sports, etc.    FUNCTIONAL OUTCOME MEASURES: Eval: Patient Specific Functional Scale: 4.6 (play sports, open jars, care for grandson)   UPPER EXTREMITY ROM      Active ROM Right eval Right TBD  Elbow flexion 154 ("burns" @ lbow)    Elbow extension (-9) (b/l)   Wrist flexion 73   Wrist extension 64 (pain)   Wrist pronation 85   Wrist supination 79   (Blank rows = not tested)     UPPER EXTREMITY MMT:      MMT Right eval  Elbow flexion 4/5 tender  Elbow extension    Wrist flexion 3+/5 pain  Wrist extension 4+/5 tender  Wrist pronation 5/5  Wrist supination 5/5  (Blank rows = not tested)   HAND FUNCTION: 02/28/22: Grip strength Right: 40 lbs, Left: 54 lbs    COORDINATION: Eval: Box and Blocks Test: TBD Blocks today (TBD is [facility]); 9 Hole Peg Test Right: TBDsec, Left: TBD sec (TBD sec is WFL)    SENSATION: Eval:  Light touch intact today, though states having diminished sensation in ulnar nerve distribution, especially at night.    EDEMA:             Eval: Very mildly swollen in wrist today   COGNITION: Overall cognitive status: WFL for evaluation today    OBSERVATIONS:             Eval: Right arm: she is mildly TTP to biceps tendon and some soreness with biceps resisted (but neg Speed's test), ECU area is not TTP but she states that is where she normally has pain in wrist. Slight pain resisted wrist ext, very painful resisted finger flexion, positive tinnel test at ulnar nerve @ elbow. Triceps also TTPs somewhat.      TODAY'S TREATMENT:  02/28/22: OT reviews HEP while she is on [place] and then has her review and perform them with cues and direction to avoid shoulder pains during performance. She doesn't get pain now, but does get some increased numbness at elbow, so OT applies K-tape to help tension elbow in ext and relieve pressure at cubital tunnel, also reviews avoiding exacerbating positions, etc.   Exercises - Doorway Stretches (both arms low, lean in gently)   - 3-4 x daily - 3-5 reps - 15 hold - Standing Bicep Stretch at Wall  - 4 x daily - 3-5 reps - 15 sec hold - Tricep Stretch- DO SEATED BY TABLE  - 3-4 x daily - 3-5 reps - 15 hold - Seated Wrist Flexion with Overpressure  - 3-4 x daily - 3-5 reps  - 15 sec hold - Wrist Extension Stretch Pronated  - 3-4 x daily - 3-5 reps - 15 hold - Ulnar Nerve Flossing  - 3-4 x daily - 1-2 sets - 5-10 reps  Eval: Due to chronic nature, OT emphasizes rest from dynamic forceful activities now (weight lifting, sports multiple times a week, etc.) for at least 3-4 weeks.  She states understanding.  OT also edu on modalities, and HEP as below to begin. She demo's back with no added pain, states understanding.    Exercises - Doorway Stretches (both arms low,  lean in gently)   - 3-4 x daily - 3-5 reps - 15 hold - Tricep Stretch- DO SEATED BY TABLE  - 3-4 x daily - 3-5 reps - 15 hold - Seated Wrist Flexion with Overpressure  - 3-4 x daily - 3-5 reps - 15 sec hold - Wrist Extension Stretch Pronated  - 3-4 x daily - 3-5 reps - 15 hold     PATIENT EDUCATION: Education details: See tx section above for details  Person educated: Patient Education method: Verbal Instruction, Teach back, Handouts  Education comprehension: States and demonstrates understanding, Additional Education required      HOME EXERCISE PROGRAM: Access Code: [email or web address] URL: [email or web address] Prepared by: Benito Mccreedy   GOALS: Goals reviewed with patient? Yes     SHORT TERM GOALS: (STG required if POC>30 days)   Pt will obtain protective, custom orthotic. Target date: TBD PRN Goal status: INITIAL   2.  Pt will demo/state understanding of initial HEP to improve pain levels and prerequisite motion. Target date: 03/11/22 Goal status: INITIAL     LONG TERM GOALS:   Pt will improve functional ability by decreased impairment per PSFS assessment from 4.6 to 8 or better, for better quality of life. Target date: 04/01/22 Goal status: INITIAL   2.  Pt will improve grip strength in right hand to at least 55lbs for functional use at home and in IADLs. Target date: 04/01/22 Goal status: INITIAL   3.  Pt will improve A/ROM in right wrist ext from painful 64* to at  least non-painful 70*, to have functional motion for tasks like reach and grasp.  Target date: 04/01/22 Goal status: INITIAL   4.  Pt will improve strength in right wrist flexion from painful 3+/5 MMT to at least 4+/5 MMT to have increased functional ability to carry out selfcare and higher-level homecare tasks with no difficulty. Target date: 04/01/22 Goal status: INITIAL   5.  Pt will decrease pain at worst from 5/10 to 1-2/10 or better to have better sleep and occupational participation in daily roles. 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  Wrist is doing much better and she states not recalling where it normally hurts.   Eval: Patient is a 61 y.o. female who was seen today for occupational therapy evaluation for right elbow pain and decreased functional activity.       PLAN: OT FREQUENCY: 2x/week   OT DURATION: 6 weeks   PLANNED INTERVENTIONS: self care/ADL training, therapeutic exercise, therapeutic activity, neuromuscular re-education, manual therapy, passive range of motion, splinting, electrical stimulation, ultrasound, fluidotherapy, moist heat, cryotherapy, contrast bath, patient/family education, and coping strategies training   RECOMMENDED OTHER SERVICES: none now    CONSULTED AND AGREED WITH PLAN OF CARE: Patient   PLAN FOR NEXT SESSION:  Go over HEP, new nerve glides, ensure all tolerable and pain lower. Add light hand strength, and possibly wrist strength, monitor for ulnar nerve at elbow and Guyon's Canal.  Consider adding light shoulder & elbow strength (isos and eccentrics now, only) as tolerated as well.     Benito Mccreedy, OTR/L, CHT 02/28/2022, 11:09 AM

## 2022-03-04 ENCOUNTER — Ambulatory Visit (INDEPENDENT_AMBULATORY_CARE_PROVIDER_SITE_OTHER): Payer: No Typology Code available for payment source | Admitting: Rehabilitative and Restorative Service Providers"

## 2022-03-04 ENCOUNTER — Encounter: Payer: Self-pay | Admitting: Rehabilitative and Restorative Service Providers"

## 2022-03-04 ENCOUNTER — Encounter: Payer: No Typology Code available for payment source | Admitting: Rehabilitative and Restorative Service Providers"

## 2022-03-04 DIAGNOSIS — R202 Paresthesia of skin: Secondary | ICD-10-CM | POA: Diagnosis not present

## 2022-03-04 DIAGNOSIS — M25521 Pain in right elbow: Secondary | ICD-10-CM

## 2022-03-04 DIAGNOSIS — M25631 Stiffness of right wrist, not elsewhere classified: Secondary | ICD-10-CM | POA: Diagnosis not present

## 2022-03-04 DIAGNOSIS — M25511 Pain in right shoulder: Secondary | ICD-10-CM

## 2022-03-04 NOTE — Therapy (Signed)
OUTPATIENT OCCUPATIONAL THERAPY TREATMENT NOTE   Patient Name: Yolanda [name] MRN: [id number] DOB:09/03/1960, 61 y.o., female Today's Date: 03/04/2022  PCP: Charlotte Nche, NP REFERRING PROVIDER: Dr. Charlie Benfield   END OF SESSION:   OT End of Session - 03/04/22 0942     Visit Number 3    Number of Visits 10    Date for OT Re-Evaluation 04/01/22    Authorization Type AET[employer] no limit    OT Start Time 0942    OT Stop Time 1019    OT Time Calculation (min) 37 min    Equipment Utilized During Treatment k-tape    Activity Tolerance Patient tolerated treatment well;Patient limited by pain;Patient limited by fatigue    Behavior During Therapy WFL for tasks assessed/performed              Past Medical History:  Diagnosis Date   Acute appendicitis 09/14/2018   Allergy    Anemia    Anxiety    Asthma    BRCA negative 12/2014   Depression    Endometriosis    Esophagitis    Fibroid    GERD (gastroesophageal reflux disease)    Hiatal hernia    History of diverticulitis    IC (interstitial cystitis)    Osteoporosis 01/2019   2018 T score -2.6, 2020 T score -2.0 on Actonel   Schatzki's ring    Past Surgical History:  Procedure Laterality Date   CHOLECYSTECTOMY     HYSTEROSCOPY  12/2003   RESECTON OF SUBMUCOUS  MYOMA/DX SCOPE   LAPAROSCOPIC APPENDECTOMY N/A 09/14/2018   Procedure: APPENDECTOMY LAPAROSCOPIC;  Surgeon: White, Christopher M, MD;  Location: [facility];  Service: General;  Laterality: N/A;   LAPAROSCOPIC BILATERAL SALPINGO OOPHERECTOMY Bilateral 07/02/2014   Procedure: LAPAROSCOPIC BILATERAL SALPINGO OOPHORECTOMY;  Surgeon: Timothy P Fontaine, MD;  Location: [facility];  Service: Gynecology;  Laterality: Bilateral;   PELVIC LAPAROSCOPY  12/2003   DX SCOPE/RESECTION OF SUBMUCOUS MYOMA   TMJ ARTHROSCOPY  11-26-2001   VAGINAL HYSTERECTOMY  12/05   Menorrhagia/dysmenorrhea. Pathology showed leiomyoma/adenomyosis   Patient Active Problem List   Diagnosis Date Noted    Chronic pain of right elbow 10/22/2021   Medial epicondylitis of elbow, left 06/15/2020   Renal cyst, right 09/19/2019   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/18/2019   Lumbar radiculopathy 09/09/2019   Low back pain 05/27/2019   Closed fracture of coccyx ([facility]) 12/04/2018   Pain in the coccyx 12/04/2018   Well woman exam without gynecological exam 03/20/2018   Food allergy 03/20/2018   SOB (shortness of breath) on exertion 03/20/2018   Genetic testing 12/23/2014   Family history of breast cancer    Family history of uterine cancer    Osteoporosis 10/04/2014   Pancreatic duct dilated 05/10/2012   IBS 08/01/2008   UNS ADVRS EFF UNS RX MEDICINAL&BIOLOGICAL SBSTNC 01/21/2008   Depression, major, single episode, complete remission ([facility]) 01/18/2008   RHINITIS, CHRONIC 01/18/2008    ONSET DATE: Chronic (over 1.5 years)    REFERRING DIAG: M25.521,G89.29 (ICD-10-CM) - Chronic pain of right elbow  THERAPY DIAG:  Stiffness of right wrist, not elsewhere classified  Paresthesia of skin  Pain in right elbow  Acute pain of right shoulder  Rationale for Evaluation and Treatment Rehabilitation  PERTINENT HISTORY: Referral from MD for chronic right medial epicondylitis.    PRECAUTIONS: She has many common food allergies    WEIGHT BEARING RESTRICTIONS  WBAT  SUBJECTIVE:  She states feeling a bit flared up today and   for a couple days.   PAIN:  Are you having pain? Yes Rating: 1/10 at rest now, up to 3-4/10 with motion   OBJECTIVE: (All objective assessments below are from initial evaluation on: 02/21/22 unless otherwise specified.)   HAND DOMINANCE: Right    ADLs: Overall ADLs: States pumping a soap bottle, opening jars, opening doors, play sports, etc.    FUNCTIONAL OUTCOME MEASURES: Eval: Patient Specific Functional Scale: 4.6 (play sports, open jars, care for grandson)   UPPER EXTREMITY ROM      Active ROM Right eval Right 03/04/22  Elbow flexion 154 ("burns" @ lbow)    Elbow extension (-9) (b/l)   Wrist flexion 73 60  Wrist extension 64 (pain) 75  Wrist pronation 85   Wrist supination 79   (Blank rows = not tested)     UPPER EXTREMITY MMT:      MMT Right eval  Elbow flexion 4/5 tender  Elbow extension    Wrist flexion 3+/5 pain  Wrist extension 4+/5 tender  Wrist pronation 5/5  Wrist supination 5/5  (Blank rows = not tested)   HAND FUNCTION: 02/28/22: Grip strength Right: 40 lbs, Left: 54 lbs    COORDINATION: Eval: Box and Blocks Test: TBD Blocks today (TBD is WFL); 9 Hole Peg Test Right: TBDsec, Left: TBD sec (TBD sec is WFL)    SENSATION: Eval:  Light touch intact today, though states having diminished sensation in ulnar nerve distribution, especially at night.    EDEMA:             Eval: Very mildly swollen in wrist today   COGNITION: Overall cognitive status: WFL for evaluation today    OBSERVATIONS:             Eval: Right arm: she is mildly TTP to biceps tendon and some soreness with biceps resisted (but neg Speed's test), ECU area is not TTP but she states that is where she normally has pain in wrist. Slight pain resisted wrist ext, very painful resisted finger flexion, positive tinnel test at ulnar nerve @ elbow. Triceps also TTPs somewhat.      TODAY'S TREATMENT:  03/04/22: Due to increased shoulder spasm/pain/tenderness, OT does manual therapy MFR to bicep tendon, manual stretches to head of shoulder in retraction with pt in supine. She states this relieves some tension. OT also modifies HEP to "back down" from doorway stretches and bicep stretches to light scap b/l retraction isometrics for now, which are tolerated with no pain now. OT also reviews wrist stretches and does manually, also provides soft elbow strap with counter-force piece and explains how to wear to prevent pull on tender medial elbow. She states it works well and feels some relief at elbow now.  Otherwise she should keep on POC/HEP and go back to bigger stretches  at bicep when "flare up" is better.   02/28/22: OT reviews HEP while she is on [place] and then has her review and perform them with cues and direction to avoid shoulder pains during performance. She doesn't get pain now, but does get some increased numbness at elbow, so OT applies K-tape to help tension elbow in ext and relieve pressure at cubital tunnel, also reviews avoiding exacerbating positions, etc.   Exercises - Doorway Stretches (both arms low, lean in gently)   - 3-4 x daily - 3-5 reps - 15 hold - Standing Bicep Stretch at Wall  - 4 x daily - 3-5 reps - 15 sec hold - Tricep Stretch- DO SEATED BY   TABLE  - 3-4 x daily - 3-5 reps - 15 hold - Seated Wrist Flexion with Overpressure  - 3-4 x daily - 3-5 reps - 15 sec hold - Wrist Extension Stretch Pronated  - 3-4 x daily - 3-5 reps - 15 hold - Ulnar Nerve Flossing  - 3-4 x daily - 1-2 sets - 5-10 reps  Eval: Due to chronic nature, OT emphasizes rest from dynamic forceful activities now (weight lifting, sports multiple times a week, etc.) for at least 3-4 weeks.  She states understanding.  OT also edu on modalities, and HEP as below to begin. She demo's back with no added pain, states understanding.    Exercises - Doorway Stretches (both arms low, lean in gently)   - 3-4 x daily - 3-5 reps - 15 hold - Tricep Stretch- DO SEATED BY TABLE  - 3-4 x daily - 3-5 reps - 15 hold - Seated Wrist Flexion with Overpressure  - 3-4 x daily - 3-5 reps - 15 sec hold - Wrist Extension Stretch Pronated  - 3-4 x daily - 3-5 reps - 15 hold     PATIENT EDUCATION: Education details: See tx section above for details  Person educated: Patient Education method: Verbal Instruction, Teach back, Handouts  Education comprehension: States and demonstrates understanding, Additional Education required      HOME EXERCISE PROGRAM: Access Code: [email or web address] URL: [email or web address] Prepared by: Benito Mccreedy   GOALS: Goals reviewed with patient? Yes      SHORT TERM GOALS: (STG required if POC>30 days)   Pt will obtain protective, custom orthotic. Target date: TBD PRN Goal status: INITIAL   2.  Pt will demo/state understanding of initial HEP to improve pain levels and prerequisite motion. Target date: 03/11/22 Goal status: INITIAL     LONG TERM GOALS:   Pt will improve functional ability by decreased impairment per PSFS assessment from 4.6 to 8 or better, for better quality of life. Target date: 04/01/22 Goal status: INITIAL   2.  Pt will improve grip strength in right hand to at least 55lbs for functional use at home and in IADLs. Target date: 04/01/22 Goal status: INITIAL   3.  Pt will improve A/ROM in right wrist ext from painful 64* to at least non-painful 70*, to have functional motion for tasks like reach and grasp.  Target date: 04/01/22 Goal status: INITIAL   4.  Pt will improve strength in right wrist flexion from painful 3+/5 MMT to at least 4+/5 MMT to have increased functional ability to carry out selfcare and higher-level homecare tasks with no difficulty. Target date: 04/01/22 Goal status: INITIAL   5.  Pt will decrease pain at worst from 5/10 to 1-2/10 or better to have better sleep and occupational participation in daily roles. Target date: 04/01/22 Goal status: INITIAL     ASSESSMENT:   CLINICAL IMPRESSION: 03/04/22: She has significant bicep tendon spasm and pain today which OT helped. Wrist now no issue, but medial elbow still sore. She is encouraged to keep with plan "slow & steady" and give it more time, continue to avoid painful lifting/activities.   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  Wrist is doing much better and she states not recalling where it normally hurts.   Eval: Patient is a 61 y.o. female who was seen today for occupational therapy evaluation for right elbow pain and decreased functional activity.  PLAN: OT  FREQUENCY: 2x/week   OT DURATION: 6 weeks   PLANNED INTERVENTIONS: self care/ADL training, therapeutic exercise, therapeutic activity, neuromuscular re-education, manual therapy, passive range of motion, splinting, electrical stimulation, ultrasound, fluidotherapy, moist heat, cryotherapy, contrast bath, patient/family education, and coping strategies training   RECOMMENDED OTHER SERVICES: none now    CONSULTED AND AGREED WITH PLAN OF CARE: Patient   PLAN FOR NEXT SESSION:  Check pain levels and HEP, focus on pain relief, and get back into light biceps, triceps stretching as tolerated (for biceps tendon soreness). Progress at wrist/elbow to light isometric strength once non-painful medial epicondyle. Continue manual therapy.    Benito Mccreedy, OTR/L, CHT 03/04/2022, 1:35 PM

## 2022-03-07 ENCOUNTER — Ambulatory Visit (INDEPENDENT_AMBULATORY_CARE_PROVIDER_SITE_OTHER): Payer: No Typology Code available for payment source | Admitting: Rehabilitative and Restorative Service Providers"

## 2022-03-07 ENCOUNTER — Encounter: Payer: Self-pay | Admitting: Rehabilitative and Restorative Service Providers"

## 2022-03-07 ENCOUNTER — Ambulatory Visit (INDEPENDENT_AMBULATORY_CARE_PROVIDER_SITE_OTHER): Payer: No Typology Code available for payment source

## 2022-03-07 DIAGNOSIS — M25631 Stiffness of right wrist, not elsewhere classified: Secondary | ICD-10-CM

## 2022-03-07 DIAGNOSIS — R202 Paresthesia of skin: Secondary | ICD-10-CM

## 2022-03-07 DIAGNOSIS — J309 Allergic rhinitis, unspecified: Secondary | ICD-10-CM

## 2022-03-07 DIAGNOSIS — M25521 Pain in right elbow: Secondary | ICD-10-CM | POA: Diagnosis not present

## 2022-03-07 DIAGNOSIS — M25511 Pain in right shoulder: Secondary | ICD-10-CM | POA: Diagnosis not present

## 2022-03-07 NOTE — Therapy (Signed)
OUTPATIENT OCCUPATIONAL THERAPY TREATMENT NOTE   Patient Name: Yolanda [name] MRN: [id number] DOB:July 10, 1961, 61 y.o., female Today's Date: 03/07/2022  PCP: Wilfred Lacy, NP REFERRING PROVIDER: Dr. Sherilyn Cooter   END OF SESSION:   OT End of Session - 03/07/22 0936     Visit Number 4    Number of Visits 10    Date for OT Re-Evaluation 04/01/22    Authorization Type AETNA no limit    OT Start Time 0936    OT Stop Time 1027    OT Time Calculation (min) 51 min    Equipment Utilized During Treatment --    Activity Tolerance Patient tolerated treatment well;Patient limited by pain;Patient limited by fatigue    Behavior During Therapy [facility] for tasks assessed/performed               Past Medical History:  Diagnosis Date   Acute appendicitis 09/14/2018   Allergy    Anemia    Anxiety    Asthma    BRCA negative 12/2014   Depression    Endometriosis    Esophagitis    Fibroid    GERD (gastroesophageal reflux disease)    Hiatal hernia    History of diverticulitis    IC (interstitial cystitis)    Osteoporosis 01/2019   2018 T score -2.6, 2020 T score -2.0 on Actonel   Schatzki's ring    Past Surgical History:  Procedure Laterality Date   CHOLECYSTECTOMY     HYSTEROSCOPY  12/2003   RESECTON OF SUBMUCOUS  MYOMA/DX SCOPE   LAPAROSCOPIC APPENDECTOMY N/A 09/14/2018   Procedure: APPENDECTOMY LAPAROSCOPIC;  Surgeon: Ileana Roup, MD;  Location: [facility];  Service: General;  Laterality: N/A;   LAPAROSCOPIC BILATERAL SALPINGO OOPHERECTOMY Bilateral 07/02/2014   Procedure: LAPAROSCOPIC BILATERAL SALPINGO OOPHORECTOMY;  Surgeon: Anastasio Auerbach, MD;  Location: [facility];  Service: Gynecology;  Laterality: Bilateral;   PELVIC LAPAROSCOPY  12/2003   DX SCOPE/RESECTION OF SUBMUCOUS MYOMA   TMJ ARTHROSCOPY  11-26-2001   VAGINAL HYSTERECTOMY  12/05   Menorrhagia/dysmenorrhea. Pathology showed leiomyoma/adenomyosis   Patient Active Problem List   Diagnosis Date Noted    Chronic pain of right elbow 10/22/2021   Medial epicondylitis of elbow, left 06/15/2020   Renal cyst, right 09/19/2019   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/18/2019   Lumbar radiculopathy 09/09/2019   Low back pain 05/27/2019   Closed fracture of coccyx ([place]) 12/04/2018   Pain in the coccyx 12/04/2018   Well woman exam without gynecological exam 03/20/2018   Food allergy 03/20/2018   SOB (shortness of breath) on exertion 03/20/2018   Genetic testing 12/23/2014   Family history of breast cancer    Family history of uterine cancer    Osteoporosis 10/04/2014   Pancreatic duct dilated 05/10/2012   IBS 08/01/2008   UNS ADVRS EFF UNS RX MEDICINAL&BIOLOGICAL SBSTNC 01/21/2008   Depression, major, single episode, complete remission ([place]) 01/18/2008   RHINITIS, CHRONIC 01/18/2008    ONSET DATE: Chronic (over 1.5 years)    REFERRING DIAG: M25.521,G89.29 (ICD-10-CM) - Chronic pain of right elbow  THERAPY DIAG:  Stiffness of right wrist, not elsewhere classified  Paresthesia of skin  Pain in right elbow  Acute pain of right shoulder  Rationale for Evaluation and Treatment Rehabilitation  PERTINENT HISTORY: Referral from MD for chronic right medial epicondylitis.    PRECAUTIONS: She has many common food allergies    WEIGHT BEARING RESTRICTIONS  WBAT  SUBJECTIVE:  She states shoulder is better after modifying HEP,  but elbow now feels more sore after taking care of her grand all weekend.    PAIN:  Are you having pain? Yes  Rating: 1-2/10 at rest now, up to 3-4/10 with motion   OBJECTIVE: (All objective assessments below are from initial evaluation on: 02/21/22 unless otherwise specified.)   HAND DOMINANCE: Right    ADLs: Overall ADLs: States pumping a soap bottle, opening jars, opening doors, play sports, etc.    FUNCTIONAL OUTCOME MEASURES: Eval: Patient Specific Functional Scale: 4.6 (play sports, open jars, care for grandson)   UPPER EXTREMITY ROM      Active ROM  Right eval Right 03/04/22  Elbow flexion 154 ("burns" @ lbow)   Elbow extension (-9) (b/l)   Wrist flexion 73 60  Wrist extension 64 (pain) 75  Wrist pronation 85   Wrist supination 79   (Blank rows = not tested)     UPPER EXTREMITY MMT:      MMT Right eval  Elbow flexion 4/5 tender  Elbow extension    Wrist flexion 3+/5 pain  Wrist extension 4+/5 tender  Wrist pronation 5/5  Wrist supination 5/5  (Blank rows = not tested)   HAND FUNCTION: 02/28/22: Grip strength Right: 40 lbs, Left: 54 lbs    COORDINATION: Eval: Box and Blocks Test: TBD Blocks today (TBD is [facility]); 9 Hole Peg Test Right: TBDsec, Left: TBD sec (TBD sec is WFL)    SENSATION: Eval:  Light touch intact today, though states having diminished sensation in ulnar nerve distribution, especially at night.    EDEMA:             Eval: Very mildly swollen in wrist today   COGNITION: Overall cognitive status: WFL for evaluation today    OBSERVATIONS:             Eval: Right arm: she is mildly TTP to biceps tendon and some soreness with biceps resisted (but neg Speed's test), ECU area is not TTP but she states that is where she normally has pain in wrist. Slight pain resisted wrist ext, very painful resisted finger flexion, positive tinnel test at ulnar nerve @ elbow. Triceps also TTPs somewhat.      TODAY'S TREATMENT:  03/07/22: OT reviews recommendations for monitoring postures and positions (cautions about supinated elbow extended positions and slumped at shoulder) while she is on MH 5 mins at elbow and shoulder. OT does manual GH J posterior glides, IASTM and MFR to right biceps tending gently (she states it feeling better, good now). OT also adds several new stretches and modifications to stretches with emphasis on pt education to no "over-do" and to progress back to more aggressive stretches when able. OT also gives new isometric shoulder strength, weighted stretches at wrist and elbow with hammer or light hand weights  as listed below. She states to gently get into these over next 2-3 weeks.   Exercises - Standing Shoulder Internal Rotation Stretch with Hands Behind Back  - 3-4 x daily - 3-5 reps - 15 hold - Doorway Stretches (both arms low, lean in gently)   - 3-4 x daily - 3-5 reps - 15 hold - Standing Bicep Stretch at Wall  - 4 x daily - 3-5 reps - 15 sec hold - Tricep Stretch- DO SEATED BY TABLE  - 3-4 x daily - 3-5 reps - 15 hold - Seated Wrist Flexion with Overpressure  - 3-4 x daily - 3-5 reps - 15 sec hold - Wrist Extension Stretch Pronated  - 3-4  x daily - 3-5 reps - 15 hold - Ulnar Nerve Flossing  - 3-4 x daily - 1-2 sets - 5-10 reps - ECCENTRIC HAMMER (SLOWLY LET DOWN)   - 2-3 x daily - 1-2 sets - 5-15 reps - Standing Single Arm Bicep Curls Supinated with Dumbbell  - 4-6 x daily - 1 sets - 10-15 reps - Isometric Shoulder Flexion at Wall  - 1-3 x daily - 1 sets - 5-15 reps - 3 sec hold  03/04/22: Due to increased shoulder spasm/pain/tenderness, OT does manual therapy MFR to bicep tendon, manual stretches to head of shoulder in retraction with pt in supine. She states this relieves some tension. OT also modifies HEP to "back down" from doorway stretches and bicep stretches to light scap b/l retraction isometrics for now, which are tolerated with no pain now. OT also reviews wrist stretches and does manually, also provides soft elbow strap with counter-force piece and explains how to wear to prevent pull on tender medial elbow. She states it works well and feels some relief at elbow now.  Otherwise she should keep on POC/HEP and go back to bigger stretches at bicep when "flare up" is better.   02/28/22: OT reviews HEP while she is on [place] and then has her review and perform them with cues and direction to avoid shoulder pains during performance. She doesn't get pain now, but does get some increased numbness at elbow, so OT applies K-tape to help tension elbow in ext and relieve pressure at cubital tunnel, also  reviews avoiding exacerbating positions, etc.   Exercises - Doorway Stretches (both arms low, lean in gently)   - 3-4 x daily - 3-5 reps - 15 hold - Standing Bicep Stretch at Wall  - 4 x daily - 3-5 reps - 15 sec hold - Tricep Stretch- DO SEATED BY TABLE  - 3-4 x daily - 3-5 reps - 15 hold - Seated Wrist Flexion with Overpressure  - 3-4 x daily - 3-5 reps - 15 sec hold - Wrist Extension Stretch Pronated  - 3-4 x daily - 3-5 reps - 15 hold - Ulnar Nerve Flossing  - 3-4 x daily - 1-2 sets - 5-10 reps  Eval: Due to chronic nature, OT emphasizes rest from dynamic forceful activities now (weight lifting, sports multiple times a week, etc.) for at least 3-4 weeks.  She states understanding.  OT also edu on modalities, and HEP as below to begin. She demo's back with no added pain, states understanding.    Exercises - Doorway Stretches (both arms low, lean in gently)   - 3-4 x daily - 3-5 reps - 15 hold - Tricep Stretch- DO SEATED BY TABLE  - 3-4 x daily - 3-5 reps - 15 hold - Seated Wrist Flexion with Overpressure  - 3-4 x daily - 3-5 reps - 15 sec hold - Wrist Extension Stretch Pronated  - 3-4 x daily - 3-5 reps - 15 hold     PATIENT EDUCATION: Education details: See tx section above for details  Person educated: Patient Education method: Verbal Instruction, Teach back, Handouts  Education comprehension: States and demonstrates understanding, Additional Education required      HOME EXERCISE PROGRAM: Access Code: [email or web address] URL: [email or web address] Prepared by: Benito Mccreedy   GOALS: Goals reviewed with patient? Yes     SHORT TERM GOALS: (STG required if POC>30 days)   Pt will obtain protective, custom orthotic. Target date: TBD PRN Goal status: INITIAL   2.  Pt will demo/state  understanding of initial HEP to improve pain levels and prerequisite motion. Target date: 03/11/22 Goal status: INITIAL     LONG TERM GOALS:   Pt will improve functional ability by  decreased impairment per PSFS assessment from 4.6 to 8 or better, for better quality of life. Target date: 04/01/22 Goal status: INITIAL   2.  Pt will improve grip strength in right hand to at least 55lbs for functional use at home and in IADLs. Target date: 04/01/22 Goal status: INITIAL   3.  Pt will improve A/ROM in right wrist ext from painful 64* to at least non-painful 70*, to have functional motion for tasks like reach and grasp.  Target date: 04/01/22 Goal status: INITIAL   4.  Pt will improve strength in right wrist flexion from painful 3+/5 MMT to at least 4+/5 MMT to have increased functional ability to carry out selfcare and higher-level homecare tasks with no difficulty. Target date: 04/01/22 Goal status: INITIAL   5.  Pt will decrease pain at worst from 5/10 to 1-2/10 or better to have better sleep and occupational participation in daily roles. Target date: 04/01/22 Goal status: INITIAL     ASSESSMENT:   CLINICAL IMPRESSION: 03/07/22: Shoulder is doing better, elbow tightness and numbness in ulnar nerve still an issues but she hasn't been dealing with night postures well yet and caring for grandchild has been exacerbating issues at times. Needs more time, gentle progression.   03/04/22: She has significant bicep tendon spasm and pain today which OT helped. Wrist now no issue, but medial elbow still sore. She is encouraged to keep with plan "slow & steady" and give it more time, continue to avoid painful lifting/activities.     PLAN: OT FREQUENCY: 2x/week   OT DURATION: 6 weeks   PLANNED INTERVENTIONS: self care/ADL training, therapeutic exercise, therapeutic activity, neuromuscular re-education, manual therapy, passive range of motion, splinting, electrical stimulation, ultrasound, fluidotherapy, moist heat, cryotherapy, contrast bath, patient/family education, and coping strategies training   RECOMMENDED OTHER SERVICES: none now    CONSULTED AND AGREED WITH PLAN OF CARE:  Patient   PLAN FOR NEXT SESSION:  Check progress, strength, see how far she was able to progress after 2-3 weeks self-management.  Continue manual as needed, check goals, etc.    Benito Mccreedy, OTR/L, CHT 03/07/2022, 11:39 AM

## 2022-03-14 ENCOUNTER — Ambulatory Visit (INDEPENDENT_AMBULATORY_CARE_PROVIDER_SITE_OTHER): Payer: No Typology Code available for payment source

## 2022-03-14 DIAGNOSIS — J309 Allergic rhinitis, unspecified: Secondary | ICD-10-CM | POA: Diagnosis not present

## 2022-03-17 ENCOUNTER — Ambulatory Visit: Payer: No Typology Code available for payment source | Admitting: Orthopedic Surgery

## 2022-03-21 ENCOUNTER — Ambulatory Visit (INDEPENDENT_AMBULATORY_CARE_PROVIDER_SITE_OTHER): Payer: No Typology Code available for payment source

## 2022-03-21 DIAGNOSIS — J309 Allergic rhinitis, unspecified: Secondary | ICD-10-CM

## 2022-03-28 ENCOUNTER — Encounter: Payer: No Typology Code available for payment source | Admitting: Rehabilitative and Restorative Service Providers"

## 2022-04-04 ENCOUNTER — Encounter: Payer: Self-pay | Admitting: Rehabilitative and Restorative Service Providers"

## 2022-04-04 ENCOUNTER — Ambulatory Visit (INDEPENDENT_AMBULATORY_CARE_PROVIDER_SITE_OTHER): Payer: No Typology Code available for payment source | Admitting: Rehabilitative and Restorative Service Providers"

## 2022-04-04 ENCOUNTER — Ambulatory Visit (INDEPENDENT_AMBULATORY_CARE_PROVIDER_SITE_OTHER): Payer: No Typology Code available for payment source | Admitting: Orthopedic Surgery

## 2022-04-04 DIAGNOSIS — R200 Anesthesia of skin: Secondary | ICD-10-CM

## 2022-04-04 DIAGNOSIS — R202 Paresthesia of skin: Secondary | ICD-10-CM

## 2022-04-04 DIAGNOSIS — M25511 Pain in right shoulder: Secondary | ICD-10-CM

## 2022-04-04 DIAGNOSIS — M25521 Pain in right elbow: Secondary | ICD-10-CM

## 2022-04-04 DIAGNOSIS — M25631 Stiffness of right wrist, not elsewhere classified: Secondary | ICD-10-CM

## 2022-04-04 DIAGNOSIS — M7702 Medial epicondylitis, left elbow: Secondary | ICD-10-CM

## 2022-04-04 HISTORY — DX: Anesthesia of skin: R20.0

## 2022-04-04 NOTE — Therapy (Signed)
OUTPATIENT OCCUPATIONAL THERAPY TREATMENT & Discharge NOTE   Patient Name: Yolanda [name] MRN: [id number] DOB:[birth year], 61 y.o., female Today's Date: 04/04/2022  PCP: Wilfred Lacy, NP REFERRING PROVIDER: Dr. Sherilyn Cooter   Progress Note  Reporting Period 02/21/22 to 04/04/22  See note below for Objective Data and Assessment of Progress/Goals.      END OF SESSION:   OT End of Session - 04/04/22 0928     Visit Number 5    Number of Visits 10    Date for OT Re-Evaluation 04/01/22    Authorization Type AETNA no limit    OT Start Time 0929    OT Stop Time 1026    OT Time Calculation (min) 57 min    Activity Tolerance Patient tolerated treatment well;Patient limited by pain;Patient limited by fatigue    Behavior During Therapy [facility] for tasks assessed/performed                Past Medical History:  Diagnosis Date   Acute appendicitis 09/14/2018   Allergy    Anemia    Anxiety    Asthma    BRCA negative 12/2014   Depression    Endometriosis    Esophagitis    Fibroid    GERD (gastroesophageal reflux disease)    Hiatal hernia    History of diverticulitis    IC (interstitial cystitis)    Osteoporosis 01/2019   2018 T score -2.6, 2020 T score -2.0 on Actonel   Schatzki's ring    Past Surgical History:  Procedure Laterality Date   CHOLECYSTECTOMY     HYSTEROSCOPY  12/2003   RESECTON OF SUBMUCOUS  MYOMA/DX SCOPE   LAPAROSCOPIC APPENDECTOMY N/A 09/14/2018   Procedure: APPENDECTOMY LAPAROSCOPIC;  Surgeon: Ileana Roup, MD;  Location: [facility];  Service: General;  Laterality: N/A;   LAPAROSCOPIC BILATERAL SALPINGO OOPHERECTOMY Bilateral 07/02/2014   Procedure: LAPAROSCOPIC BILATERAL SALPINGO OOPHORECTOMY;  Surgeon: Anastasio Auerbach, MD;  Location: [facility];  Service: Gynecology;  Laterality: Bilateral;   PELVIC LAPAROSCOPY  12/2003   DX SCOPE/RESECTION OF SUBMUCOUS MYOMA   TMJ ARTHROSCOPY  11-26-2001   VAGINAL HYSTERECTOMY  12/05    Menorrhagia/dysmenorrhea. Pathology showed leiomyoma/adenomyosis   Patient Active Problem List   Diagnosis Date Noted   Chronic pain of right elbow 10/22/2021   Medial epicondylitis of elbow, left 06/15/2020   Renal cyst, right 09/19/2019   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/18/2019   Lumbar radiculopathy 09/09/2019   Low back pain 05/27/2019   Closed fracture of coccyx ([place]) 12/04/2018   Pain in the coccyx 12/04/2018   Well woman exam without gynecological exam 03/20/2018   Food allergy 03/20/2018   SOB (shortness of breath) on exertion 03/20/2018   Genetic testing 12/23/2014   Family history of breast cancer    Family history of uterine cancer    Osteoporosis 10/04/2014   Pancreatic duct dilated 05/10/2012   IBS 08/01/2008   UNS ADVRS EFF UNS RX MEDICINAL&BIOLOGICAL SBSTNC 01/21/2008   Depression, major, single episode, complete remission ([place]) 01/18/2008   RHINITIS, CHRONIC 01/18/2008    ONSET DATE: Chronic (over 1.5 years)    REFERRING DIAG: M25.521,G89.29 (ICD-10-CM) - Chronic pain of right elbow  THERAPY DIAG:  Stiffness of right wrist, not elsewhere classified  Pain in right elbow  Acute pain of right shoulder  Paresthesia of skin  Rationale for Evaluation and Treatment Rehabilitation  PERTINENT HISTORY: Referral from MD for chronic right medial epicondylitis.    PRECAUTIONS: She has many common food allergies  WEIGHT BEARING RESTRICTIONS  WBAT   SUBJECTIVE:  She states feeling about the same at her right medial elbow, but no issues at right shoulder or wrist now.     PAIN:  Are you having pain? Yes  Rating: 2-3/10 at rest now in medial elbow    OBJECTIVE: (All objective assessments below are from initial evaluation on: 02/21/22 unless otherwise specified.)   HAND DOMINANCE: Right    ADLs: Overall ADLs: States pumping a soap bottle, opening jars, opening doors, play sports, etc.    FUNCTIONAL OUTCOME MEASURES: 04/04/22: PSFS 5 today  Eval:  Patient Specific Functional Scale: 4.6 (play sports, open jars, care for grandson)   UPPER EXTREMITY ROM     8/14: Right sh ER: 82, IR: 62 painful at elbow.    Active ROM Right eval Right 03/04/22 Right 04/04/22  Elbow flexion 154 ("burns" @ lbow)  Equal b/l but makes R elbow tingle   Elbow extension (-9) (b/l)  Equal b/l   Wrist flexion 73 60 71  Wrist extension 64 (pain) 75 78  Wrist pronation 85  91  Wrist supination 79  81  (Blank rows = not tested)     UPPER EXTREMITY MMT:      MMT Right eval Right 04/04/22  Elbow flexion 4/5 tender   Elbow extension     Wrist flexion 3+/5 pain 4+/5  Wrist extension 4+/5 tender 4+/5  Wrist pronation 5/5   Wrist supination 5/5   (Blank rows = not tested)   HAND FUNCTION: 04/04/22: 54# b/l no without pain  02/28/22: Grip strength Right: 40 lbs, Left: 54 lbs    COORDINATION: 04/04/22: no significant issues    SENSATION: 04/04/22: still c/o ulnar nerve tingling   Eval:  Light touch intact today, though states having diminished sensation in ulnar nerve distribution, especially at night.     OBSERVATIONS:           04/04/22: still overt ulnar nerve TTP and tingling in distribution today, but shoulder now NOT TTP as well as right wrist NOT TTP now. Improvements noted     Eval: Right arm: she is mildly TTP to biceps tendon and some soreness with biceps resisted (but neg Speed's test), ECU area is not TTP but she states that is where she normally has pain in wrist. Slight pain resisted wrist ext, very painful resisted finger flexion, positive tinnel test at ulnar nerve @ elbow. Triceps also TTPs somewhat.      TODAY'S TREATMENT:  04/04/22:  Pt performs AROM, gripping, and strength with right shoulder, wrist, elbow, forearm against resistance for exercise/activities as well as new measures today. Using that data, OT also reviews home exercises and provides appropriate upgrades/recommendations as below. OT also discusses home and functional  tasks with the pt and reviews goals. Pt states understanding and tolerates upgrades well in session.   Final recommendations:  For shoulder: stretch first (think "end range motion") in extension (doorway, etc.), flexion (wall climbs), internal rotation (sleeper stretches); then carefully, progressively strengthen external rotation, flexion, side arm raises, rows   For wrist:  keep on with wrist stretches in flexion, extension & strength (however you like) in wrist flexion, extension, and also biceps and triceps, etc.   For elbow:  I feel this is TIME and HABIT based.  Be careful: no prolonged bent elbow, no resting on elbow or pushing up in bed on elbows, watch vibrations, continue ulnar nerve glides, definitely do palm up stretches (hammer, holding around wrist) and supinator strength (  move from palm down to thumb up while hold hammer, etc.). Try massage around tight pronator (inner elbow area). Also try grip training 2-3 x day, 5x.      PATIENT EDUCATION: Education details: See tx section above for details  Person educated: Patient Education method: Verbal Instruction, Teach back, Handouts  Education comprehension: States and demonstrates understanding, Additional Education required      HOME EXERCISE PROGRAM: Access Code: [email or web address] URL: [email or web address] Prepared by: Benito Mccreedy   GOALS: Goals reviewed with patient? Yes     SHORT TERM GOALS: (STG required if POC>30 days)   Pt will obtain protective, custom orthotic. Target date: TBD PRN Goal status: 04/04/22: D/C    2.  Pt will demo/state understanding of initial HEP to improve pain levels and prerequisite motion. Target date: 03/11/22 Goal status: 04/04/22: MET     LONG TERM GOALS:   Pt will improve functional ability by decreased impairment per PSFS assessment from 4.6 to 8 or better, for better quality of life. Target date: 04/01/22 Goal status: 04/04/22: D/C not met- rated 5 now    2.  Pt will  improve grip strength in right hand to at least 55lbs for functional use at home and in IADLs. Target date: 04/01/22 Goal status: 04/04/22: GOAL MET (54# b/l today but considered met)    3.  Pt will improve A/ROM in right wrist ext from painful 64* to at least non-painful 70*, to have functional motion for tasks like reach and grasp.  Target date: 04/01/22 Goal status: 04/04/22: GOAL MET   4.  Pt will improve strength in right wrist flexion from painful 3+/5 MMT to at least 4+/5 MMT to have increased functional ability to carry out selfcare and higher-level homecare tasks with no difficulty. Target date: 04/01/22 Goal status: 04/04/22: GOAL MET   5.  Pt will decrease pain at worst from 5/10 to 1-2/10 or better to have better sleep and occupational participation in daily roles. Target date: 04/01/22 Goal status: 04/04/22: D/C  not met     ASSESSMENT:   CLINICAL IMPRESSION: 04/04/22: Her right wrist pain has gotten better and she is more mobile, stronger. Her right shoulder pain is also gone, she is more flexible now and has HEP for stretches and strength.   Her right elbow / ulnar nerve pain is not better and she is still TTP at ulnar nerve c/o tingling which is worse with strong pronation and bent elbow positions. She does have recommendations for this as well, and exercises, but it seems to be taking a long time to to chronic nature.  She was recommended to consider injection or surgery for decompression, if she feels like she can't stand the long road of conservative management. She states understanding and will discuss with MD.     PLAN: OT FREQUENCY: 1 additional visit (to cover today's last visit)    OT DURATION: from 04/01/22 to 04/04/22 (to cover today's last visit)    PLANNED INTERVENTIONS: self care/ADL training, therapeutic exercise, therapeutic activity, neuromuscular re-education, manual therapy, passive range of motion, splinting, electrical stimulation, ultrasound, fluidotherapy,  moist heat, cryotherapy, contrast bath, patient/family education, and coping strategies training   RECOMMENDED OTHER SERVICES: none now    CONSULTED AND AGREED WITH PLAN OF CARE: Patient   PLAN FOR NEXT SESSION:  D/C today for max progress met and pt wishing to self-manage now.   Benito Mccreedy, OTR/L, CHT 04/04/2022, 10:39 AM  OCCUPATIONAL THERAPY DISCHARGE SUMMARY  Visits from Start of Care: 5  Current functional level related to goals / functional outcomes: Pt has met ~50 % of goals and maximized progress  Remaining deficits: Pt has no more significant functional deficits or pain in shoulder or wrist but continues to have nerve pain in right medial elbow.   Education / Equipment: Pt has all needed materials and education. Pt understands how to continue on with self-management. See tx notes for more details.   Patient agrees to discharge due to max benefits received from outpatient occupational therapy / hand therapy at this time.   Benito Mccreedy, OTR/L, CHT 04/04/22

## 2022-04-04 NOTE — Progress Notes (Signed)
Office Visit Note   Patient: Yolanda [name]           Date of Birth: Jan 26, 1961           MRN: [id number] Visit Date: 04/04/2022              Requested by: Flossie Buffy, NP [place],  [place] [address] PCP: Flossie Buffy, NP   Assessment & Plan: Visit Diagnoses:  1. Medial epicondylitis of elbow, left   2. Numbness and tingling in right hand     Plan: Patient has continued numbness and paresthesias intermittently in the right ring and small finger and continued pain in the mid aspect of the elbow despite occupational therapy.  Her shoulder and wrist pain have improved with therapy but her medial elbow pain remains the same.  I like to get EMG/nerve conduction study to evaluate for potential cubital tunnel syndrome.  She will call me once the study is done we will discuss results.  Follow-Up Instructions: No follow-ups on file.   Orders:  No orders of the defined types were placed in this encounter.  No orders of the defined types were placed in this encounter.     Procedures: No procedures performed   Clinical Data: No additional findings.   Subjective: Chief Complaint  Patient presents with   Right Elbow - Follow-up    This is a 61 year old right-hand-dominant female presents for follow-up of a right medial elbow pain.  She has been in occupational therapy since her last visit at the end of June.  She notes that her right shoulder and right wrist pain have resolved but her medial elbow pain is largely unchanged.  She still has numbness and paresthesias in the ring and small finger that occur intermittently.  She been sleeping in a brace with minimal to no symptom relief.    Review of Systems   Objective: Vital Signs: There were no vitals taken for this visit.  Physical Exam Constitutional:      Appearance: Normal appearance.  Cardiovascular:     Rate and Rhythm: Normal rate.     Pulses: Normal pulses.  Pulmonary:      Effort: Pulmonary effort is normal.  Skin:    General: Skin is warm and dry.     Capillary Refill: Capillary refill takes less than 2 seconds.  Neurological:     Mental Status: She is alert.     Right Elbow Exam   Tenderness  The patient is experiencing tenderness in the medial epicondyle.   Range of Motion  The patient has normal right elbow ROM.  Tests  Tinel's sign (cubital tunnel): positive  Other  Erythema: absent Sensation: normal Pulse: present  Comments:  Palpable ulnar nerve instability today which was not appreciable at her previous visit.       Specialty Comments:  No specialty comments available.  Imaging: No results found.   PMFS History: Patient Active Problem List   Diagnosis Date Noted   Numbness and tingling in right hand 04/04/2022   Chronic pain of right elbow 10/22/2021   Medial epicondylitis of elbow, left 06/15/2020   Renal cyst, right 09/19/2019   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/18/2019   Lumbar radiculopathy 09/09/2019   Low back pain 05/27/2019   Closed fracture of coccyx ([place]) 12/04/2018   Pain in the coccyx 12/04/2018   Well woman exam without gynecological exam 03/20/2018   Food allergy 03/20/2018   SOB (shortness of breath) on  exertion 03/20/2018   Genetic testing 12/23/2014   Family history of breast cancer    Family history of uterine cancer    Osteoporosis 10/04/2014   Pancreatic duct dilated 05/10/2012   IBS 08/01/2008   UNS ADVRS EFF UNS RX MEDICINAL&BIOLOGICAL SBSTNC 01/21/2008   Depression, major, single episode, complete remission ([place]) 01/18/2008   RHINITIS, CHRONIC 01/18/2008   Past Medical History:  Diagnosis Date   Acute appendicitis 09/14/2018   Allergy    Anemia    Anxiety    Asthma    BRCA negative 12/2014   Depression    Endometriosis    Esophagitis    Fibroid    GERD (gastroesophageal reflux disease)    Hiatal hernia    History of diverticulitis    IC (interstitial cystitis)     Osteoporosis 01/2019   2018 T score -2.6, 2020 T score -2.0 on Actonel   Schatzki's ring     Family History  Problem Relation Age of Onset   Uterine cancer Maternal Grandmother        dx in her 19s   Breast cancer Sister        paternal half sister dx in her early 62s   Cystic fibrosis Other        brother's granddauther   Emphysema Father    Early death Father    Breast cancer Sister        paternal half sister dx in her late 40s   Cystic fibrosis Other        brothers granddaughters   Depression Mother    Hyperlipidemia Mother    Hypertension Mother    Mental illness Mother    65 / [place] Mother    Diabetes Brother 66       HALF BROTHER/HALF BROTHER    Breast cancer Sister    GI problems Brother    Colon cancer Neg Hx    Esophageal cancer Neg Hx    Rectal cancer Neg Hx    Stomach cancer Neg Hx    Allergic rhinitis Neg Hx    Asthma Neg Hx    Eczema Neg Hx    Urticaria Neg Hx     Past Surgical History:  Procedure Laterality Date   CHOLECYSTECTOMY     HYSTEROSCOPY  12/2003   RESECTON OF SUBMUCOUS  MYOMA/DX SCOPE   LAPAROSCOPIC APPENDECTOMY N/A 09/14/2018   Procedure: APPENDECTOMY LAPAROSCOPIC;  Surgeon: Ileana Roup, MD;  Location: [facility];  Service: General;  Laterality: N/A;   LAPAROSCOPIC BILATERAL SALPINGO OOPHERECTOMY Bilateral 07/02/2014   Procedure: LAPAROSCOPIC BILATERAL SALPINGO OOPHORECTOMY;  Surgeon: Anastasio Auerbach, MD;  Location: [facility];  Service: Gynecology;  Laterality: Bilateral;   PELVIC LAPAROSCOPY  12/2003   DX SCOPE/RESECTION OF SUBMUCOUS MYOMA   TMJ ARTHROSCOPY  11-26-2001   VAGINAL HYSTERECTOMY  12/05   Menorrhagia/dysmenorrhea. Pathology showed leiomyoma/adenomyosis   Social History   Occupational History   Not on file  Tobacco Use   Smoking status: Never   Smokeless tobacco: Never  Vaping Use   Vaping Use: Never used  Substance and Sexual Activity   Alcohol use: Yes    Alcohol/week: 2.0 standard drinks of  alcohol    Types: 2 Glasses of wine per week    Comment: week   Drug use: No   Sexual activity: Yes    Birth control/protection: Surgical    Comment: HYST-1st intercourse 61 yo-Fewer than 5 partners

## 2022-04-15 ENCOUNTER — Encounter: Payer: Self-pay | Admitting: Physical Medicine and Rehabilitation

## 2022-04-15 ENCOUNTER — Ambulatory Visit (INDEPENDENT_AMBULATORY_CARE_PROVIDER_SITE_OTHER): Payer: No Typology Code available for payment source | Admitting: Physical Medicine and Rehabilitation

## 2022-04-15 DIAGNOSIS — R202 Paresthesia of skin: Secondary | ICD-10-CM | POA: Diagnosis not present

## 2022-04-15 NOTE — Progress Notes (Unsigned)
   Yolanda [name] - 61 y.o. female MRN [id number]  Date of birth: 20-Jan-1961  Office Visit Note: Visit Date: 04/15/2022 PCP: Flossie Buffy, NP Referred by: Sherilyn Cooter, MD  Subjective: No chief complaint on file.  HPI:  Yolanda [name] is a 61 y.o. female who comes in today at the request of Dr. Sherilyn Cooter for electrodiagnostic study of the Right upper extremities.  Patient is Right hand dominant.  a right medial elbow pain.  She has been in occupational therapy since her last visit at the end of June.  She notes that her right shoulder and right wrist pain have resolved but her medial elbow pain is largely unchanged.  She still has numbness and paresthesias in the ring and small finger that occur intermittently.  She been sleeping in a brace with minimal to no symptom relief    ROS Otherwise per HPI.  Assessment & Plan: Visit Diagnoses:    ICD-10-CM   1. Paresthesia of skin  R20.2 NCV with EMG (electromyography)      Plan: No additional findings.   Meds & Orders: No orders of the defined types were placed in this encounter.   Orders Placed This Encounter  Procedures   NCV with EMG (electromyography)    Follow-up: No follow-ups on file.   Procedures: No procedures performed      Clinical History: No specialty comments available.     Objective:  VS:  HT:    WT:   BMI:     BP:   HR: bpm  TEMP: ( )  RESP:  Physical Exam Musculoskeletal:        General: No swelling, tenderness or deformity.     Comments: Inspection reveals no atrophy of the bilateral APB or FDI or hand intrinsics. There is no swelling, color changes, allodynia or dystrophic changes. There is 5 out of 5 strength in the bilateral wrist extension, finger abduction and long finger flexion. There is intact sensation to light touch in all dermatomal and peripheral nerve distributions. There is a negative Froment's test bilaterally. There is a negative Tinel's test at the bilateral wrist and  elbow. There is a negative Phalen's test bilaterally. There is a negative Hoffmann's test bilaterally.  Skin:    General: Skin is warm and dry.     Findings: No erythema or rash.  Neurological:     General: No focal deficit present.     Mental Status: She is alert and oriented to person, place, and time.     Motor: No weakness or abnormal muscle tone.     Coordination: Coordination normal.  Psychiatric:        Mood and Affect: Mood normal.        Behavior: Behavior normal.      Imaging: No results found.

## 2022-04-15 NOTE — Progress Notes (Unsigned)
Pt state right wrist, elbow and shoulder pain and numbness. Pt state when playing tennis she can feel a lot of pain. Pt state she takes over the counter pain meds and pain cream. Pt state she wears a brace at night tho help with the pain.  Numeric Pain Rating Scale and Functional Assessment Average Pain 3   In the last MONTH (on 0-10 scale) has pain interfered with the following?  1. General activity like being  able to carry out your everyday physical activities such as walking, climbing stairs, carrying groceries, or moving a chair?  Rating(7)    -BT,

## 2022-04-18 ENCOUNTER — Ambulatory Visit (INDEPENDENT_AMBULATORY_CARE_PROVIDER_SITE_OTHER): Payer: No Typology Code available for payment source

## 2022-04-18 DIAGNOSIS — J309 Allergic rhinitis, unspecified: Secondary | ICD-10-CM | POA: Diagnosis not present

## 2022-04-18 NOTE — Procedures (Signed)
EMG & NCV Findings: Evaluation of the right ulnar motor nerve showed decreased conduction velocity (B Elbow-Wrist, 50 m/s).  The right median (across palm) sensory nerve showed prolonged distal peak latency (Palm, 2.4 ms).  The right ulnar sensory nerve showed prolonged distal peak latency (4.2 ms) and decreased conduction velocity (Wrist-5th Digit, 33 m/s).  All remaining nerves (as indicated in the following tables) were within normal limits.    All examined muscles (as indicated in the following table) showed no evidence of electrical instability.    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 There is no significant electrodiagnostic evidence of any other focal nerve entrapment, brachial plexopathy or cervical radiculopathy.   Recommendations: 1.  Follow-up with referring physician. 2.  Continue current management of symptoms.  ___________________________ Laurence Spates FAAPMR Board Certified, American Board of Physical Medicine and Rehabilitation    Nerve Conduction Studies Anti Sensory Summary Table   Stim Site NR Peak (ms) Norm Peak (ms) P-T Amp (V) Norm P-T Amp Site1 Site2 Delta-P (ms) Dist (cm) Vel (m/s) Norm Vel (m/s)  Right Median Acr Palm Anti Sensory (2nd Digit)  30.1C  Wrist    3.8 <3.8 50.1 >10 Wrist Palm 1.4 0.0    Palm    *2.4 <2.0 48.8         Right Radial Anti Sensory (Base 1st Digit)  29.7C  Wrist    2.6 <3.1 30.7  Wrist Base 1st Digit 2.6 0.0    Right Ulnar Anti Sensory (5th Digit)  30.1C  Wrist    *4.2 <3.7 30.0 >15.0 Wrist 5th Digit 4.2 14.0 *33 >38   Motor Summary Table   Stim Site NR Onset (ms) Norm Onset (ms) O-P Amp (mV) Norm O-P Amp Site1 Site2 Delta-0 (ms) Dist (cm) Vel (m/s) Norm Vel (m/s)  Right Median Motor (Abd Poll Brev)  29.8C  Wrist    3.7 <4.2 8.3 >5 Elbow Wrist 4.0 20.0 50 >50  Elbow    7.7  7.9         Right Ulnar Motor (Abd Dig Min)   29.8C  Wrist    3.0 <4.2 11.4 >3 B Elbow Wrist 3.5 17.5 *50 >53  B Elbow    6.5  11.4  A Elbow B Elbow 1.5 10.0 67 >53  A Elbow    8.0  11.1          EMG   Side Muscle Nerve Root Ins Act Fibs Psw Amp Dur Poly Recrt Int Fraser Din Comment  Right Abd Poll Brev Median C8-T1 Nml Nml Nml Nml Nml 0 Nml Nml   Right 1stDorInt Ulnar C8-T1 Nml Nml Nml Nml Nml 0 Nml Nml   Right PronatorTeres Median C6-7 Nml Nml Nml Nml Nml 0 Nml Nml   Right Biceps Musculocut C5-6 Nml Nml Nml Nml Nml 0 Nml Nml   Right Deltoid Axillary C5-6 Nml Nml Nml Nml Nml 0 Nml Nml     Nerve Conduction Studies Anti Sensory Left/Right Comparison   Stim Site L Lat (ms) R Lat (ms) L-R Lat (ms) L Amp (V) R Amp (V) L-R Amp (%) Site1 Site2 L Vel (m/s) R Vel (m/s) L-R Vel (m/s)  Median Acr Palm Anti Sensory (2nd Digit)  30.1C  Wrist  3.8   50.1  Wrist Palm     Palm  *2.4   48.8        Radial Anti Sensory (Base 1st Digit)  29.7C  Wrist  2.6   30.7  Wrist Base 1st Digit     Ulnar Anti Sensory (5th Digit)  30.1C  Wrist  *4.2   30.0  Wrist 5th Digit  *33    Motor Left/Right Comparison   Stim Site L Lat (ms) R Lat (ms) L-R Lat (ms) L Amp (mV) R Amp (mV) L-R Amp (%) Site1 Site2 L Vel (m/s) R Vel (m/s) L-R Vel (m/s)  Median Motor (Abd Poll Brev)  29.8C  Wrist  3.7   8.3  Elbow Wrist  50   Elbow  7.7   7.9        Ulnar Motor (Abd Dig Min)  29.8C  Wrist  3.0   11.4  B Elbow Wrist  *50   B Elbow  6.5   11.4  A Elbow B Elbow  67   A Elbow  8.0   11.1           Waveforms:

## 2022-04-21 ENCOUNTER — Ambulatory Visit (INDEPENDENT_AMBULATORY_CARE_PROVIDER_SITE_OTHER): Payer: No Typology Code available for payment source | Admitting: Orthopedic Surgery

## 2022-04-21 DIAGNOSIS — G5620 Lesion of ulnar nerve, unspecified upper limb: Secondary | ICD-10-CM

## 2022-04-21 DIAGNOSIS — G5621 Lesion of ulnar nerve, right upper limb: Secondary | ICD-10-CM

## 2022-04-21 HISTORY — DX: Lesion of ulnar nerve, unspecified upper limb: G56.20

## 2022-04-21 NOTE — Progress Notes (Signed)
Office Visit Note   Patient: Yolanda [name]           Date of Birth: 02-17-1961           MRN: [id number] Visit Date: 04/21/2022              Requested by: Flossie Buffy, NP [place],  [place] [address] PCP: Flossie Buffy, NP   Assessment & Plan: Visit Diagnoses:  1. Cubital tunnel syndrome on right     Plan: Patient presents for review of her recent EMG/nerve conduction study.  We reviewed the results which are consistent with mild right cubital tunnel syndrome.  She also has some ulnar nerve instability on exam.  We discussed that given that she has failed conservative management with extensive occupational therapy and night splinting of the elbow, she would benefit from ulnar nerve decompression with or without transposition.  We reviewed the risk of surgery including bleeding, infection, damage to ulnar nerve or its branches, incomplete symptom relief, need for additional surgery.  Patient is not ready for surgery yet but may elect to proceed after tennis season.  Follow-Up Instructions: No follow-ups on file.   Orders:  No orders of the defined types were placed in this encounter.  No orders of the defined types were placed in this encounter.     Procedures: No procedures performed   Clinical Data: No additional findings.   Subjective: Chief Complaint  Patient presents with   Right Elbow - Follow-up    EMG/NCS    This is a 61 year old right-hand-dominant female presents with right medial elbow pain and numbness and paresthesias involving the small and ring fingers.  She recently underwent EMG/nerve conduction study which was consistent with mild ulnar neuropathy at the elbow.  She has no weakness in her hands and no intrinsic atrophy.  She does have palpable ulnar nerve instability which is quite bothersome for her with certain activities.  She has been able to play tennis and golf although with some discomfort of the medial elbow.  She  has done extensive occupational therapy and has been wearing an extension brace at night with persistent symptoms.    Review of Systems   Objective: Vital Signs: There were no vitals taken for this visit.  Physical Exam  Right Elbow Exam   Tenderness  The patient is experiencing tenderness in the medial epicondyle.   Range of Motion  The patient has normal right elbow ROM.  Muscle Strength  The patient has normal right elbow strength.  Other  Erythema: absent Sensation: normal Pulse: present  Comments:  Positive Tinel at elbow.  Anterior subluxation of ulnar nerve with flexion/extension.  Her TTP seems mostly over the FCU muscle belly along the path of the ulnar nerve.       Specialty Comments:  No specialty comments available.  Imaging: No results found.   PMFS History: Patient Active Problem List   Diagnosis Date Noted   Cubital tunnel syndrome on right 04/21/2022   Numbness and tingling in right hand 04/04/2022   Chronic pain of right elbow 10/22/2021   Medial epicondylitis of elbow, left 06/15/2020   Renal cyst, right 09/19/2019   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/18/2019   Lumbar radiculopathy 09/09/2019   Low back pain 05/27/2019   Closed fracture of coccyx ([place]) 12/04/2018   Pain in the coccyx 12/04/2018   Well woman exam without gynecological exam 03/20/2018   Food allergy 03/20/2018   SOB (shortness of  breath) on exertion 03/20/2018   Genetic testing 12/23/2014   Family history of breast cancer    Family history of uterine cancer    Osteoporosis 10/04/2014   Pancreatic duct dilated 05/10/2012   IBS 08/01/2008   UNS ADVRS EFF UNS RX MEDICINAL&BIOLOGICAL SBSTNC 01/21/2008   Depression, major, single episode, complete remission ([place]) 01/18/2008   RHINITIS, CHRONIC 01/18/2008   Past Medical History:  Diagnosis Date   Acute appendicitis 09/14/2018   Allergy    Anemia    Anxiety    Asthma    BRCA negative 12/2014   Depression     Endometriosis    Esophagitis    Fibroid    GERD (gastroesophageal reflux disease)    Hiatal hernia    History of diverticulitis    IC (interstitial cystitis)    Osteoporosis 01/2019   2018 T score -2.6, 2020 T score -2.0 on Actonel   Schatzki's ring     Family History  Problem Relation Age of Onset   Uterine cancer Maternal Grandmother        dx in her 8s   Breast cancer Sister        paternal half sister dx in her early 47s   Cystic fibrosis Other        brother's granddauther   Emphysema Father    Early death Father    Breast cancer Sister        paternal half sister dx in her late 72s   Cystic fibrosis Other        brothers granddaughters   Depression Mother    Hyperlipidemia Mother    Hypertension Mother    Mental illness Mother    49 / [place] Mother    Diabetes Brother 38       HALF BROTHER/HALF BROTHER    Breast cancer Sister    GI problems Brother    Colon cancer Neg Hx    Esophageal cancer Neg Hx    Rectal cancer Neg Hx    Stomach cancer Neg Hx    Allergic rhinitis Neg Hx    Asthma Neg Hx    Eczema Neg Hx    Urticaria Neg Hx     Past Surgical History:  Procedure Laterality Date   CHOLECYSTECTOMY     HYSTEROSCOPY  12/2003   RESECTON OF SUBMUCOUS  MYOMA/DX SCOPE   LAPAROSCOPIC APPENDECTOMY N/A 09/14/2018   Procedure: APPENDECTOMY LAPAROSCOPIC;  Surgeon: Ileana Roup, MD;  Location: [facility];  Service: General;  Laterality: N/A;   LAPAROSCOPIC BILATERAL SALPINGO OOPHERECTOMY Bilateral 07/02/2014   Procedure: LAPAROSCOPIC BILATERAL SALPINGO OOPHORECTOMY;  Surgeon: Anastasio Auerbach, MD;  Location: [facility];  Service: Gynecology;  Laterality: Bilateral;   PELVIC LAPAROSCOPY  12/2003   DX SCOPE/RESECTION OF SUBMUCOUS MYOMA   TMJ ARTHROSCOPY  11-26-2001   VAGINAL HYSTERECTOMY  12/05   Menorrhagia/dysmenorrhea. Pathology showed leiomyoma/adenomyosis   Social History   Occupational History   Not on file  Tobacco Use   Smoking status:  Never   Smokeless tobacco: Never  Vaping Use   Vaping Use: Never used  Substance and Sexual Activity   Alcohol use: Yes    Alcohol/week: 2.0 standard drinks of alcohol    Types: 2 Glasses of wine per week    Comment: week   Drug use: No   Sexual activity: Yes    Birth control/protection: Surgical    Comment: HYST-1st intercourse 61 yo-Fewer than 5 partners

## 2022-05-20 ENCOUNTER — Ambulatory Visit (INDEPENDENT_AMBULATORY_CARE_PROVIDER_SITE_OTHER): Payer: No Typology Code available for payment source

## 2022-05-20 DIAGNOSIS — J309 Allergic rhinitis, unspecified: Secondary | ICD-10-CM | POA: Diagnosis not present

## 2022-05-25 ENCOUNTER — Encounter (HOSPITAL_BASED_OUTPATIENT_CLINIC_OR_DEPARTMENT_OTHER): Payer: Self-pay | Admitting: Orthopedic Surgery

## 2022-05-25 ENCOUNTER — Other Ambulatory Visit: Payer: Self-pay

## 2022-06-01 ENCOUNTER — Ambulatory Visit (HOSPITAL_BASED_OUTPATIENT_CLINIC_OR_DEPARTMENT_OTHER): Payer: No Typology Code available for payment source | Admitting: Anesthesiology

## 2022-06-01 ENCOUNTER — Telehealth: Payer: Self-pay

## 2022-06-01 ENCOUNTER — Encounter (HOSPITAL_BASED_OUTPATIENT_CLINIC_OR_DEPARTMENT_OTHER)
Admission: RE | Disposition: A | Discharge status: Home / Self Care | Payer: Self-pay | Source: Home / Self Care | Attending: Orthopedic Surgery

## 2022-06-01 ENCOUNTER — Other Ambulatory Visit: Payer: Self-pay

## 2022-06-01 ENCOUNTER — Encounter (HOSPITAL_BASED_OUTPATIENT_CLINIC_OR_DEPARTMENT_OTHER): Payer: Self-pay | Admitting: Orthopedic Surgery

## 2022-06-01 ENCOUNTER — Ambulatory Visit (HOSPITAL_BASED_OUTPATIENT_CLINIC_OR_DEPARTMENT_OTHER)
Admission: RE | Admit: 2022-06-01 | Discharge: 2022-06-01 | Disposition: A | Discharge status: Home / Self Care | Payer: No Typology Code available for payment source | Attending: Orthopedic Surgery | Admitting: Orthopedic Surgery

## 2022-06-01 DIAGNOSIS — K449 Diaphragmatic hernia without obstruction or gangrene: Secondary | ICD-10-CM | POA: Diagnosis not present

## 2022-06-01 DIAGNOSIS — Z79899 Other long term (current) drug therapy: Secondary | ICD-10-CM | POA: Diagnosis not present

## 2022-06-01 DIAGNOSIS — G5621 Lesion of ulnar nerve, right upper limb: Secondary | ICD-10-CM | POA: Insufficient documentation

## 2022-06-01 DIAGNOSIS — J45909 Unspecified asthma, uncomplicated: Secondary | ICD-10-CM

## 2022-06-01 DIAGNOSIS — F418 Other specified anxiety disorders: Secondary | ICD-10-CM

## 2022-06-01 HISTORY — PX: ULNAR TUNNEL RELEASE: SHX820

## 2022-06-01 SURGERY — RELEASE, CUBITAL TUNNEL
Anesthesia: Regional | Site: Elbow | Laterality: Right

## 2022-06-01 MED ORDER — ONDANSETRON HCL 4 MG/2ML IJ SOLN
INTRAMUSCULAR | Status: DC | PRN
  Administered 2022-06-01: 4 mg via INTRAVENOUS

## 2022-06-01 MED ORDER — PROPOFOL 500 MG/50ML IV EMUL
INTRAVENOUS | Status: DC | PRN
  Administered 2022-06-01: 100 ug/kg/min via INTRAVENOUS

## 2022-06-01 MED ORDER — OXYCODONE HCL 5 MG PO TABS: 5.0000 mg | ORAL_TABLET | Freq: Once | ORAL | Status: DC | PRN

## 2022-06-01 MED ORDER — LACTATED RINGERS IV SOLN: INTRAVENOUS | Status: DC

## 2022-06-01 MED ORDER — ACETAMINOPHEN 10 MG/ML IV SOLN: 1000.0000 mg | Freq: Once | INTRAVENOUS | Status: DC | PRN

## 2022-06-01 MED ORDER — PROMETHAZINE HCL 25 MG/ML IJ SOLN: 6.2500 mg | INTRAMUSCULAR | Status: DC | PRN

## 2022-06-01 MED ORDER — FENTANYL CITRATE (PF) 100 MCG/2ML IJ SOLN: 25.0000 ug | INTRAMUSCULAR | Status: DC | PRN

## 2022-06-01 MED ORDER — CEFAZOLIN SODIUM-DEXTROSE 2-4 GM/100ML-% IV SOLN
INTRAVENOUS | Status: AC
  Filled 2022-06-01: qty 100

## 2022-06-01 MED ORDER — FENTANYL CITRATE (PF) 100 MCG/2ML IJ SOLN
INTRAMUSCULAR | Status: AC
  Filled 2022-06-01: qty 2

## 2022-06-01 MED ORDER — AMISULPRIDE (ANTIEMETIC) 5 MG/2ML IV SOLN: 10.0000 mg | Freq: Once | INTRAVENOUS | Status: DC | PRN

## 2022-06-01 MED ORDER — FENTANYL CITRATE (PF) 100 MCG/2ML IJ SOLN
INTRAMUSCULAR | Status: DC | PRN
  Administered 2022-06-01 (×2): 25 ug via INTRAVENOUS
  Administered 2022-06-01: 50 ug via INTRAVENOUS

## 2022-06-01 MED ORDER — 0.9 % SODIUM CHLORIDE (POUR BTL) OPTIME
TOPICAL | Status: DC | PRN
  Administered 2022-06-01: 150 mL

## 2022-06-01 MED ORDER — ONDANSETRON HCL 4 MG/2ML IJ SOLN
INTRAMUSCULAR | Status: AC
  Filled 2022-06-01: qty 2

## 2022-06-01 MED ORDER — ROPIVACAINE HCL 5 MG/ML IJ SOLN
INTRAMUSCULAR | Status: DC | PRN
  Administered 2022-06-01: 30 mL via PERINEURAL

## 2022-06-01 MED ORDER — OXYCODONE HCL 5 MG/5ML PO SOLN: 5.0000 mg | Freq: Once | ORAL | Status: DC | PRN

## 2022-06-01 MED ORDER — CEFAZOLIN SODIUM-DEXTROSE 2-4 GM/100ML-% IV SOLN
2.0000 g | INTRAVENOUS | Status: AC
  Administered 2022-06-01: 2 g via INTRAVENOUS

## 2022-06-01 MED ORDER — MIDAZOLAM HCL 2 MG/2ML IJ SOLN
2.0000 mg | Freq: Once | INTRAMUSCULAR | Status: AC
  Administered 2022-06-01: 2 mg via INTRAVENOUS

## 2022-06-01 MED ORDER — MIDAZOLAM HCL 2 MG/2ML IJ SOLN
INTRAMUSCULAR | Status: AC
  Filled 2022-06-01: qty 2

## 2022-06-01 SURGICAL SUPPLY — 56 items
ADH SKN CLS APL DERMABOND .7 (GAUZE/BANDAGES/DRESSINGS)
APL PRP STRL LF DISP 70% ISPRP (MISCELLANEOUS) ×1
BLADE SURG 15 STRL LF DISP TIS (BLADE) ×1 IMPLANT
BLADE SURG 15 STRL SS (BLADE) ×1
BNDG CMPR 9X4 STRL LF SNTH (GAUZE/BANDAGES/DRESSINGS) ×1
BNDG ELASTIC 3X5.8 VLCR STR LF (GAUZE/BANDAGES/DRESSINGS) ×1 IMPLANT
BNDG ESMARK 4X9 LF (GAUZE/BANDAGES/DRESSINGS) ×1 IMPLANT
BNDG GAUZE DERMACEA FLUFF 4 (GAUZE/BANDAGES/DRESSINGS) ×1 IMPLANT
BNDG GZE DERMACEA 4 6PLY (GAUZE/BANDAGES/DRESSINGS) ×1
BNDG PLASTER X FAST 3X3 WHT LF (CAST SUPPLIES) IMPLANT
BNDG PLSTR 9X3 FST ST WHT (CAST SUPPLIES)
CHLORAPREP W/TINT 26 (MISCELLANEOUS) ×1 IMPLANT
CORD BIPOLAR FORCEPS 12FT (ELECTRODE) ×1 IMPLANT
COVER BACK TABLE 60X90IN (DRAPES) ×1 IMPLANT
COVER MAYO STAND STRL (DRAPES) ×1 IMPLANT
CUFF TOURN SGL QUICK 18X4 (TOURNIQUET CUFF) IMPLANT
CUFF TOURN SGL QUICK 24 (TOURNIQUET CUFF)
CUFF TRNQT CYL 24X4X16.5-23 (TOURNIQUET CUFF) IMPLANT
DERMABOND ADVANCED .7 DNX12 (GAUZE/BANDAGES/DRESSINGS) IMPLANT
DRAPE C-ARM 35X43 STRL (DRAPES) ×1 IMPLANT
DRAPE EXTREMITY T 121X128X90 (DISPOSABLE) ×1 IMPLANT
DRAPE IMP U-DRAPE 54X76 (DRAPES) IMPLANT
DRAPE SURG 17X23 STRL (DRAPES) IMPLANT
GAUZE SPONGE 4X4 12PLY STRL (GAUZE/BANDAGES/DRESSINGS) ×1 IMPLANT
GAUZE XEROFORM 1X8 LF (GAUZE/BANDAGES/DRESSINGS) IMPLANT
GLOVE BIO SURGEON STRL SZ7 (GLOVE) ×1 IMPLANT
GLOVE BIOGEL PI IND STRL 7.0 (GLOVE) ×1 IMPLANT
GOWN STRL REUS W/ TWL LRG LVL3 (GOWN DISPOSABLE) ×1 IMPLANT
GOWN STRL REUS W/ TWL XL LVL3 (GOWN DISPOSABLE) ×1 IMPLANT
GOWN STRL REUS W/TWL LRG LVL3 (GOWN DISPOSABLE) ×1
GOWN STRL REUS W/TWL XL LVL3 (GOWN DISPOSABLE) ×1
NDL HYPO 25X1 1.5 SAFETY (NEEDLE) IMPLANT
NEEDLE HYPO 25X1 1.5 SAFETY (NEEDLE) IMPLANT
NS IRRIG 1000ML POUR BTL (IV SOLUTION) ×1 IMPLANT
PACK BASIN DAY SURGERY FS (CUSTOM PROCEDURE TRAY) ×1 IMPLANT
PAD CAST 3X4 CTTN HI CHSV (CAST SUPPLIES) ×1 IMPLANT
PADDING CAST ABS COTTON 3X4 (CAST SUPPLIES) IMPLANT
PADDING CAST COTTON 3X4 STRL (CAST SUPPLIES) ×2
SLEEVE SCD COMPRESS KNEE MED (STOCKING) IMPLANT
SLING ARM IMMOBILIZER LRG (SOFTGOODS) IMPLANT
SPLINT FIBERGLASS 4X30 (CAST SUPPLIES) IMPLANT
STRIP CLOSURE SKIN 1/2X4 (GAUZE/BANDAGES/DRESSINGS) IMPLANT
SUCTION FRAZIER HANDLE 10FR (MISCELLANEOUS) ×1
SUCTION TUBE FRAZIER 10FR DISP (MISCELLANEOUS) IMPLANT
SUT ETHIBOND 3-0 V-5 (SUTURE) IMPLANT
SUT ETHILON 3 0 PS 1 (SUTURE) ×1 IMPLANT
SUT ETHILON 4 0 PS 2 18 (SUTURE) IMPLANT
SUT MNCRL AB 3-0 PS2 18 (SUTURE) IMPLANT
SUT MNCRL AB 4-0 PS2 18 (SUTURE) IMPLANT
SUT VICRYL 0 SH 27 (SUTURE) IMPLANT
SUT VICRYL 4-0 PS2 18IN ABS (SUTURE) IMPLANT
SYR BULB EAR ULCER 3OZ GRN STR (SYRINGE) ×1 IMPLANT
SYR CONTROL 10ML LL (SYRINGE) IMPLANT
TOWEL GREEN STERILE FF (TOWEL DISPOSABLE) ×2 IMPLANT
TUBE CONNECTING 20X1/4 (TUBING) IMPLANT
UNDERPAD 30X36 HEAVY ABSORB (UNDERPADS AND DIAPERS) ×1 IMPLANT

## 2022-06-01 NOTE — Discharge Instructions (Addendum)
Post Anesthesia Home Care Instructions  Activity: Get plenty of rest for the remainder of the day. A responsible individual must stay with you for 24 hours following the procedure.  For the next 24 hours, DO NOT: -Drive a car -Paediatric nurse -Drink alcoholic beverages -Take any medication unless instructed by your physician -Make any legal decisions or sign important papers.  Meals: Start with liquid foods such as gelatin or soup. Progress to regular foods as tolerated. Avoid greasy, spicy, heavy foods. If nausea and/or vomiting occur, drink only clear liquids until the nausea and/or vomiting subsides. Call your physician if vomiting continues.  Special Instructions/Symptoms: Your throat may feel dry or sore from the anesthesia or the breathing tube placed in your throat during surgery. If this causes discomfort, gargle with warm salt water. The discomfort should disappear within 24 hours.  If you had a scopolamine patch placed behind your ear for the management of post- operative nausea and/or vomiting:  1. The medication in the patch is effective for 72 hours, after which it should be removed.  Wrap patch in a tissue and discard in the trash. Wash hands thoroughly with soap and water. 2. You may remove the patch earlier than 72 hours if you experience unpleasant side effects which may include dry mouth, dizziness or visual disturbances. 3. Avoid touching the patch. Wash your hands with soap and water after contact with the patch.     Regional Anesthesia Blocks  1. Numbness or the inability to move the "blocked" extremity may last from 3-48 hours after placement. The length of time depends on the medication injected and your individual response to the medication. If the numbness is not going away after 48 hours, call your surgeon.  2. The extremity that is blocked will need to be protected until the numbness is gone and the  Strength has returned. Because you cannot feel it, you will  need to take extra care to avoid injury. Because it may be weak, you may have difficulty moving it or using it. You may not know what position it is in without looking at it while the block is in effect.  3. For blocks in the legs and feet, returning to weight bearing and walking needs to be done carefully. You will need to wait until the numbness is entirely gone and the strength has returned. You should be able to move your leg and foot normally before you try and bear weight or walk. You will need someone to be with you when you first try to ensure you do not fall and possibly risk injury.  4. Bruising and tenderness at the needle site are common side effects and will resolve in a few days.  5. Persistent numbness or new problems with movement should be communicated to the surgeon or the [place] [phone] [facility] [phone]).      Audria Nine, M.D. Hand Surgery  POST-OPERATIVE DISCHARGE INSTRUCTIONS   PRESCRIPTIONS: You may have been given a prescription to be taken as directed for post-operative pain control.  You may also take over the counter ibuprofen/aleve and tylenol for pain. Take this as directed on the packaging. Do not exceed 3000 mg tylenol/acetaminophen in 24 hours.  Ibuprofen 600-800 mg (3-4) tablets by mouth every 6 hours as needed for pain.  OR Aleve 2 tablets by mouth every 12 hours (twice daily) as needed for pain.  AND/OR Tylenol 1000 mg (2 tablets) every 8 hours as needed for pain.  Please use your pain medication carefully, as refills are limited and you may not be provided with one.  As stated above, please use over the counter pain medicine - it will also be helpful with decreasing your swelling.    ANESTHESIA: After your surgery, post-surgical discomfort or pain is likely. This discomfort can last several days to a few weeks. At certain times of the day your discomfort may be more intense.   Did you receive a  nerve block?  A nerve block can provide pain relief for one hour to two days after your surgery. As long as the nerve block is working, you will experience little or no sensation in the area the surgeon operated on.  As the nerve block wears off, you will begin to experience pain or discomfort. It is very important that you begin taking your prescribed pain medication before the nerve block fully wears off. Treating your pain at the first sign of the block wearing off will ensure your pain is better controlled and more tolerable when full-sensation returns. Do not wait until the pain is intolerable, as the medicine will be less effective. It is better to treat pain in advance than to try and catch up.   General Anesthesia:  If you did not receive a nerve block during your surgery, you will need to start taking your pain medication shortly after your surgery and should continue to do so as prescribed by your surgeon.     ICE AND ELEVATION: You may use ice for the first 48-72 hours, but it is not critical.   Motion of your fingers is very important to decrease the swelling.  Elevation, as much as possible for the next 48 hours, is critical for decreasing swelling as well as for pain relief. Elevation means when you are seated or lying down, you hand should be at or above your heart. When walking, the hand needs to be at or above the level of your elbow.  If the bandage gets too tight, it may need to be loosened. Please contact our office and we will instruct you in how to do this.    SURGICAL BANDAGES:  Keep your dressing and/or splint clean and dry at all times.  Do not remove until you are seen again in the office or by hand therapy.  If careful, you may place a plastic bag over your bandage and tape the end to shower, but be careful, do not get your bandages wet.     HAND THERAPY:  You will begin hand therapy before you see me in the office.  They will contact you to schedule the first date and  time.    ACTIVITY AND WORK: You are encouraged to move any fingers which are not in the bandage.  Light use of the fingers is allowed to assist the other hand with daily hygiene and eating, but strong gripping or lifting is often uncomfortable and should be avoided.     [facility] [address], [address] [place], [place] [address] [phone]

## 2022-06-01 NOTE — Anesthesia Procedure Notes (Signed)
Anesthesia Regional Block: Supraclavicular block   Pre-Anesthetic Checklist: , timeout performed,  Correct Patient, Correct Site, Correct Laterality,  Correct Procedure, Correct Position, site marked,  Risks and benefits discussed,  Surgical consent,  Pre-op evaluation,  At surgeon's request and post-op pain management  Laterality: Right  Prep: chloraprep       Needles:  Injection technique: Single-shot  Needle Type: Echogenic Stimulator Needle     Needle Length: 9cm  Needle Gauge: 21     Additional Needles:   Procedures:,,,, ultrasound used (permanent image in chart),,    Narrative:  Start time: 06/01/2022 11:10 AM End time: 06/01/2022 11:20 AM Injection made incrementally with aspirations every 5 mL.  Performed by: Personally  Anesthesiologist: Murvin Natal, MD  Additional Notes: Functioning IV was confirmed and monitors were applied.  A timeout was performed. Sterile prep, hand hygiene and sterile gloves were used. A 78m 21ga Arrow echogenic stimulator needle was used. Negative aspiration and negative test dose prior to incremental administration of local anesthetic. The patient tolerated the procedure well.  Ultrasound guidance: relevent anatomy identified, needle position confirmed, local anesthetic spread visualized around nerve(s), vascular puncture avoided.  Image printed for medical record.

## 2022-06-01 NOTE — Op Note (Signed)
Date of Surgery: 06/01/2022  INDICATIONS: Patient is a 61 y.o.-year-old female with right cubital tunnel syndrome and medial elbow pain that has failed conservative management with activity modification, splinting, oral NSAID use, and prolonged hand therapy.  An EMG/NCS was consistent with mild cubital tunnel syndrome.  Patient has evidence of ulnar nerve subluxation on exam.  Risks, benefits, and alternatives to surgery were again discussed with the patient in the preoperative area. The patient wishes to proceed with surgery.  Informed consent was signed after our discussion.   PREOPERATIVE DIAGNOSIS:  Right cubital tunnel syndrome  POSTOPERATIVE DIAGNOSIS: Same.  PROCEDURE:  Right cubital tunnel release (86767) Z-lengthening of flexor pronator fascia (20947)   SURGEON: Audria Nine, M.D.  ASSIST:   ANESTHESIA:  Regional + sedation  IV FLUIDS AND URINE: See anesthesia.  ESTIMATED BLOOD LOSS: 20 mL.  IMPLANTS: * No implants in log *   DRAINS: None  COMPLICATIONS: None  DESCRIPTION OF PROCEDURE: The patient was met in the preoperative holding area where the surgical site was marked and the consent form was signed.  The patient was then taken to the operating room and transferred to the operating table.  All bony prominences were well padded.   Monitored sedation was induced.  The operative extremity was prepped and draped in the usual and sterile fashion and a sterile tourniquet applied to the upper arm.  A formal time-out was performed to confirm that this was the correct patient, surgery, side, and site.   Following formal timeout, the limb was exsanguinated with an Esmarch bandage and the tourniquet inflated to 250 mmHg.  A curvilinear incision was made at the medial elbow just posterior to the medial epicondyle and in line with the medial intermuscular septum which was easily palpable.  The skin was incised.  Small crossing vessels were coagulated with the bipolar  electrocautery.  The ulnar nerve was identified just proximal to the medial epicondyle posterior to the intermuscular septum.  Osborne's ligament was released over the ulnar nerve.  The nerve was fully decompressed proximally to the level of the arcade of Strut[place].  The complex vascular plexus around the nerve was identified and protected.  The fascia overlying the FCU muscle bellies was sharply released.  Blunt dissection was used to divide the ulnar and humeral heads of the flexor carpi ulnaris.  Small muscle branches from the nerve to the FCU muscle belly were identified and protected.  The elbow was taken through gentle range of motion and was found to sublux over the medial condyle.  The decision was then made to proceed with an anterior transposition of the nerve.  A portion of the medial intermuscular septum was sharply excised to prevent kinking or damage to the nerve.  The nerve was circumferentially decompressed from the proximal aspect of the incision all the way to the FCU muscle belly.  Small cutaneous branches of the nerve were protected.  The medial antebrachial cutaneous nerve was identified at the very distal aspect of the wound and was left undisturbed.  A Z-plasty was performed of the fascia of midline in the flexor pronator mass.  The nerve was transposed anteriorly and these fascial flaps were sewn together using a 3-0 Ethibond suture to prevent subluxation of the nerve back into its anatomic position.  The path of the nerve was then inspected from proximal to distal in any fascial bands were released.  The nerve had a straight path from proximal to distal with the arm in an extended position.  There was no kinking of the nerve when the elbow was flexed and the nerve was quite loose without any evidence of constriction.  The fascial flaps were very loose without any evidence of compression.  Following acceptable anterior transposition and no evidence of kinking or secondary points of  constriction, the tourniquet was let down.  Hemostasis was achieved with a combination of direct pressure over the wound and bipolar electrocautery.  The wound was then thoroughly irrigated with copious sterile saline.  Was closed in layered fashion using a 3-0 Monocryl suture in a very directed fashion followed by 4-0 nylon suture in a horizontal mattress fashion.  The wound was then cleaned and dressed with Xeroform, folded Kerlix gauze, cast padding, and a well-padded posterior long-arm splint was applied.  The fingers are all warm and well-perfused with brisk capillary refill with the tourniquet let down.  The patient was reversed from sedation.  She was transferred from the operating table to the postoperative bed.  All counts were correct x 2 at the end of the procedure.  The patient was then taken to the PACU in stable condition.   POSTOPERATIVE PLAN: She will be discharged home from the PACU with appropriate pain medication and discharge instructions.  A referral has been made to hand therapy for 5-7 days from today.  I will see her back in 10-14 days at which point we will remove her sutures.   Audria Nine, MD 2:51 PM

## 2022-06-01 NOTE — Interval H&P Note (Signed)
History and Physical Interval Note:  06/01/2022 11:55 AM  Yolanda [name]  has presented today for surgery, with the diagnosis of Right cubital tunnel syndrome.  The various methods of treatment have been discussed with the patient and family. After consideration of risks, benefits and other options for treatment, the patient has consented to  Procedure(s) with comments: [place] (Right) - 60 as a surgical intervention.  The patient's history has been reviewed, patient examined, no change in status, stable for surgery.  I have reviewed the patient's chart and labs.  Questions were answered to the patient's satisfaction.     Marguerette Sheller Moustapha Tooker

## 2022-06-01 NOTE — Brief Op Note (Signed)
06/01/2022  2:04 PM  PATIENT:  Yolanda [name]  61 y.o. female  PRE-OPERATIVE DIAGNOSIS:  Right cubital tunnel syndrome  POST-OPERATIVE DIAGNOSIS:  Right cubital tunnel syndrome  PROCEDURE:  Procedure(s) with comments: CUBITAL TUNNEL RELEASE WITH POSSIBLE ANTERIOR TRANSPOSITION (Right) - 35  SURGEON:  Surgeon(s) and Role:    * Sherilyn Cooter, MD - Primary  PHYSICIAN ASSISTANT:   ASSISTANTS: none   ANESTHESIA:   regional and IV sedation  EBL:  20 mL   BLOOD ADMINISTERED:none  DRAINS: none   LOCAL MEDICATIONS USED:  NONE  SPECIMEN:  No Specimen  DISPOSITION OF SPECIMEN:  N/A  COUNTS:  YES  TOURNIQUET:   Total Tourniquet Time Documented: Upper Arm (Right) - 71 minutes Total: Upper Arm (Right) - 71 minutes   DICTATION: .Viviann Spare Dictation  PLAN OF CARE: Discharge to home after PACU  PATIENT DISPOSITION:  PACU - hemodynamically stable.   Delay start of Pharmacological VTE agent (>24hrs) due to surgical blood loss or risk of bleeding: not applicable

## 2022-06-01 NOTE — Anesthesia Postprocedure Evaluation (Signed)
Anesthesia Post Note  Patient: Yolanda [name]  Procedure(s) Performed: CUBITAL TUNNEL RELEASE WITH POSSIBLE ANTERIOR TRANSPOSITION (Right: Elbow)     Patient location during evaluation: PACU Anesthesia Type: Regional and MAC Level of consciousness: awake and alert Pain management: pain level controlled Vital Signs Assessment: post-procedure vital signs reviewed and stable Respiratory status: spontaneous breathing, nonlabored ventilation, respiratory function stable and patient connected to nasal cannula oxygen Cardiovascular status: stable and blood pressure returned to baseline Postop Assessment: no apparent nausea or vomiting Anesthetic complications: no   No notable events documented.  Last Vitals:  Vitals:   06/01/22 1415 06/01/22 1425  BP: 118/70 128/68  Pulse: 67 67  Resp: 14 12  Temp:    SpO2: 95% 96%    Last Pain:  Vitals:   06/01/22 1425  TempSrc:   PainSc: 0-No pain                 Bandon Sherwin L Daylani Deblois

## 2022-06-01 NOTE — Anesthesia Preprocedure Evaluation (Addendum)
Anesthesia Evaluation  Patient identified by MRN, date of birth, ID band Patient awake    Reviewed: Allergy & Precautions, NPO status , Patient's Chart, lab work & pertinent test results  Airway Mallampati: II  TM Distance: >3 FB Neck ROM: Full    Dental no notable dental hx.    Pulmonary asthma ,    Pulmonary exam normal        Cardiovascular negative cardio ROS Normal cardiovascular exam     Neuro/Psych PSYCHIATRIC DISORDERS Anxiety Depression  Neuromuscular disease    GI/Hepatic Neg liver ROS, hiatal hernia,   Endo/Other  negative endocrine ROS  Renal/GU negative Renal ROS     Musculoskeletal negative musculoskeletal ROS (+)   Abdominal   Peds  Hematology negative hematology ROS (+)   Anesthesia Other Findings Right cubital tunnel syndrome  Reproductive/Obstetrics                            Anesthesia Physical Anesthesia Plan  ASA: 2  Anesthesia Plan: Regional   Post-op Pain Management:    Induction: Intravenous  PONV Risk Score and Plan: 2 and Ondansetron, Dexamethasone, Propofol infusion, Midazolam and Treatment may vary due to age or medical condition  Airway Management Planned: Simple Face Mask  Additional Equipment:   Intra-op Plan:   Post-operative Plan:   Informed Consent: I have reviewed the patients History and Physical, chart, labs and discussed the procedure including the risks, benefits and alternatives for the proposed anesthesia with the patient or authorized representative who has indicated his/her understanding and acceptance.     Dental advisory given  Plan Discussed with: CRNA  Anesthesia Plan Comments:         Anesthesia Quick Evaluation

## 2022-06-01 NOTE — Progress Notes (Signed)
Assisted Dr. Ellender with right, supraclavicular, ultrasound guided block. Side rails up, monitors on throughout procedure. See vital signs in flow sheet. Tolerated Procedure well. 

## 2022-06-01 NOTE — Telephone Encounter (Signed)
Transition Care Management Follow-up Telephone Call Date of discharge and from where: 06/01/22 [place] How have you been since you were released from the hospital? I'm doing good. Any questions or concerns? No  Items Reviewed: Did the pt receive and understand the discharge instructions provided? Yes  Medications obtained and verified? Yes  Other? No  Any new allergies since your discharge? No  Dietary orders reviewed? Yes Do you have support at home? Yes   Home Care and Equipment/Supplies: Were home health services ordered? not applicable If so, what is the name of the agency? N/a  Has the agency set up a time to come to the patient's home? not applicable Were any new equipment or medical supplies ordered?  No What is the name of the medical supply agency? N/a Were you able to get the supplies/equipment? not applicable Do you have any questions related to the use of the equipment or supplies? No  Functional Questionnaire: (I = Independent and D = Dependent) ADLs: I  Bathing/Dressing- I  Meal Prep- I  Eating- I  Maintaining continence- I  Transferring/Ambulation- I  Managing Meds- I  Follow up appointments reviewed:  PCP Hospital f/u appt confirmed? No  Scheduled to see n/a on n/a @ n/a. Pt does not feel the need to schedule a hosp f/u with PCP since she will have a f/u appt with the surgeon. [facility] f/u appt confirmed? No  Scheduled to see (Not scheduled yet) on (Not scheduled yet) @ (Not scheduled yet). Are transportation arrangements needed? No  If their condition worsens, is the pt aware to call PCP or go to the Emergency Dept.? Yes Was the patient provided with contact information for the PCP's office or ED? Yes Was to pt encouraged to call back with questions or concerns? Yes   Angeline Slim, RN, BSN

## 2022-06-01 NOTE — H&P (Signed)
HAND SURGERY  HPI: Yolanda [name] is a 61 y.o. female who presents with with chronic right medial elbow pain w/ associated ring and small finger paresthesias.  This has been going on since around June.  She has failed conservative management with bracing, immobilization, oral NSAIDs, and hand therapy.  An MRI was consistent with mild medial epicondylitis and an EMG/NCS consistent with mild cubital tunnel syndrome.  She presents today for cubital tunnel release with or without anterior transposition.  She denies any changes to her medical condition and denies systemic symptoms.   Past Medical History:  Diagnosis Date   Acute appendicitis 09/14/2018   Allergy    Anemia    Anxiety    Asthma    BRCA negative 12/2014   Depression    Endometriosis    Esophagitis    Fibroid    GERD (gastroesophageal reflux disease)    Hiatal hernia    History of diverticulitis    IC (interstitial cystitis)    Osteoporosis 01/2019   2018 T score -2.6, 2020 T score -2.0 on Actonel   Schatzki's ring    Past Surgical History:  Procedure Laterality Date   CHOLECYSTECTOMY     HYSTEROSCOPY  12/2003   RESECTON OF SUBMUCOUS  MYOMA/DX SCOPE   LAPAROSCOPIC APPENDECTOMY N/A 09/14/2018   Procedure: APPENDECTOMY LAPAROSCOPIC;  Surgeon: Ileana Roup, MD;  Location: [facility];  Service: General;  Laterality: N/A;   LAPAROSCOPIC BILATERAL SALPINGO OOPHERECTOMY Bilateral 07/02/2014   Procedure: LAPAROSCOPIC BILATERAL SALPINGO OOPHORECTOMY;  Surgeon: Anastasio Auerbach, MD;  Location: [facility];  Service: Gynecology;  Laterality: Bilateral;   PELVIC LAPAROSCOPY  12/2003   DX SCOPE/RESECTION OF SUBMUCOUS MYOMA   TMJ ARTHROSCOPY  11-26-2001   VAGINAL HYSTERECTOMY  12/05   Menorrhagia/dysmenorrhea. Pathology showed leiomyoma/adenomyosis   Social History   Socioeconomic History   Marital status: Married    Spouse name: Not on file   Number of children: 2   Years of education: Not on file   Highest education  level: Not on file  Occupational History   Not on file  Tobacco Use   Smoking status: Never   Smokeless tobacco: Never  Vaping Use   Vaping Use: Never used  Substance and Sexual Activity   Alcohol use: Yes    Alcohol/week: 2.0 standard drinks of alcohol    Types: 2 Glasses of wine per week    Comment: week   Drug use: No   Sexual activity: Yes    Birth control/protection: Surgical    Comment: HYST-1st intercourse 61 yo-Fewer than 5 partners  Other Topics Concern   Not on file  Social History Narrative   Not on file   Social Determinants of Health   Financial Resource Strain: Not on file  Food Insecurity: Not on file  Transportation Needs: Not on file  Physical Activity: Not on file  Stress: Not on file  Social Connections: Not on file   Family History  Problem Relation Age of Onset   Uterine cancer Maternal Grandmother        dx in her 36s   Breast cancer Sister        paternal half sister dx in her early 48s   Cystic fibrosis Other        brother's granddauther   Emphysema Father    Early death Father    Breast cancer Sister        paternal half sister dx in her late 84s   Cystic fibrosis Other  brothers granddaughters   Depression Mother    Hyperlipidemia Mother    Hypertension Mother    Mental illness Mother    Miscarriages / [place] Mother    Diabetes Brother 28       HALF BROTHER/HALF BROTHER    Breast cancer Sister    GI problems Brother    Colon cancer Neg Hx    Esophageal cancer Neg Hx    Rectal cancer Neg Hx    Stomach cancer Neg Hx    Allergic rhinitis Neg Hx    Asthma Neg Hx    Eczema Neg Hx    Urticaria Neg Hx    - negative except otherwise stated in the family history section Allergies  Allergen Reactions   Honey Bee Treatment [Bee Venom] Anaphylaxis   Eggs Or Egg-Derived Products     Stomach ache   Methocarbamol    Peanut-Containing Drug Products Other (See Comments)    Allergy testing   Sesame Seed (Diagnostic) Other  (See Comments)    Allergy test   Soy Allergy Other (See Comments)    Allergy test   Wheat Bran     Intolerance    Prior to Admission medications   Medication Sig Start Date End Date Taking? Authorizing Provider  buPROPion (WELLBUTRIN XL) 150 MG 24 hr tablet Take 150 mg by mouth 2 (two) times daily. 10/23/20  Yes [provider]  Calcium Carbonate (CALCIUM 600 PO) Take 1 tablet by mouth daily.   Yes [provider]  Estradiol 10 MCG TABS vaginal tablet INSERT 1 TABLET VAGINALLY EVERY NIGHT AT BEDTIME FOR 1 WEEK THEN TWICE WEEKLY AT BEDTIME. 11/17/21  Yes Salvadore Dom, MD  LORazepam (ATIVAN) 0.5 MG tablet Take 0.5 mg by mouth daily as needed for sleep.    Yes [provider]  MAGNESIUM GLYCINATE PLUS PO Take 2 capsules by mouth at bedtime.   Yes [provider]  mometasone (NASONEX) 50 MCG/ACT nasal spray Place 2 sprays into the nose daily. 12/01/21  Yes Kozlow, Donnamarie Poag, MD  montelukast (SINGULAIR) 10 MG tablet Take 1 tablet (10 mg total) by mouth at bedtime. 12/01/21  Yes Kozlow, Donnamarie Poag, MD  Multiple Vitamin (MULTIVITAMIN) tablet Take 1 tablet by mouth 2 (two) times daily.   Yes [provider]  olopatadine (PATANOL) 0.1 % ophthalmic solution INSTILL 1 DROP IN BOTH EYES TWICE DAILY 11/25/19  Yes Kozlow, Donnamarie Poag, MD  Omega-3 Fatty Acids (OMEGAPURE 780 EC PO) Take 1 tablet by mouth daily.   Yes [provider]  albuterol (VENTOLIN HFA) 108 (90 Base) MCG/ACT inhaler Inhale 2 puffs into the lungs every 6 (six) hours as needed for wheezing or shortness of breath. 12/01/21   Kozlow, Donnamarie Poag, MD  EPINEPHrine 0.3 mg/0.3 mL IJ SOAJ injection Inject 0.3 mg into the muscle as needed for anaphylaxis. 12/01/21   Kozlow, Donnamarie Poag, MD   No results found. - Positive ROS: All other systems have been reviewed and were otherwise negative with the exception of those mentioned in the HPI and as above.  Physical Exam: General: No acute distress, resting  comfortably Cardiovascular: BUE warm and well perfused, normal rate Respiratory: Normal WOB on RA Skin: Warm and dry Neurologic: Sensation intact distally Psychiatric: Patient is at baseline mood and affect  Right Upper Extremity Somewhat limited by peripheral nerve block Questionable ulnar nerve instability with passive flexion/extension of elbow Hand warm and well perfused w/ BCR    Assessment: 61 yo F w/ right cubital tunnel  syndrome with questionable ulnar nerve instability  Plan: OR today for right cubital tunnel release with or without anterior transposition Reviewed risks of surgery including bleeding, infection, damage to neurovascular structures, persistent symptoms, scar or nerve sensitivity, need for additional procedures Discharge to home from [place], M.D. [facility] 11:52 AM

## 2022-06-01 NOTE — Transfer of Care (Signed)
Immediate Anesthesia Transfer of Care Note  Patient: [name]  Procedure(s) Performed: CUBITAL TUNNEL RELEASE WITH POSSIBLE ANTERIOR TRANSPOSITION (Right: Elbow)  Patient Location: PACU  Anesthesia Type:MAC and Regional  Level of Consciousness: awake, alert  and oriented  Airway & Oxygen Therapy: Patient Spontanous Breathing and Patient connected to face mask oxygen  Post-op Assessment: Report given to RN and Post -op Vital signs reviewed and stable  Post vital signs: Reviewed and stable  Last Vitals:  Vitals Value Taken Time  BP 117/60 06/01/22 1405  Temp    Pulse 77 06/01/22 1408  Resp 15 06/01/22 1408  SpO2 95 % 06/01/22 1408  Vitals shown include unvalidated device data.  Last Pain:  Vitals:   06/01/22 1024  TempSrc: Oral  PainSc: 0-No pain      Patients Stated Pain Goal: 4 (85/92/92 4462)  Complications: No notable events documented.

## 2022-06-02 ENCOUNTER — Encounter (HOSPITAL_BASED_OUTPATIENT_CLINIC_OR_DEPARTMENT_OTHER): Payer: Self-pay | Admitting: Orthopedic Surgery

## 2022-06-15 ENCOUNTER — Ambulatory Visit (INDEPENDENT_AMBULATORY_CARE_PROVIDER_SITE_OTHER): Payer: Self-pay | Admitting: *Deleted

## 2022-06-15 DIAGNOSIS — J309 Allergic rhinitis, unspecified: Secondary | ICD-10-CM | POA: Diagnosis not present

## 2022-06-21 ENCOUNTER — Ambulatory Visit (INDEPENDENT_AMBULATORY_CARE_PROVIDER_SITE_OTHER): Payer: No Typology Code available for payment source | Admitting: Nurse Practitioner

## 2022-06-21 ENCOUNTER — Encounter: Payer: Self-pay | Admitting: Nurse Practitioner

## 2022-06-21 VITALS — BP 110/70 | HR 71 | Temp 97.4°F | Ht 61.0 in | Wt 138.6 lb

## 2022-06-21 DIAGNOSIS — F325 Major depressive disorder, single episode, in full remission: Secondary | ICD-10-CM | POA: Diagnosis not present

## 2022-06-21 DIAGNOSIS — Z23 Encounter for immunization: Secondary | ICD-10-CM | POA: Diagnosis not present

## 2022-06-21 DIAGNOSIS — L821 Other seborrheic keratosis: Secondary | ICD-10-CM | POA: Insufficient documentation

## 2022-06-21 DIAGNOSIS — Z0000 Encounter for general adult medical examination without abnormal findings: Secondary | ICD-10-CM | POA: Diagnosis not present

## 2022-06-21 LAB — COMPREHENSIVE METABOLIC PANEL
ALT: 23 U/L (ref 0–35)
AST: 21 U/L (ref 0–37)
Albumin: 4.3 g/dL (ref 3.5–5.2)
Alkaline Phosphatase: 96 U/L (ref 39–117)
BUN: 18 mg/dL (ref 6–23)
CO2: 30 mEq/L (ref 19–32)
Calcium: 9.4 mg/dL (ref 8.4–10.5)
Chloride: 103 mEq/L (ref 96–112)
Creatinine, Ser: 0.74 mg/dL (ref 0.40–1.20)
GFR: 88.77 mL/min (ref >60.00)
Glucose, Bld: 82 mg/dL (ref 70–99)
Potassium: 4.2 mEq/L (ref 3.5–5.1)
Sodium: 140 mEq/L (ref 135–145)
Total Bilirubin: 0.4 mg/dL (ref 0.2–1.2)
Total Protein: 7 g/dL (ref 6.0–8.3)

## 2022-06-21 NOTE — Assessment & Plan Note (Signed)
Stable mood with wellbutrin BID and lorazepam prn managed by Eino Farber, PA with [facility]

## 2022-06-21 NOTE — Progress Notes (Signed)
Complete physical exam  Patient: Yolanda [name]   DOB: 1960/12/14   61 y.o. Female  MRN: [id number] Visit Date: 06/21/2022  Subjective:    Chief Complaint  Patient presents with   Annual Exam    CPE Pt advised  Requesting records for Pap Flu shot given today    Yolanda [name] is a 61 y.o. female who presents today for a complete physical exam. She reports consuming a general diet.  Walking 2x/week, 2-30mles each day, weight training with personal trainer  She generally feels well. She reports sleeping fairly well. She does not have additional problems to discuss today.  Vision:Yes Dental:Yes STD Screen:No  Most recent fall risk assessment:    06/16/2021    8:16 AM  Fall Risk   Falls in the past year? 0  Number falls in past yr: 0  Injury with Fall? 0  Risk for fall due to : No Fall Risks  Follow up Falls evaluation completed     Most recent depression screenings:    06/21/2022    9:18 AM 06/16/2021    9:42 AM  PHQ 2/9 Scores  PHQ - 2 Score 0 0  PHQ- 9 Score 1 1   HPI  Depression, major, single episode, complete remission (HCC) Stable mood with wellbutrin BID and lorazepam prn managed by JEino Farber PA with [facility]  Past Medical History:  Diagnosis Date   Acute appendicitis 09/14/2018   Allergy    Anemia    Anxiety    Asthma    BRCA negative 12/2014   Depression    Endometriosis    Esophagitis    Fibroid    GERD (gastroesophageal reflux disease)    Hiatal hernia    History of diverticulitis    IC (interstitial cystitis)    Osteoporosis 01/2019   2018 T score -2.6, 2020 T score -2.0 on Actonel   Pain in the coccyx 12/04/2018   Schatzki's ring    Past Surgical History:  Procedure Laterality Date   CHOLECYSTECTOMY     HYSTEROSCOPY  12/2003   RESECTON OF SUBMUCOUS  MYOMA/DX SCOPE   LAPAROSCOPIC APPENDECTOMY N/A 09/14/2018   Procedure: APPENDECTOMY LAPAROSCOPIC;  Surgeon: WIleana Roup MD;  [facility]  Service: General;   Laterality: N/A;   LAPAROSCOPIC BILATERAL SALPINGO OOPHERECTOMY Bilateral 07/02/2014   Procedure: LAPAROSCOPIC BILATERAL SALPINGO OOPHORECTOMY;  Surgeon: TAnastasio Auerbach MD;  [facility];  Service: Gynecology;  Laterality: Bilateral;   PELVIC LAPAROSCOPY  12/2003   DX SCOPE/RESECTION OF SUBMUCOUS MYOMA   TMJ ARTHROSCOPY  11-26-2001   ULNAR TUNNEL RELEASE Right 06/01/2022   Procedure: CUBITAL TUNNEL RELEASE WITH POSSIBLE ANTERIOR TRANSPOSITION;  Surgeon: BSherilyn Cooter MD;  [facility]  Service: Orthopedics;  Laterality: Right;  62   VAGINAL HYSTERECTOMY  12/05   Menorrhagia/dysmenorrhea. Pathology showed leiomyoma/adenomyosis   Social History   Socioeconomic History   Marital status: Married    Spouse name: Not on file   Number of children: 2   Years of education: Not on file   Highest education level: Not on file  Occupational History   Not on file  Tobacco Use   Smoking status: Never   Smokeless tobacco: Never  Vaping Use   Vaping Use: Never used  Substance and Sexual Activity   Alcohol use: Yes    Alcohol/week: 2.0 standard drinks of alcohol    Types: 2 Glasses of wine per week    Comment: week   Drug use:  No   Sexual activity: Yes    Birth control/protection: Surgical    Comment: HYST-1st intercourse 61 yo-Fewer than 5 partners  Other Topics Concern   Not on file  Social History Narrative   Not on file   Social Determinants of Health   Financial Resource Strain: Not on file  Food Insecurity: Not on file  Transportation Needs: Not on file  Physical Activity: Not on file  Stress: Not on file  Social Connections: Not on file  Intimate Partner Violence: Not on file   Family Status  Relation Name Status   Mother  Alive   Father  Deceased   Sister Billie half-sister Alive   Sister Bethena Roys half-sister Alive   Sister Massie Maroon half-sister Alive   Brother paternal half Oliver   Brother maternal half [place]    Daughter  Alive   Daughter  Alive   MGM  Deceased   MGF  Deceased   PGM  Deceased   PGF  Deceased   Other  Alive   Other mat niece Alive   Neg Hx  (Not Specified)   Family History  Problem Relation Age of Onset   Depression Mother    Hyperlipidemia Mother    Hypertension Mother    Mental illness Mother    Miscarriages / [place] Mother    Pulmonary fibrosis Mother        possible textile mil exposure   Emphysema Father    Early death Father    Breast cancer Sister        paternal half sister dx in her early 68s   Breast cancer Sister        paternal half sister dx in her late 44s   Breast cancer Sister    Diabetes Brother 44       HALF BROTHER/HALF BROTHER    GI problems Brother    Uterine cancer Maternal Grandmother        dx in her 53s   Cystic fibrosis Other        brother's granddauther   Cystic fibrosis Other        brothers granddaughters   Colon cancer Neg Hx    Esophageal cancer Neg Hx    Rectal cancer Neg Hx    Stomach cancer Neg Hx    Allergic rhinitis Neg Hx    Asthma Neg Hx    Eczema Neg Hx    Urticaria Neg Hx    Allergies  Allergen Reactions   Honey Bee Treatment [Bee Venom] Anaphylaxis   Eggs Or Egg-Derived Products     Stomach ache   Methocarbamol    Peanut-Containing Drug Products Other (See Comments)    Allergy testing   Sesame Seed (Diagnostic) Other (See Comments)    Allergy test   Soy Allergy Other (See Comments)    Allergy test   Wheat Bran     Intolerance     Patient Care Team: Jadriel Saxer, Charlene Brooke, NP as PCP - General (Internal Medicine) Salvadore Dom, MD as Consulting Physician (Obstetrics and Gynecology)   Medications: Outpatient Medications Prior to Visit  Medication Sig   albuterol (VENTOLIN HFA) 108 (90 Base) MCG/ACT inhaler Inhale 2 puffs into the lungs every 6 (six) hours as needed for wheezing or shortness of breath.   buPROPion (WELLBUTRIN XL) 150 MG 24 hr tablet Take 150 mg by mouth 2 (two) times daily.    Calcium Carbonate (CALCIUM 600 PO) Take 1 tablet by mouth daily.  EPINEPHrine 0.3 mg/0.3 mL IJ SOAJ injection Inject 0.3 mg into the muscle as needed for anaphylaxis.   Estradiol 10 MCG TABS vaginal tablet INSERT 1 TABLET VAGINALLY EVERY NIGHT AT BEDTIME FOR 1 WEEK THEN TWICE WEEKLY AT BEDTIME.   LORazepam (ATIVAN) 0.5 MG tablet Take 0.5 mg by mouth daily as needed for sleep.    MAGNESIUM GLYCINATE PLUS PO Take 2 capsules by mouth at bedtime.   mometasone (NASONEX) 50 MCG/ACT nasal spray Place 2 sprays into the nose daily.   montelukast (SINGULAIR) 10 MG tablet Take 1 tablet (10 mg total) by mouth at bedtime.   Multiple Vitamin (MULTIVITAMIN) tablet Take 1 tablet by mouth 2 (two) times daily.   olopatadine (PATANOL) 0.1 % ophthalmic solution INSTILL 1 DROP IN BOTH EYES TWICE DAILY   Omega-3 Fatty Acids (OMEGAPURE 780 EC PO) Take 1 tablet by mouth daily.   No facility-administered medications prior to visit.    Review of Systems  Constitutional:  Negative for fever.  HENT:  Negative for congestion and sore throat.   Eyes:        Negative for visual changes  Respiratory:  Negative for cough and shortness of breath.   Cardiovascular:  Negative for chest pain, palpitations and leg swelling.  Gastrointestinal:  Negative for blood in stool, constipation and diarrhea.  Genitourinary:  Negative for dysuria, frequency and urgency.  Musculoskeletal:  Negative for myalgias.  Skin:  Negative for rash.  Neurological:  Negative for dizziness and headaches.  Hematological:  Does not bruise/bleed easily.  Psychiatric/Behavioral:  Negative for suicidal ideas. The patient is not nervous/anxious.         Objective:  BP 110/70 (BP Location: Right Arm, Patient Position: Sitting, Cuff Size: Normal)   Pulse 71   Temp (!) 97.4 F (36.3 C) (Temporal)   Ht _0  (1.549 m)   Wt 138 lb 9.6 oz (62.9 kg)   SpO2 98%   BMI 26.19 kg/m       Physical Exam Vitals reviewed.  Constitutional:       General: She is not in acute distress.    Appearance: She is well-developed.  HENT:     Right Ear: Tympanic membrane, ear canal and external ear normal.     Left Ear: Tympanic membrane, ear canal and external ear normal.     Nose: Nose normal.  Eyes:     Extraocular Movements: Extraocular movements intact.     Conjunctiva/sclera: Conjunctivae normal.  Cardiovascular:     Rate and Rhythm: Normal rate and regular rhythm.     Pulses: Normal pulses.     Heart sounds: Normal heart sounds.  Pulmonary:     Effort: Pulmonary effort is normal. No respiratory distress.     Breath sounds: Normal breath sounds.  Chest:     Chest wall: No tenderness.  Abdominal:     General: Bowel sounds are normal.     Palpations: Abdomen is soft.  Genitourinary:    Comments: Breast and pelvic exam deferred to GYN Musculoskeletal:        General: Normal range of motion.     Cervical back: Normal range of motion and neck supple.     Right lower leg: No edema.     Left lower leg: No edema.  Skin:    General: Skin is warm and dry.  Neurological:     Mental Status: She is alert and oriented to person, place, and time.     Deep Tendon Reflexes: Reflexes are normal and  symmetric.  Psychiatric:        Mood and Affect: Mood normal.        Behavior: Behavior normal.        Thought Content: Thought content normal.     No results found for any visits on 06/21/22.    Assessment & Plan:    Routine Health Maintenance and Physical Exam  Immunization History  Administered Date(s) Administered   Influenza,inj,Quad PF,6+ Mos 07/11/2012, 08/16/2013, 06/11/2014, 07/22/2015, 07/25/2016, 06/28/2019, 06/15/2020, 06/16/2021, 06/21/2022   PFIZER(Purple Top)SARS-COV-2 Vaccination 11/28/2019, 12/23/2019, 07/06/2020   Tdap 03/20/2018   Zoster Recombinat (Shingrix) 06/16/2021, 09/16/2021    Health Maintenance  Topic Date Due   PAP SMEAR-Modifier  08/17/2020   COVID-19 Vaccine (4 - Pfizer risk series) 07/07/2022  (Originally 08/31/2020)   COLONOSCOPY (Pts 45-79yr Insurance coverage will need to be confirmed)  03/21/2023   MAMMOGRAM  01/05/2024   TETANUS/TDAP  03/20/2028   INFLUENZA VACCINE  Completed   Hepatitis C Screening  Completed   HIV Screening  Completed   Zoster Vaccines- Shingrix  Completed   HPV VACCINES  Aged Out   Discussed health benefits of physical activity, and encouraged her to engage in regular exercise appropriate for her age and condition.  Problem List Items Addressed This Visit       Other   Depression, major, single episode, complete remission ([place]    Stable mood with wellbutrin BID and lorazepam prn managed by JEino Farber PA with [facility]      Other Visit Diagnoses     Preventative health care    -  Primary   Relevant Orders   Comprehensive metabolic panel   Need for immunization against influenza       Relevant Orders   Flu Vaccine QUAD 6+ mos PF IM (Fluarix Quad PF) (Completed)      Return in about 1 year (around 06/22/2023) for CPE (fasting).     CWilfred Lacy NP

## 2022-06-21 NOTE — Assessment & Plan Note (Signed)
>>  ASSESSMENT AND PLAN FOR DEPRESSION, MAJOR, SINGLE EPISODE, COMPLETE REMISSION (HCC) WRITTEN ON 06/21/2022  9:47 AM BY Nethan Caudillo LUM, NP  Stable mood with wellbutrin BID and lorazepam prn managed by Tamela Oddi, PA with [facility]

## 2022-06-21 NOTE — Patient Instructions (Addendum)
Go to lab  Preventive Care 40-59 Years Old, Female Preventive care refers to lifestyle choices and visits with your health care provider that can promote health and wellness. Preventive care visits are also called wellness exams. What can I expect for my preventive care visit? Counseling Your health care provider may ask you questions about your: Medical history, including: Past medical problems. Family medical history. Pregnancy history. Current health, including: Menstrual cycle. Method of birth control. Emotional well-being. Home life and relationship well-being. Sexual activity and sexual health. Lifestyle, including: Alcohol, nicotine or tobacco, and drug use. Access to firearms. Diet, exercise, and sleep habits. Work and work environment. Sunscreen use. Safety issues such as seatbelt and bike helmet use. Physical exam Your health care provider will check your: Height and weight. These may be used to calculate your BMI (body mass index). BMI is a measurement that tells if you are at a healthy weight. Waist circumference. This measures the distance around your waistline. This measurement also tells if you are at a healthy weight and may help predict your risk of certain diseases, such as type 2 diabetes and high blood pressure. Heart rate and blood pressure. Body temperature. Skin for abnormal spots. What immunizations do I need?  Vaccines are usually given at various ages, according to a schedule. Your health care provider will recommend vaccines for you based on your age, medical history, and lifestyle or other factors, such as travel or where you work. What tests do I need? Screening Your health care provider may recommend screening tests for certain conditions. This may include: Lipid and cholesterol levels. Diabetes screening. This is done by checking your blood sugar (glucose) after you have not eaten for a while (fasting). Pelvic exam and Pap test. Hepatitis B  test. Hepatitis C test. HIV (human immunodeficiency virus) test. STI (sexually transmitted infection) testing, if you are at risk. Lung cancer screening. Colorectal cancer screening. Mammogram. Talk with your health care provider about when you should start having regular mammograms. This may depend on whether you have a family history of breast cancer. BRCA-related cancer screening. This may be done if you have a family history of breast, ovarian, tubal, or peritoneal cancers. Bone density scan. This is done to screen for osteoporosis. Talk with your health care provider about your test results, treatment options, and if necessary, the need for more tests. Follow these instructions at home: Eating and drinking  Eat a diet that includes fresh fruits and vegetables, whole grains, lean protein, and low-fat dairy products. Take vitamin and mineral supplements as recommended by your health care provider. Do not drink alcohol if: Your health care provider tells you not to drink. You are pregnant, may be pregnant, or are planning to become pregnant. If you drink alcohol: Limit how much you have to 0-1 drink a day. Know how much alcohol is in your drink. In the [place] one drink equals one 12 oz bottle of beer (355 mL), one 5 oz glass of wine (148 mL), or one 1 oz glass of hard liquor (44 mL). Lifestyle Brush your teeth every morning and night with fluoride toothpaste. Floss one time each day. Exercise for at least 30 minutes 5 or more days each week. Do not use any products that contain nicotine or tobacco. These products include cigarettes, chewing tobacco, and vaping devices, such as e-cigarettes. If you need help quitting, ask your health care provider. Do not use drugs. If you are sexually active, practice safe sex. Use a condom or other   form of protection to prevent STIs. If you do not wish to become pregnant, use a form of birth control. If you plan to become pregnant, see your health care  provider for a prepregnancy visit. Take aspirin only as told by your health care provider. Make sure that you understand how much to take and what form to take. Work with your health care provider to find out whether it is safe and beneficial for you to take aspirin daily. Find healthy ways to manage stress, such as: Meditation, yoga, or listening to music. Journaling. Talking to a trusted person. Spending time with friends and family. Minimize exposure to UV radiation to reduce your risk of skin cancer. Safety Always wear your seat belt while driving or riding in a vehicle. Do not drive: If you have been drinking alcohol. Do not ride with someone who has been drinking. When you are tired or distracted. While texting. If you have been using any mind-altering substances or drugs. Wear a helmet and other protective equipment during sports activities. If you have firearms in your house, make sure you follow all gun safety procedures. Seek help if you have been physically or sexually abused. What's next? Visit your health care provider once a year for an annual wellness visit. Ask your health care provider how often you should have your eyes and teeth checked. Stay up to date on all vaccines. This information is not intended to replace advice given to you by your health care provider. Make sure you discuss any questions you have with your health care provider. Document Revised: 02/03/2021 Document Reviewed: 02/03/2021 Elsevier Patient Education  [place].

## 2022-06-28 ENCOUNTER — Encounter: Payer: Self-pay | Admitting: Nurse Practitioner

## 2022-06-28 ENCOUNTER — Other Ambulatory Visit: Payer: Self-pay | Admitting: Allergy and Immunology

## 2022-07-09 ENCOUNTER — Ambulatory Visit
Admission: EM | Admit: 2022-07-09 | Discharge: 2022-07-09 | Disposition: A | Discharge status: Home / Self Care | Payer: No Typology Code available for payment source | Attending: Emergency Medicine | Admitting: Emergency Medicine

## 2022-07-09 DIAGNOSIS — B349 Viral infection, unspecified: Secondary | ICD-10-CM | POA: Diagnosis not present

## 2022-07-09 DIAGNOSIS — J029 Acute pharyngitis, unspecified: Secondary | ICD-10-CM | POA: Diagnosis present

## 2022-07-09 DIAGNOSIS — Z20822 Contact with and (suspected) exposure to covid-19: Secondary | ICD-10-CM | POA: Diagnosis not present

## 2022-07-09 LAB — POCT RAPID STREP A (OFFICE): Rapid Strep A Screen: NEGATIVE

## 2022-07-09 NOTE — ED Provider Notes (Signed)
[facility]    CSN: [id number] Arrival date & time: 07/09/22  0800    HISTORY   Chief Complaint  Patient presents with   Sore Throat   HPI Yolanda [name] is a pleasant, 61 y.o. female who presents to urgent care today. Patient complains of a 2-day history of sore throat.  Patient reports pain with swallowing, nasal congestion and a mild cough.  Patient states she had a negative home COVID test yesterday.  Patient states she has been alternating Tylenol and Aleve with some relief of her symptoms.  Patient denies headache, nausea, vomiting, diarrhea, known sick contacts.  The history is provided by the patient.   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/21/2022   Numbness and tingling in right hand 04/04/2022   Chronic pain of right elbow 10/22/2021   Medial epicondylitis of elbow, left 06/15/2020   Renal cyst, right 09/19/2019   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/18/2019   Lumbar radiculopathy 09/09/2019   Closed fracture of coccyx ([place]) 12/04/2018   Food allergy 03/20/2018   Genetic testing 12/23/2014   Family history of breast cancer    Family history of uterine cancer    Osteoporosis 10/04/2014   Pancreatic duct dilated 05/10/2012   IBS 08/01/2008   UNS ADVRS EFF UNS RX MEDICINAL&BIOLOGICAL SBSTNC 01/21/2008   Depression, major, single episode, complete remission ([place]) 01/18/2008   RHINITIS, CHRONIC 01/18/2008   Past Surgical  History:  Procedure Laterality Date   CHOLECYSTECTOMY     HYSTEROSCOPY  12/2003   RESECTON OF SUBMUCOUS  MYOMA/DX SCOPE   LAPAROSCOPIC APPENDECTOMY N/A 09/14/2018   Procedure: APPENDECTOMY LAPAROSCOPIC;  Surgeon: Ileana Roup, MD;  Location: [facility];  Service: General;  Laterality: N/A;   LAPAROSCOPIC BILATERAL SALPINGO OOPHERECTOMY Bilateral 07/02/2014   Procedure: LAPAROSCOPIC BILATERAL SALPINGO OOPHORECTOMY;  Surgeon: Anastasio Auerbach, MD;  Location: [facility];  Service: Gynecology;  Laterality: Bilateral;   PELVIC LAPAROSCOPY  12/2003   DX SCOPE/RESECTION OF SUBMUCOUS MYOMA   TMJ ARTHROSCOPY  11-26-2001   ULNAR TUNNEL RELEASE Right 06/01/2022   Procedure: CUBITAL TUNNEL RELEASE WITH POSSIBLE ANTERIOR TRANSPOSITION;  Surgeon: Sherilyn Cooter, MD;  Location: [facility];  Service: Orthopedics;  Laterality: Right;  62   VAGINAL HYSTERECTOMY  12/05   Menorrhagia/dysmenorrhea. Pathology showed leiomyoma/adenomyosis   OB History     Gravida  2   Para  2   Term  2   Preterm      AB      Living  2      SAB      IAB      Ectopic      Multiple      Live Births  2          Home Medications    Prior to Admission medications  Medication Sig Start Date End Date Taking? Authorizing Provider  albuterol (VENTOLIN HFA) 108 (90 Base) MCG/ACT inhaler Inhale 2 puffs into the lungs every 6 (six) hours as needed for wheezing or shortness of breath. 12/01/21   Kozlow, Donnamarie Poag, MD  buPROPion (WELLBUTRIN XL) 150 MG 24 hr tablet Take 150 mg by mouth 2 (two) times daily. 10/23/20   [provider]  Calcium Carbonate (CALCIUM 600 PO) Take 1 tablet by mouth daily.    [provider]  EPINEPHrine 0.3 mg/0.3 mL IJ SOAJ injection Inject 0.3 mg into the muscle as needed for anaphylaxis. 12/01/21   Kozlow, Donnamarie Poag, MD  Estradiol 10 MCG TABS vaginal tablet INSERT 1 TABLET VAGINALLY EVERY NIGHT AT BEDTIME FOR 1 WEEK THEN TWICE WEEKLY AT BEDTIME. 11/17/21    Salvadore Dom, MD  LORazepam (ATIVAN) 0.5 MG tablet Take 0.5 mg by mouth daily as needed for sleep.     [provider]  MAGNESIUM GLYCINATE PLUS PO Take 2 capsules by mouth at bedtime.    [provider]  mometasone (NASONEX) 50 MCG/ACT nasal spray Place 2 sprays into the nose daily. 12/01/21   Kozlow, Donnamarie Poag, MD  montelukast (SINGULAIR) 10 MG tablet TAKE 1 TABLET(10 MG) BY MOUTH AT BEDTIME 06/28/22   Kozlow, Donnamarie Poag, MD  Multiple Vitamin (MULTIVITAMIN) tablet Take 1 tablet by mouth 2 (two) times daily.    [provider]  olopatadine (PATANOL) 0.1 % ophthalmic solution INSTILL 1 DROP IN BOTH EYES TWICE DAILY 11/25/19   Kozlow, Donnamarie Poag, MD  Omega-3 Fatty Acids (OMEGAPURE 780 EC PO) Take 1 tablet by mouth daily.    [provider]    Family History Family History  Problem Relation Age of Onset   Depression Mother    Hyperlipidemia Mother    Hypertension Mother    Mental illness Mother    Miscarriages / [place] Mother    Pulmonary fibrosis Mother        possible textile mil exposure   Emphysema Father    Early death Father    Breast cancer Sister        paternal half sister dx in her early 13s   Breast cancer Sister        paternal half sister dx in her late 58s   Breast cancer Sister    Diabetes Brother 53       HALF BROTHER/HALF BROTHER    GI problems Brother    Uterine cancer Maternal Grandmother        dx in her 39s   Cystic fibrosis Other        brother's granddauther   Cystic fibrosis Other        brothers granddaughters   Colon cancer Neg Hx    Esophageal cancer Neg Hx    Rectal cancer Neg Hx    Stomach cancer Neg Hx    Allergic rhinitis Neg Hx    Asthma Neg Hx    Eczema Neg Hx    Urticaria Neg Hx    Social History Social History   Tobacco Use   Smoking status: Never   Smokeless tobacco: Never  Vaping Use   Vaping Use: Never used  Substance Use Topics   Alcohol use: Yes    Alcohol/week: 2.0 standard drinks of  alcohol    Types: 2 Glasses of wine per week    Comment: week   Drug use: No   Allergies   Honey bee treatment [bee venom], Eggs or egg-derived  products, Methocarbamol, Peanut-containing drug products, Sesame seed (diagnostic), Soy allergy, and Wheat bran  Review of Systems Review of Systems Pertinent findings revealed after performing a 14 point review of systems has been noted in the history of present illness.  Physical Exam Triage Vital Signs ED Triage Vitals  Enc Vitals Group     BP 06/18/21 0827 (!) 147/82     Pulse Rate 06/18/21 0827 72     Resp 06/18/21 0827 18     Temp 06/18/21 0827 98.3 F (36.8 C)     Temp Source 06/18/21 0827 Oral     SpO2 06/18/21 0827 98 %     Weight --      Height --      Head Circumference --      Peak Flow --      Pain Score 06/18/21 0826 5     Pain Loc --      Pain Edu? --      Excl. in [place]? --   No data found.  Updated Vital Signs BP 108/71 (BP Location: Left Arm)   Pulse 86   Temp 98.1 F (36.7 C) (Oral)   Resp 16   SpO2 97%   Physical Exam Vitals and nursing note reviewed.  Constitutional:      General: She is not in acute distress.    Appearance: Normal appearance. She is not ill-appearing.  HENT:     Head: Normocephalic and atraumatic.     Salivary Glands: Right salivary gland is not diffusely enlarged or tender. Left salivary gland is not diffusely enlarged or tender.     Right Ear: Tympanic membrane, ear canal and external ear normal. No drainage. No middle ear effusion. There is no impacted cerumen. Tympanic membrane is not erythematous or bulging.     Left Ear: Tympanic membrane, ear canal and external ear normal. No drainage.  No middle ear effusion. There is no impacted cerumen. Tympanic membrane is not erythematous or bulging.     Nose: Nose normal. No nasal deformity, septal deviation, mucosal edema, congestion or rhinorrhea.     Right Turbinates: Not enlarged, swollen or pale.     Left Turbinates: Not enlarged,  swollen or pale.     Right Sinus: No maxillary sinus tenderness or frontal sinus tenderness.     Left Sinus: No maxillary sinus tenderness or frontal sinus tenderness.     Mouth/Throat:     Lips: Pink. No lesions.     Mouth: Mucous membranes are moist. No oral lesions.     Pharynx: Oropharynx is clear. Uvula midline. No posterior oropharyngeal erythema or uvula swelling.     Tonsils: No tonsillar exudate. 0 on the right. 0 on the left.  Eyes:     General: Lids are normal.        Right eye: No discharge.        Left eye: No discharge.     Extraocular Movements: Extraocular movements intact.     Conjunctiva/sclera: Conjunctivae normal.     Right eye: Right conjunctiva is not injected.     Left eye: Left conjunctiva is not injected.  Neck:     Trachea: Trachea and phonation normal.  Cardiovascular:     Rate and Rhythm: Normal rate and regular rhythm.     Pulses: Normal pulses.     Heart sounds: Normal heart sounds. No murmur heard.    No friction rub. No gallop.  Pulmonary:     Effort: Pulmonary effort is normal. No accessory muscle  usage, prolonged expiration or respiratory distress.     Breath sounds: Normal breath sounds. No stridor, decreased air movement or transmitted upper airway sounds. No decreased breath sounds, wheezing, rhonchi or rales.  Chest:     Chest wall: No tenderness.  Musculoskeletal:        General: Normal range of motion.     Cervical back: Normal range of motion and neck supple. Normal range of motion.  Lymphadenopathy:     Cervical: No cervical adenopathy.  Skin:    General: Skin is warm and dry.     Findings: No erythema or rash.  Neurological:     General: No focal deficit present.     Mental Status: She is alert and oriented to person, place, and time.  Psychiatric:        Mood and Affect: Mood normal.        Behavior: Behavior normal.     Visual Acuity Right Eye Distance:   Left Eye Distance:   Bilateral Distance:    Right Eye Near:   Left  Eye Near:    Bilateral Near:     [facility] Couse / Diagnostics / Procedures:     Radiology No results found.  Procedures Procedures (including critical care time) EKG  Pending results:  Labs Reviewed  CULTURE, GROUP A STREP ([place])  SARS CORONAVIRUS 2 (TAT 6-24 HRS)  POCT RAPID STREP A (OFFICE)    Medications Ordered in UC: Medications - No data to display  UC Diagnoses / Final Clinical Impressions(s)   I have reviewed the triage vital signs and the nursing notes.  Pertinent labs & imaging results that were available during my care of the patient were reviewed by me and considered in my medical decision making (see chart for details).    Final diagnoses:  Acute pharyngitis, unspecified etiology  Viral illness   Rapid strep test is negative throat culture is pending, will treat with antibiotics as needed.  COVID-19 test pending, patient would not qualify for antivirals if positive.  Conservative care recommended.  Return precautions advised.  ED Prescriptions   None    PDMP not reviewed this encounter.  Disposition Upon Discharge:  Condition: stable for discharge home Home: take medications as prescribed; routine discharge instructions as discussed; follow up as advised.  Patient presented with an acute illness with associated systemic symptoms and significant discomfort requiring urgent management. In my opinion, this is a condition that a prudent lay person (someone who possesses an average knowledge of health and medicine) may potentially expect to result in complications if not addressed urgently such as respiratory distress, impairment of bodily function or dysfunction of bodily organs.   Routine symptom specific, illness specific and/or disease specific instructions were discussed with the patient and/or caregiver at length.   As such, the patient has been evaluated and assessed, work-up was performed and treatment was provided in alignment with urgent care protocols and  evidence based medicine.  Patient/parent/caregiver has been advised that the patient may require follow up for further testing and treatment if the symptoms continue in spite of treatment, as clinically indicated and appropriate.  If the patient was tested for COVID-19, Influenza and/or RSV, then the patient/parent/guardian was advised to isolate at home pending the results of his/her diagnostic coronavirus test and potentially longer if they're positive. I have also advised pt that if his/her COVID-19 test returns positive, it's recommended to self-isolate for at least 10 days after symptoms first appeared AND until fever-free for 24 hours without  fever reducer AND other symptoms have improved or resolved. Discussed self-isolation recommendations as well as instructions for household member/close contacts as per the [facility] DHHS, and also gave patient the [place] packet with this information.  Patient/parent/caregiver has been advised to return to the [facility] or PCP in 3-5 days if no better; to PCP or the Emergency Department if new signs and symptoms develop, or if the current signs or symptoms continue to change or worsen for further workup, evaluation and treatment as clinically indicated and appropriate  The patient will follow up with their current PCP if and as advised. If the patient does not currently have a PCP we will assist them in obtaining one.   The patient may need specialty follow up if the symptoms continue, in spite of conservative treatment and management, for further workup, evaluation, consultation and treatment as clinically indicated and appropriate.  Patient/parent/caregiver verbalized understanding and agreement of plan as discussed.  All questions were addressed during visit.  Please see discharge instructions below for further details of plan.  Discharge Instructions:   Discharge Instructions      You received a COVID-19 PCR test today.  The result of your COVID-19 test  will be posted to your MyChart once it is complete, typically this takes 24 to 36 hours.   If your COVID-19 PCR test is positive, you will be contacted by phone.  Because you do not have a history of being immune compromised, you are currently vaccinated for COVID-19, you are under the age of 60, you do not have a risk of severe disease due to COVID-19, antiviral treatment is not indicated.  If your COVID-19 PCR test is negative, please consider retesting in the next 2 to 3 days, particularly if you are not feeling any better.  You are welcome to return here to urgent care to have it done or you can take a home COVID-19 test.    If both your COVID-19 tests are negative, then you can safely assume that your illness is due to one of the many less serious illnesses circulating in our community right now.  Conservative care is recommended with rest, drinking plenty of clear fluids, eating only when hungry, taking supportive medications for your symptoms and avoiding being around other people.    Please remain at home until you are fever free for 24 hours without the use of antifever medications.               This office note has been dictated using Museum/gallery curator.  Unfortunately, this method of dictation can sometimes lead to typographical or grammatical errors.  I apologize for your inconvenience in advance if this occurs.  Please do not hesitate to reach out to me if clarification is needed.      Lynden Oxford Scales, [place] 07/09/22 [phone]

## 2022-07-09 NOTE — Discharge Instructions (Addendum)
You received a COVID-19 PCR test today.  The result of your COVID-19 test will be posted to your MyChart once it is complete, typically this takes 24 to 36 hours.   If your COVID-19 PCR test is positive, you will be contacted by phone.  Because you do not have a history of being immune compromised, you are currently vaccinated for COVID-19, you are under the age of 2, you do not have a risk of severe disease due to COVID-19, antiviral treatment is not indicated.  If your COVID-19 PCR test is negative, please consider retesting in the next 2 to 3 days, particularly if you are not feeling any better.  You are welcome to return here to urgent care to have it done or you can take a home COVID-19 test.    If both your COVID-19 tests are negative, then you can safely assume that your illness is due to one of the many less serious illnesses circulating in our community right now.  Conservative care is recommended with rest, drinking plenty of clear fluids, eating only when hungry, taking supportive medications for your symptoms and avoiding being around other people.    Please remain at home until you are fever free for 24 hours without the use of antifever medications.

## 2022-07-09 NOTE — ED Triage Notes (Signed)
Pt resents with sore throat with onset Thursday. Pt reports pain with swallowing, slight cough, and nasal congestion.  Reports home covid test was negative yesterday.   Reports she has been alternating tylenol and aleve with some relief.

## 2022-07-10 LAB — SARS CORONAVIRUS 2 (TAT 6-24 HRS): SARS Coronavirus 2: NEGATIVE

## 2022-07-12 ENCOUNTER — Ambulatory Visit (INDEPENDENT_AMBULATORY_CARE_PROVIDER_SITE_OTHER): Payer: No Typology Code available for payment source

## 2022-07-12 DIAGNOSIS — J309 Allergic rhinitis, unspecified: Secondary | ICD-10-CM | POA: Diagnosis not present

## 2022-07-12 LAB — CULTURE, GROUP A STREP (THRC)

## 2022-07-27 DIAGNOSIS — J302 Other seasonal allergic rhinitis: Secondary | ICD-10-CM

## 2022-07-27 NOTE — Progress Notes (Signed)
VIALS EXP 07-28-23 

## 2022-08-08 ENCOUNTER — Ambulatory Visit (INDEPENDENT_AMBULATORY_CARE_PROVIDER_SITE_OTHER): Payer: No Typology Code available for payment source

## 2022-08-08 DIAGNOSIS — J309 Allergic rhinitis, unspecified: Secondary | ICD-10-CM | POA: Diagnosis not present

## 2022-09-05 ENCOUNTER — Ambulatory Visit (INDEPENDENT_AMBULATORY_CARE_PROVIDER_SITE_OTHER): Payer: No Typology Code available for payment source

## 2022-09-05 DIAGNOSIS — J309 Allergic rhinitis, unspecified: Secondary | ICD-10-CM | POA: Diagnosis not present

## 2022-09-26 DIAGNOSIS — F419 Anxiety disorder, unspecified: Secondary | ICD-10-CM | POA: Insufficient documentation

## 2022-09-26 DIAGNOSIS — F330 Major depressive disorder, recurrent, mild: Secondary | ICD-10-CM | POA: Insufficient documentation

## 2022-10-03 ENCOUNTER — Ambulatory Visit (INDEPENDENT_AMBULATORY_CARE_PROVIDER_SITE_OTHER): Payer: No Typology Code available for payment source | Admitting: *Deleted

## 2022-10-03 DIAGNOSIS — J309 Allergic rhinitis, unspecified: Secondary | ICD-10-CM

## 2022-10-10 ENCOUNTER — Ambulatory Visit (INDEPENDENT_AMBULATORY_CARE_PROVIDER_SITE_OTHER): Payer: No Typology Code available for payment source

## 2022-10-10 DIAGNOSIS — J309 Allergic rhinitis, unspecified: Secondary | ICD-10-CM

## 2022-10-18 ENCOUNTER — Ambulatory Visit (INDEPENDENT_AMBULATORY_CARE_PROVIDER_SITE_OTHER): Payer: No Typology Code available for payment source

## 2022-10-18 DIAGNOSIS — J309 Allergic rhinitis, unspecified: Secondary | ICD-10-CM

## 2022-10-23 ENCOUNTER — Other Ambulatory Visit: Payer: Self-pay | Admitting: Obstetrics and Gynecology

## 2022-10-23 DIAGNOSIS — N952 Postmenopausal atrophic vaginitis: Secondary | ICD-10-CM

## 2022-10-24 ENCOUNTER — Ambulatory Visit (INDEPENDENT_AMBULATORY_CARE_PROVIDER_SITE_OTHER): Payer: No Typology Code available for payment source | Admitting: *Deleted

## 2022-10-24 DIAGNOSIS — J309 Allergic rhinitis, unspecified: Secondary | ICD-10-CM | POA: Diagnosis not present

## 2022-10-24 NOTE — Telephone Encounter (Signed)
AEX 11/17/21/ Scheduled 11/24/22.  MMG 01/03/2022-neg.

## 2022-10-31 ENCOUNTER — Ambulatory Visit (INDEPENDENT_AMBULATORY_CARE_PROVIDER_SITE_OTHER): Payer: No Typology Code available for payment source | Admitting: *Deleted

## 2022-10-31 DIAGNOSIS — J309 Allergic rhinitis, unspecified: Secondary | ICD-10-CM

## 2022-11-16 NOTE — Progress Notes (Signed)
62 y.o. G27P2002 Married White or Caucasian Hispanic or Latino female here for annual exam.  H/O hysterectomy. Uses vaginal estrogen, no dyspareunia.   See genetics note, risk of breast cancer is estimated at 8-10%.   She has mild mixed incontinence. She leaks small amounts daily.   No bowel issues  No LMP recorded. Patient has had a hysterectomy.          Sexually active: Yes.    The current method of family planning is status post hysterectomy.    Exercising: Yes.    Gym/ health club routine includes cardio, light weights, and walking on track . Tennis and walking Golf  Smoker:  no  Health Maintenance: Pap: 08/17/17 Neg             06/24/14 Neg History of abnormal Pap:  no MMG:  01/03/22 Bi-rads 1 neg  BMD:   02/16/21 osteopenia, t score -1.9 f/u 2 years (improved). She was on Actonel from 5/18 until 3/23. Colonoscopy: 03/20/13 f/u 10 years  TDaP:  03/20/18  Gardasil: n/a   reports that she has never smoked. She has never used smokeless tobacco. She reports current alcohol use of about 2.0 standard drinks of alcohol per week. She reports that she does not use drugs. Homemaker. 2 daughters, 1 grandson (62.44 years old). Another grandson is due in May.   Past Medical History:  Diagnosis Date   Acute appendicitis 09/14/2018   Allergy    Anemia    Anxiety    Asthma    BRCA negative 12/2014   Depression    Endometriosis    Esophagitis    Fibroid    GERD (gastroesophageal reflux disease)    Hiatal hernia    History of diverticulitis    IC (interstitial cystitis)    Osteoporosis 01/2019   2018 T score -2.6, 2020 T score -2.0 on Actonel   Pain in the coccyx 12/04/2018   Schatzki's ring     Past Surgical History:  Procedure Laterality Date   CHOLECYSTECTOMY     HYSTEROSCOPY  12/2003   RESECTON OF SUBMUCOUS  MYOMA/DX SCOPE   LAPAROSCOPIC APPENDECTOMY N/A 09/14/2018   Procedure: APPENDECTOMY LAPAROSCOPIC;  Surgeon: Ileana Roup, MD;  Location: [facility];  Service: General;   Laterality: N/A;   LAPAROSCOPIC BILATERAL SALPINGO OOPHERECTOMY Bilateral 07/02/2014   Procedure: LAPAROSCOPIC BILATERAL SALPINGO OOPHORECTOMY;  Surgeon: Anastasio Auerbach, MD;  Location: [facility];  Service: Gynecology;  Laterality: Bilateral;   PELVIC LAPAROSCOPY  12/2003   DX SCOPE/RESECTION OF SUBMUCOUS MYOMA   TMJ ARTHROSCOPY  11-26-2001   ULNAR TUNNEL RELEASE Right 06/01/2022   Procedure: CUBITAL TUNNEL RELEASE WITH POSSIBLE ANTERIOR TRANSPOSITION;  Surgeon: Sherilyn Cooter, MD;  Location: [facility];  Service: Orthopedics;  Laterality: Right;  62   VAGINAL HYSTERECTOMY  12/05   Menorrhagia/dysmenorrhea. Pathology showed leiomyoma/adenomyosis    Current Outpatient Medications  Medication Sig Dispense Refill   albuterol (VENTOLIN HFA) 108 (90 Base) MCG/ACT inhaler Inhale 2 puffs into the lungs every 6 (six) hours as needed for wheezing or shortness of breath. 18 g 2   buPROPion (WELLBUTRIN XL) 150 MG 24 hr tablet Take 150 mg by mouth 2 (two) times daily.     Calcium Carbonate (CALCIUM 600 PO) Take 1 tablet by mouth daily.     EPINEPHrine 0.3 mg/0.3 mL IJ SOAJ injection Inject 0.3 mg into the muscle as needed for anaphylaxis. 2 each 2   Estradiol (VAGIFEM) 10 MCG TABS vaginal tablet INSERT 1 TABLET VAGINALLY  EVERY NIGHT AT BEDTIME FOR 1 WEEK, THEN TWICE WEEKLY AT BEDTIME 24 tablet 0   LORazepam (ATIVAN) 0.5 MG tablet Take 0.5 mg by mouth daily as needed for sleep.      MAGNESIUM GLYCINATE PLUS PO Take 2 capsules by mouth at bedtime.     mometasone (NASONEX) 50 MCG/ACT nasal spray Place 2 sprays into the nose daily. 17 g 5   montelukast (SINGULAIR) 10 MG tablet TAKE 1 TABLET(10 MG) BY MOUTH AT BEDTIME 90 tablet 1   Multiple Vitamin (MULTIVITAMIN) tablet Take 1 tablet by mouth 2 (two) times daily.     olopatadine (PATANOL) 0.1 % ophthalmic solution INSTILL 1 DROP IN BOTH EYES TWICE DAILY 5 mL 0   Omega-3 Fatty Acids (OMEGAPURE 780 EC PO) Take 1 tablet by mouth daily.      No current facility-administered medications for this visit.    Family History  Problem Relation Age of Onset   Depression Mother    Hyperlipidemia Mother    Hypertension Mother    Mental illness Mother    Miscarriages / [place] Mother    Pulmonary fibrosis Mother        possible textile mil exposure   Emphysema Father    Early death Father    Breast cancer Sister        paternal half sister dx in her early 75s   Breast cancer Sister        paternal half sister dx in her late 49s   Breast cancer Sister    Diabetes Brother 69       HALF BROTHER/HALF BROTHER    GI problems Brother    Uterine cancer Maternal Grandmother        dx in her 73s   Cystic fibrosis Other        brother's granddauther   Cystic fibrosis Other        brothers granddaughters   Colon cancer Neg Hx    Esophageal cancer Neg Hx    Rectal cancer Neg Hx    Stomach cancer Neg Hx    Allergic rhinitis Neg Hx    Asthma Neg Hx    Eczema Neg Hx    Urticaria Neg Hx     Review of Systems  All other systems reviewed and are negative.   Exam:   There were no vitals taken for this visit.  Weight change: @WEIGHTCHANGE @ Height:      Ht Readings from Last 3 Encounters:  06/21/22 5\' 1"  (1.549 m)  06/01/22 5\' 1"  (1.549 m)  11/30/21 5' 0.5" (1.537 m)    General appearance: alert, cooperative and appears stated age Head: Normocephalic, without obvious abnormality, atraumatic Neck: no adenopathy, supple, symmetrical, trachea midline and thyroid normal to inspection and palpation Lungs: clear to auscultation bilaterally Cardiovascular: regular rate and rhythm Breasts: normal appearance, no masses or tenderness Abdomen: soft, non-tender; non distended,  no masses,  no organomegaly Extremities: extremities normal, atraumatic, no cyanosis or edema Skin: Skin color, texture, turgor normal. No rashes or lesions Lymph nodes: Cervical, supraclavicular, and axillary nodes normal. No abnormal inguinal nodes  palpated Neurologic: Grossly normal   Pelvic: External genitalia:  no lesions              Urethra:  normal appearing urethra with no masses, tenderness or lesions              Bartholins and Skenes: normal  Vagina: normal appearing vagina with normal color and discharge, no lesions              Cervix: absent               Bimanual Exam:  Uterus:  uterus absent              Adnexa: no mass, fullness, tenderness               Rectovaginal: Confirms               Anus:  normal sphincter tone, no lesions  Gae Dry, CMA chaperoned for the exam.  1. Well woman exam Discussed breast self exam Discussed calcium and vit D intake Mammogram next month  2. Mixed incontinence Discussed option of PT  3. Vaginal atrophy - Estradiol (VAGIFEM) 10 MCG TABS vaginal tablet; INSERT 1 TABLET VAGINALLY EVERY NIGHT AT BEDTIME FOR 1 WEEK, THEN TWICE WEEKLY AT BEDTIME  Dispense: 24 tablet; Refill: 3  4. History of osteoporosis - DG Bone Density; Future  -Due at the end of June  5. Colon cancer screening - Ambulatory referral to Gastroenterology  6. Hypoestrogenism - DG Bone Density; Future

## 2022-11-24 ENCOUNTER — Ambulatory Visit (INDEPENDENT_AMBULATORY_CARE_PROVIDER_SITE_OTHER): Payer: No Typology Code available for payment source | Admitting: Obstetrics and Gynecology

## 2022-11-24 ENCOUNTER — Encounter: Payer: Self-pay | Admitting: Obstetrics and Gynecology

## 2022-11-24 VITALS — BP 128/68 | HR 75 | Ht 60.63 in | Wt 138.0 lb

## 2022-11-24 DIAGNOSIS — Z01419 Encounter for gynecological examination (general) (routine) without abnormal findings: Secondary | ICD-10-CM

## 2022-11-24 DIAGNOSIS — Z1211 Encounter for screening for malignant neoplasm of colon: Secondary | ICD-10-CM

## 2022-11-24 DIAGNOSIS — N3946 Mixed incontinence: Secondary | ICD-10-CM | POA: Diagnosis not present

## 2022-11-24 DIAGNOSIS — N952 Postmenopausal atrophic vaginitis: Secondary | ICD-10-CM

## 2022-11-24 DIAGNOSIS — E2839 Other primary ovarian failure: Secondary | ICD-10-CM

## 2022-11-24 DIAGNOSIS — Z8739 Personal history of other diseases of the musculoskeletal system and connective tissue: Secondary | ICD-10-CM | POA: Diagnosis not present

## 2022-11-24 MED ORDER — ESTRADIOL 10 MCG VA TABS: ORAL_TABLET | VAGINAL | 3 refills | Status: DC

## 2022-11-24 NOTE — Patient Instructions (Signed)
Kegel Exercises  Kegel exercises can help strengthen your pelvic floor muscles. The pelvic floor is a group of muscles that support your rectum, small intestine, and bladder. In females, pelvic floor muscles also help support the uterus. These muscles help you control the flow of urine and stool (feces). Kegel exercises are painless and simple. They do not require any equipment. Your provider may suggest Kegel exercises to: Improve bladder and bowel control. Improve sexual response. Improve weak pelvic floor muscles after surgery to remove the uterus (hysterectomy) or after pregnancy, in females. Improve weak pelvic floor muscles after prostate gland removal or surgery, in males. Kegel exercises involve squeezing your pelvic floor muscles. These are the same muscles you squeeze when you try to stop the flow of urine or keep from passing gas. The exercises can be done while sitting, standing, or lying down, but it is best to vary your position. Ask your health care provider which exercises are safe for you. Do exercises exactly as told by your health care provider and adjust them as directed. Do not begin these exercises until told by your health care provider. Exercises How to do Kegel exercises: Squeeze your pelvic floor muscles tight. You should feel a tight lift in your rectal area. If you are a female, you should also feel a tightness in your vaginal area. Keep your stomach, buttocks, and legs relaxed. Hold the muscles tight for up to 10 seconds. Breathe normally. Relax your muscles for up to 10 seconds. Repeat as told by your health care provider. Repeat this exercise daily as told by your health care provider. Continue to do this exercise for at least 4-6 weeks, or for as long as told by your health care provider. You may be referred to a physical therapist who can help you learn more about how to do Kegel exercises. Depending on your condition, your health care provider may  recommend: Varying how long you squeeze your muscles. Doing several sets of exercises every day. Doing exercises for several weeks. Making Kegel exercises a part of your regular exercise routine. This information is not intended to replace advice given to you by your health care provider. Make sure you discuss any questions you have with your health care provider. Document Revised: 12/17/2020 Document Reviewed: 12/17/2020 Elsevier Patient Education  2023 [employer]vier Inc.  

## 2022-11-29 ENCOUNTER — Other Ambulatory Visit: Payer: Self-pay

## 2022-11-29 ENCOUNTER — Encounter: Payer: Self-pay | Admitting: Allergy and Immunology

## 2022-11-29 ENCOUNTER — Ambulatory Visit: Payer: Self-pay

## 2022-11-29 ENCOUNTER — Ambulatory Visit (INDEPENDENT_AMBULATORY_CARE_PROVIDER_SITE_OTHER): Payer: No Typology Code available for payment source | Admitting: Allergy and Immunology

## 2022-11-29 VITALS — BP 116/82 | HR 79 | Temp 98.2°F | Resp 16 | Ht 64.5 in | Wt 138.2 lb

## 2022-11-29 DIAGNOSIS — J4520 Mild intermittent asthma, uncomplicated: Secondary | ICD-10-CM

## 2022-11-29 DIAGNOSIS — J301 Allergic rhinitis due to pollen: Secondary | ICD-10-CM

## 2022-11-29 DIAGNOSIS — J309 Allergic rhinitis, unspecified: Secondary | ICD-10-CM

## 2022-11-29 DIAGNOSIS — T781XXD Other adverse food reactions, not elsewhere classified, subsequent encounter: Secondary | ICD-10-CM

## 2022-11-29 DIAGNOSIS — J3089 Other allergic rhinitis: Secondary | ICD-10-CM

## 2022-11-29 MED ORDER — MONTELUKAST SODIUM 10 MG PO TABS: 10.0000 mg | ORAL_TABLET | Freq: Every evening | ORAL | 3 refills | Status: DC

## 2022-11-29 MED ORDER — MOMETASONE FUROATE 50 MCG/ACT NA SUSP: 2.0000 | Freq: Two times a day (BID) | NASAL | 3 refills | Status: AC

## 2022-11-29 MED ORDER — ALBUTEROL SULFATE HFA 108 (90 BASE) MCG/ACT IN AERS: 2.0000 | INHALATION_SPRAY | Freq: Four times a day (QID) | RESPIRATORY_TRACT | 1 refills | Status: AC | PRN

## 2022-11-29 MED ORDER — LEVOCETIRIZINE DIHYDROCHLORIDE 5 MG PO TABS: 5.0000 mg | ORAL_TABLET | Freq: Every day | ORAL | 3 refills | Status: DC | PRN

## 2022-11-29 MED ORDER — OLOPATADINE HCL 0.1 % OP SOLN: 1.0000 [drp] | Freq: Two times a day (BID) | OPHTHALMIC | 3 refills | Status: AC

## 2022-11-29 NOTE — Patient Instructions (Signed)
  1. Continue immunotherapy - discontinue after completing next extract vial     2. Continue Montelukast 10 mg - 1 tablet 1 time per day  3.  If needed:   A.  Pro-air HFA or similar 2 inhalations every 4-6 hours.  B.  OTC Pataday  C.  EpiPen Johny Shock, Benadryl, MD/ER for severe allergic reaction  D.  OTC Antihistamine  E.  Nasonex 1-2 sprays each nostril 1-2 times per day  4.  Return to clinic in 12 months or earlier if problem

## 2022-11-29 NOTE — Progress Notes (Signed)
[place]   Follow-up Note  Referring Provider: Nche, Bonna Gainsharlotte Lum, NP Primary Provider: Anne NgNche, Charlotte Lum, NP Date of Office Visit: 11/29/2022  Subjective:   Yolanda [name] (DOB: 02/02/1961) is a 62 y.o. female who [facility] in re-evaluation of the following:  HPI: Yolanda Ramusna return to this clinic in evaluation of asthma, allergic rhinitis, oral allergy syndrome.  I last saw her in this clinic 30 November 2021.  She continues to receive immunotherapy directed against her atopic immune system and she has had an excellent response and has had no issues with asthma and has had no issues with her nose and rarely uses a short acting bronchodilator and can exert herself without any problem and has not required a systemic steroid or an antibiotic for any type of airway issue.  She has been able to expand consumption of fruits and vegetables with her immunotherapy.  There are still a few foods that she has not attempted to eat such as apple.  Allergies as of 11/29/2022       Reactions   Honey Bee Treatment [bee Venom] Anaphylaxis   Egg-derived Products    Stomach ache   Methocarbamol    Peanut-containing Drug Products Other (See Comments)   Allergy testing   Sesame Seed (diagnostic) Other (See Comments)   Allergy test   Soy Allergy Other (See Comments)   Allergy test   Wheat    Intolerance         Medication List    albuterol 108 (90 Base) MCG/ACT inhaler Commonly known as: VENTOLIN HFA Inhale 2 puffs into the lungs every 6 (six) hours as needed for wheezing or shortness of breath.   buPROPion 150 MG 24 hr tablet Commonly known as: WELLBUTRIN XL Take 150 mg by mouth 2 (two) times daily.   CALCIUM 600 PO Take 1 tablet by mouth daily.   EPINEPHrine 0.3 mg/0.3 mL Soaj injection Commonly known as: EPI-PEN Inject 0.3 mg into the muscle as needed for anaphylaxis.   Estradiol 10 MCG Tabs  vaginal tablet Commonly known as: Vagifem INSERT 1 TABLET VAGINALLY EVERY NIGHT AT BEDTIME FOR 1 WEEK, THEN TWICE WEEKLY AT BEDTIME   levocetirizine 5 MG tablet Commonly known as: XYZAL Take 1 tablet (5 mg total) by mouth daily as needed for allergies (Can take an extra dose during flare ups.). Started by: Jessica PriestERIC J Sierra Spargo, MD   LORazepam 0.5 MG tablet Commonly known as: ATIVAN Take 0.5 mg by mouth daily as needed for sleep.   MAGNESIUM GLYCINATE PLUS PO Take 2 capsules by mouth at bedtime.   mometasone 50 MCG/ACT nasal spray Commonly known as: NASONEX Place 2 sprays into the nose in the morning and at bedtime. What changed: when to take this Changed by: Paiten Boies Claudia PollockJ Cormac Wint, MD   montelukast 10 MG tablet Commonly known as: SINGULAIR Take 1 tablet (10 mg total) by mouth at bedtime. What changed: See the new instructions. Changed by: Dakwon Wenberg Claudia PollockJ Bryson Gavia, MD   multivitamin tablet Take 1 tablet by mouth 2 (two) times daily.   olopatadine 0.1 % ophthalmic solution Commonly known as: PATANOL Place 1 drop into both eyes 2 (two) times daily.   OMEGAPURE 780 EC PO Take 1 tablet by mouth daily.    Past Medical History:  Diagnosis Date   Acute appendicitis 09/14/2018   Allergy    Anemia    Anxiety    Asthma  BRCA negative 12/2014   Depression    Endometriosis    Esophagitis    Fibroid    GERD (gastroesophageal reflux disease)    Hiatal hernia    History of diverticulitis    IC (interstitial cystitis)    Osteoporosis 01/2019   2018 T score -2.6, 2020 T score -2.0 on Actonel   Pain in the coccyx 12/04/2018   Schatzki's ring     Past Surgical History:  Procedure Laterality Date   CHOLECYSTECTOMY     HYSTEROSCOPY  12/2003   RESECTON OF SUBMUCOUS  MYOMA/DX SCOPE   LAPAROSCOPIC APPENDECTOMY N/A 09/14/2018   Procedure: APPENDECTOMY LAPAROSCOPIC;  Surgeon: Andria Meuse, MD;  Location: [facility];  Service: General;  Laterality: N/A;   LAPAROSCOPIC BILATERAL SALPINGO OOPHERECTOMY  Bilateral 07/02/2014   Procedure: LAPAROSCOPIC BILATERAL SALPINGO OOPHORECTOMY;  Surgeon: Dara Lords, MD;  Location: [facility];  Service: Gynecology;  Laterality: Bilateral;   PELVIC LAPAROSCOPY  12/2003   DX SCOPE/RESECTION OF SUBMUCOUS MYOMA   TMJ ARTHROSCOPY  11-26-2001   ULNAR TUNNEL RELEASE Right 06/01/2022   Procedure: CUBITAL TUNNEL RELEASE WITH POSSIBLE ANTERIOR TRANSPOSITION;  Surgeon: Marlyne Beards, MD;  Location: [facility];  Service: Orthopedics;  Laterality: Right;  62   VAGINAL HYSTERECTOMY  12/05   Menorrhagia/dysmenorrhea. Pathology showed leiomyoma/adenomyosis    Review of systems negative except as noted in HPI / PMHx or noted below:  Review of Systems  Constitutional: Negative.   HENT: Negative.    Eyes: Negative.   Respiratory: Negative.    Cardiovascular: Negative.   Gastrointestinal: Negative.   Genitourinary: Negative.   Musculoskeletal: Negative.   Skin: Negative.   Neurological: Negative.   Endo/Heme/Allergies: Negative.   Psychiatric/Behavioral: Negative.       Objective:   Vitals:   11/29/22 1042  BP: 116/82  Pulse: 79  Resp: 16  Temp: 98.2 F (36.8 C)  SpO2: 98%   Height: 5' 4.5" (163.8 cm)  Weight: 138 lb 3.2 oz (62.7 kg)   Physical Exam Constitutional:      Appearance: She is not diaphoretic.  HENT:     Head: Normocephalic.     Right Ear: Tympanic membrane, ear canal and external ear normal.     Left Ear: Tympanic membrane, ear canal and external ear normal.     Nose: Nose normal. No mucosal edema or rhinorrhea.     Mouth/Throat:     Pharynx: Uvula midline. No oropharyngeal exudate.  Eyes:     Conjunctiva/sclera: Conjunctivae normal.  Neck:     Thyroid: No thyromegaly.     Trachea: Trachea normal. No tracheal tenderness or tracheal deviation.  Cardiovascular:     Rate and Rhythm: Normal rate and regular rhythm.     Heart sounds: Normal heart sounds, S1 normal and S2 normal. No murmur heard. Pulmonary:      Effort: No respiratory distress.     Breath sounds: Normal breath sounds. No stridor. No wheezing or rales.  Lymphadenopathy:     Head:     Right side of head: No tonsillar adenopathy.     Left side of head: No tonsillar adenopathy.     Cervical: No cervical adenopathy.  Skin:    Findings: No erythema or rash.     Nails: There is no clubbing.  Neurological:     Mental Status: She is alert.     Diagnostics: none  Assessment and Plan:   1. Asthma, mild intermittent, well-controlled   2. Perennial allergic rhinitis   3.  Seasonal allergic rhinitis due to pollen   4. Oral allergy syndrome, subsequent encounter    1. Continue immunotherapy - discontinue after completing next extract vial     2. Continue Montelukast 10 mg - 1 tablet 1 time per day  3.  If needed:   A.  Albuterol HFA - 2 inhalations every 4-6 hours.  B.  OTC Pataday  C.  EpiPen Johny Shock, Benadryl, MD/ER for severe allergic reaction  D.  OTC Antihistamine  E.  Nasonex 1-2 sprays each nostril 1-2 times per day  4.  Return to clinic in 12 months or earlier if problem  Dorcas has had an excellent response to the administration of immunotherapy regarding both her atopic respiratory disease and her oral allergy syndrome.  We are going to have her complete a full 5 years of immunotherapy and thus she will be discontinuing this form of treatment 2025.  There is a selection of agents to be utilized should they be required as noted above.  Laurette Schimke, MD Allergy / Immunology [facility]

## 2022-11-30 ENCOUNTER — Encounter: Payer: Self-pay | Admitting: Allergy and Immunology

## 2022-11-30 NOTE — Addendum Note (Signed)
Addended by: Areta Haber B on: 11/30/2022 02:01 PM   Modules accepted: Orders

## 2022-12-21 ENCOUNTER — Encounter: Payer: Self-pay | Admitting: Internal Medicine

## 2022-12-26 ENCOUNTER — Ambulatory Visit (INDEPENDENT_AMBULATORY_CARE_PROVIDER_SITE_OTHER): Payer: No Typology Code available for payment source | Admitting: *Deleted

## 2022-12-26 DIAGNOSIS — J309 Allergic rhinitis, unspecified: Secondary | ICD-10-CM | POA: Diagnosis not present

## 2023-01-23 ENCOUNTER — Ambulatory Visit (INDEPENDENT_AMBULATORY_CARE_PROVIDER_SITE_OTHER): Payer: No Typology Code available for payment source | Admitting: *Deleted

## 2023-01-23 DIAGNOSIS — J309 Allergic rhinitis, unspecified: Secondary | ICD-10-CM

## 2023-01-25 ENCOUNTER — Encounter: Payer: Self-pay | Admitting: Obstetrics and Gynecology

## 2023-01-26 ENCOUNTER — Telehealth: Payer: Self-pay | Admitting: Nurse Practitioner

## 2023-01-26 NOTE — Telephone Encounter (Signed)
ERROR

## 2023-01-27 ENCOUNTER — Telehealth: Payer: Self-pay | Admitting: Nurse Practitioner

## 2023-01-27 ENCOUNTER — Ambulatory Visit: Payer: No Typology Code available for payment source | Admitting: Nurse Practitioner

## 2023-01-27 NOTE — Telephone Encounter (Signed)
6.7.24 No show but pt called and said she has to take husband to E.D. bu no letter sent

## 2023-01-30 NOTE — Telephone Encounter (Signed)
in ER with spouse - no fee & no letter sent - 1st missed visit

## 2023-02-06 ENCOUNTER — Telehealth: Payer: Self-pay

## 2023-02-06 ENCOUNTER — Encounter: Payer: Self-pay | Admitting: Nurse Practitioner

## 2023-02-06 ENCOUNTER — Ambulatory Visit (INDEPENDENT_AMBULATORY_CARE_PROVIDER_SITE_OTHER): Payer: No Typology Code available for payment source | Admitting: Nurse Practitioner

## 2023-02-06 VITALS — BP 130/80 | HR 74 | Temp 98.4°F | Resp 16 | Ht 64.5 in | Wt 139.6 lb

## 2023-02-06 DIAGNOSIS — R21 Rash and other nonspecific skin eruption: Secondary | ICD-10-CM | POA: Diagnosis not present

## 2023-02-06 DIAGNOSIS — D229 Melanocytic nevi, unspecified: Secondary | ICD-10-CM | POA: Insufficient documentation

## 2023-02-06 DIAGNOSIS — D235 Other benign neoplasm of skin of trunk: Secondary | ICD-10-CM

## 2023-02-06 HISTORY — DX: Melanocytic nevi, unspecified: D22.9

## 2023-02-06 MED ORDER — KETOCONAZOLE 2 % EX CREA
1.0000 | TOPICAL_CREAM | Freq: Two times a day (BID) | CUTANEOUS | 0 refills | Status: DC
Start: 2023-02-06 — End: 2023-05-08

## 2023-02-06 NOTE — Patient Instructions (Signed)
Proceed with breast MRI Let me know if rash does not improve in 2weeks

## 2023-02-06 NOTE — Telephone Encounter (Signed)
Pt LVM in triage line stating that Yolanda [name] is recommending that she have a breast MRI as supplemental screening due to high lifetime risk measured @ 23.7%. However, a referral is needed.   LVMTCB. Will notify pt that this is usually done at a 6 month interval btwn mammogram screening. Will place recall in pt's EMR to remind her to call us for order/appt.

## 2023-02-06 NOTE — Progress Notes (Signed)
Established Patient Visit  Patient: Yolanda [name]   DOB: July 26, 1961   62 y.o. Female  MRN: [id number] Visit Date: 02/06/2023  Subjective:    Chief Complaint  Patient presents with   Nevus    Pt is concern about a couple of bumps on her chest.  Noticed a month ago    Rash This is a new problem. The current episode started 1 to 4 weeks ago. The problem is unchanged. Location: left LE. The rash is characterized by redness. She was exposed to nothing. Pertinent negatives include no fatigue, joint pain or nail changes. Treatments tried: lotrimin. The treatment provided no relief. There is no history of allergies, asthma, eczema or varicella.   She has another lesion on right upper chest wall, present for several months, but recently felt irritated. She is unable to get appointment with dermatology for another 2month.  Reviewed medical, surgical, and social history today  Medications: Outpatient Medications Prior to Visit  Medication Sig   albuterol (VENTOLIN HFA) 108 (90 Base) MCG/ACT inhaler Inhale 2 puffs into the lungs every 6 (six) hours as needed for wheezing or shortness of breath.   buPROPion (WELLBUTRIN XL) 150 MG 24 hr tablet Take 150 mg by mouth 2 (two) times daily.   Calcium Carbonate (CALCIUM 600 PO) Take 1 tablet by mouth daily.   EPINEPHrine 0.3 mg/0.3 mL IJ SOAJ injection Inject 0.3 mg into the muscle as needed for anaphylaxis.   Estradiol (VAGIFEM) 10 MCG TABS vaginal tablet INSERT 1 TABLET VAGINALLY EVERY NIGHT AT BEDTIME FOR 1 WEEK, THEN TWICE WEEKLY AT BEDTIME   levocetirizine (XYZAL) 5 MG tablet Take 1 tablet (5 mg total) by mouth daily as needed for allergies (Can take an extra dose during flare ups.).   LORazepam (ATIVAN) 0.5 MG tablet Take 0.5 mg by mouth daily as needed for sleep.    MAGNESIUM GLYCINATE PLUS PO Take 2 capsules by mouth at bedtime.   mometasone (NASONEX) 50 MCG/ACT nasal spray Place 2 sprays into the nose in the morning and at bedtime.    montelukast (SINGULAIR) 10 MG tablet Take 1 tablet (10 mg total) by mouth at bedtime.   Multiple Vitamin (MULTIVITAMIN) tablet Take 1 tablet by mouth 2 (two) times daily.   olopatadine (PATANOL) 0.1 % ophthalmic solution Place 1 drop into both eyes 2 (two) times daily.   Omega-3 Fatty Acids (OMEGAPURE 780 EC PO) Take 1 tablet by mouth daily.   No facility-administered medications prior to visit.   Reviewed past medical and social history.   ROS per HPI above      Objective:  BP 130/80 (BP Location: Left Arm, Patient Position: Sitting, Cuff Size: Large)   Pulse 74   Temp 98.4 F (36.9 C) (Temporal)   Resp 16   Ht 5' 4.5" (1.638 m)   Wt 139 lb 9.6 oz (63.3 kg)   SpO2 99%   BMI 23.59 kg/m      Physical Exam Vitals and nursing note reviewed.  Skin:    Findings: Rash present. Rash is papular.          Comments: Circular maculopapular rash with central clearing on left lower leg, no erythema Nevus on right upper chest wall, symmetrical, no hyperpigmentation, skin intact, no erythema.   Neurological:     Mental Status: She is alert.    Cryotherapy Obtained verbal consent from Ms. Krock. Procedure was explained. Advised about possible need  to repeat procedure in 1week. Advised about possible redness at site.She/He verbalized understanding. nevus on right upper chest wall, number of lesions:1 Method of Treatment: use Histofreezer to apply liquid nitrogen x 10seconds on lesion. Well tolerated. Applied bandaid to site No complication reported.   No results found for any visits on 02/06/23.    Assessment & Plan:    Problem List Items Addressed This Visit       Musculoskeletal and Integument   Benign pigmented mole   Other Visit Diagnoses     Rash    -  Primary   Relevant Medications   ketoconazole (NIZORAL) 2 % cream      She plans to f/up with dermatology next month if lesions do not resolve. Advised to used skin screen at all times.  Return if symptoms worsen  or fail to improve.     Alysia Penna, NP

## 2023-02-10 NOTE — Telephone Encounter (Signed)
Spoke w/ pt and notified her of 69mo interval btwn screening mammo and supplemental screening w/ MRI. Pt voiced understanding. Recall placed for 07/2023. Pt wanted to also inform us that the last time she had to have imaging done w/ contrast, she had some GI upset w/ lightheadedness and near synope so wanted to inquire with [place] what we would recommend for this imaging? Please advise. Thanks.

## 2023-02-14 NOTE — Telephone Encounter (Signed)
Pt states that it was definitely a reaction to the contrast. States that she has had imaging done w/o contrast and has done just fine. Denies any h/o issues w/ claustrophobia in past. Denies knowledge of reaction w/ rash or difficulty breathing. Will reach out to radiology at my earliest convenience to inquire.  CC: Dr. Oscar La

## 2023-02-14 NOTE — Telephone Encounter (Signed)
Does she think she was having a reaction to the contrast or having symptoms from being claustrophobic in the MR machine? If she thinks it was the contrast, I would double check with Radiology, I haven't had patient's c/o of that reaction previously. No issues with a rash or difficulty breathing?

## 2023-02-15 NOTE — Telephone Encounter (Signed)
Spoke w/ Geraldine Contras who transferred me over to a tech. Enid Derry said that they typically have the pt's do a 13 hour prep prior to the administration of the contrast. Can place reminder in recall of the 13hr prep before MRI. Reminder placed. I notified pt of the prep prior to the imaging. She voiced understanding. Will route to provider for final review and close.

## 2023-02-21 ENCOUNTER — Encounter: Payer: Self-pay | Admitting: Internal Medicine

## 2023-02-21 ENCOUNTER — Ambulatory Visit: Payer: No Typology Code available for payment source | Admitting: Internal Medicine

## 2023-02-21 VITALS — BP 120/80 | HR 88 | Temp 98.2°F | Ht 64.5 in | Wt 139.2 lb

## 2023-02-21 DIAGNOSIS — J069 Acute upper respiratory infection, unspecified: Secondary | ICD-10-CM | POA: Diagnosis not present

## 2023-02-21 LAB — POCT INFLUENZA A/B
Influenza A, POC: NEGATIVE
Influenza B, POC: NEGATIVE

## 2023-02-21 LAB — POCT RAPID STREP A (OFFICE): Rapid Strep A Screen: NEGATIVE

## 2023-02-21 LAB — POC COVID19 BINAXNOW: SARS Coronavirus 2 Ag: NEGATIVE

## 2023-02-21 NOTE — Patient Instructions (Signed)
Alternate ibuprofen and tylenol for fever and sore throat  Drink plenty of fluids  Salt water gargles as needed for sore throat  Rest   Return if symptoms worsen or do not improve.

## 2023-02-21 NOTE — Progress Notes (Signed)
[facility] [address] [place] [address] Dept: [phone] Dept Fax: [phone]  Acute Care Office Visit  Subjective:   Yolanda [name] 1961-02-14 02/21/2023  Chief Complaint  Patient presents with   Sore Throat    Fever started sunday    HPI: Discussed the use of AI scribe software for clinical note transcription with the patient, who gave verbal consent to proceed.  History of Present Illness   The patient presents with a sore throat that started on Sunday. They describe the pain as severe and it is associated with difficulty swallowing. They also report a headache, low-grade fever, and tenderness in the neck. They deny any nasal congestion, but do report a sensation of pressure and a slight burning in the ears. They deny any chills, nausea, vomiting, diarrhea, or cough. Their appetite has been decreased since the onset of symptoms. They were exposed to their grandson who had similar symptoms a week prior. They have been self-treating with Zycam, elderberry, and Aleve, which has provided some relief for the soreness.      The following portions of the patient's history were reviewed and updated as appropriate: past medical history, past surgical history, family history, social history, allergies, medications, and problem list.   Patient Active Problem List   Diagnosis Date Noted   Benign pigmented mole 02/06/2023   Mild episode of recurrent major depressive disorder (HCC) 09/26/2022   Anxiety disorder 09/26/2022   Seborrheic keratoses 06/21/2022   Ulnar nerve entrapment at elbow 04/21/2022   Numbness and tingling in right hand 04/04/2022   Chronic pain of right elbow 10/22/2021   Medial epicondylitis of elbow, left 06/15/2020   Renal cyst, right 09/19/2019   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/18/2019   Lumbar radiculopathy 09/09/2019   Closed fracture of coccyx (HCC) 12/04/2018   Food allergy 03/20/2018    Genetic testing 12/23/2014   Family history of breast cancer    Family history of uterine cancer    Osteoporosis 10/04/2014   Pancreatic duct dilated 05/10/2012   IBS 08/01/2008   UNS ADVRS EFF UNS RX MEDICINAL&BIOLOGICAL SBSTNC 01/21/2008   Depression, major, single episode, complete remission (HCC) 01/18/2008   RHINITIS, CHRONIC 01/18/2008   Past Medical History:  Diagnosis Date   Acute appendicitis 09/14/2018   Allergy    Anemia    Anxiety    Asthma    BRCA negative 12/2014   Depression    Endometriosis    Esophagitis    Fibroid    GERD (gastroesophageal reflux disease)    Hiatal hernia    History of diverticulitis    IC (interstitial cystitis)    Osteoporosis 01/2019   2018 T score -2.6, 2020 T score -2.0 on Actonel   Pain in the coccyx 12/04/2018   Schatzki's ring    Past Surgical History:  Procedure Laterality Date   CHOLECYSTECTOMY     HYSTEROSCOPY  12/2003   RESECTON OF SUBMUCOUS  MYOMA/DX SCOPE   LAPAROSCOPIC APPENDECTOMY N/A 09/14/2018   Procedure: APPENDECTOMY LAPAROSCOPIC;  Surgeon: Andria Meuse, MD;  Location: [facility];  Service: General;  Laterality: N/A;   LAPAROSCOPIC BILATERAL SALPINGO OOPHERECTOMY Bilateral 07/02/2014   Procedure: LAPAROSCOPIC BILATERAL SALPINGO OOPHORECTOMY;  Surgeon: Dara Lords, MD;  Location: [facility];  Service: Gynecology;  Laterality: Bilateral;   PELVIC LAPAROSCOPY  12/2003   DX SCOPE/RESECTION OF SUBMUCOUS MYOMA   TMJ ARTHROSCOPY  11-26-2001   ULNAR TUNNEL RELEASE Right 06/01/2022   Procedure: CUBITAL TUNNEL  RELEASE WITH POSSIBLE ANTERIOR TRANSPOSITION;  Surgeon: Marlyne Beards, MD;  Location: [facility];  Service: Orthopedics;  Laterality: Right;  62   VAGINAL HYSTERECTOMY  12/05   Menorrhagia/dysmenorrhea. Pathology showed leiomyoma/adenomyosis   Family History  Problem Relation Age of Onset   Depression Mother    Hyperlipidemia Mother    Hypertension Mother    Mental illness Mother     Miscarriages / [place] Mother    Pulmonary fibrosis Mother        possible textile mil exposure   Emphysema Father    Early death Father    Breast cancer Sister        paternal half sister dx in her early 71s   Breast cancer Sister        paternal half sister dx in her late 72s   Breast cancer Sister    Diabetes Brother 96       HALF BROTHER/HALF BROTHER    GI problems Brother    Uterine cancer Maternal Grandmother        dx in her 72s   Cystic fibrosis Other        brother's granddauther   Cystic fibrosis Other        brothers granddaughters   Colon cancer Neg Hx    Esophageal cancer Neg Hx    Rectal cancer Neg Hx    Stomach cancer Neg Hx    Allergic rhinitis Neg Hx    Asthma Neg Hx    Eczema Neg Hx    Urticaria Neg Hx    Outpatient Medications Prior to Visit  Medication Sig Dispense Refill   albuterol (VENTOLIN HFA) 108 (90 Base) MCG/ACT inhaler Inhale 2 puffs into the lungs every 6 (six) hours as needed for wheezing or shortness of breath. 18 g 1   buPROPion (WELLBUTRIN XL) 150 MG 24 hr tablet Take 150 mg by mouth 2 (two) times daily.     Calcium Carbonate (CALCIUM 600 PO) Take 1 tablet by mouth daily.     Estradiol (VAGIFEM) 10 MCG TABS vaginal tablet INSERT 1 TABLET VAGINALLY EVERY NIGHT AT BEDTIME FOR 1 WEEK, THEN TWICE WEEKLY AT BEDTIME 24 tablet 3   ketoconazole (NIZORAL) 2 % cream Apply 1 Application topically 2 (two) times daily. 15 g 0   levocetirizine (XYZAL) 5 MG tablet Take 1 tablet (5 mg total) by mouth daily as needed for allergies (Can take an extra dose during flare ups.). 180 tablet 3   LORazepam (ATIVAN) 0.5 MG tablet Take 0.5 mg by mouth daily as needed for sleep.      MAGNESIUM GLYCINATE PLUS PO Take 2 capsules by mouth at bedtime.     mometasone (NASONEX) 50 MCG/ACT nasal spray Place 2 sprays into the nose in the morning and at bedtime. 51 g 3   montelukast (SINGULAIR) 10 MG tablet Take 1 tablet (10 mg total) by mouth at bedtime. 90 tablet 3    Multiple Vitamin (MULTIVITAMIN) tablet Take 1 tablet by mouth 2 (two) times daily.     olopatadine (PATANOL) 0.1 % ophthalmic solution Place 1 drop into both eyes 2 (two) times daily. 15 mL 3   Omega-3 Fatty Acids (OMEGAPURE 780 EC PO) Take 1 tablet by mouth daily.     EPINEPHrine 0.3 mg/0.3 mL IJ SOAJ injection Inject 0.3 mg into the muscle as needed for anaphylaxis. (Patient not taking: Reported on 02/21/2023) 2 each 2   No facility-administered medications prior to visit.   Allergies  Allergen Reactions  Honey Bee Treatment [Bee Venom] Anaphylaxis   Egg-Derived Products     Stomach ache   Methocarbamol    Peanut-Containing Drug Products Other (See Comments)    Allergy testing   Sesame Seed (Diagnostic) Other (See Comments)    Allergy test   Soy Allergy Other (See Comments)    Allergy test   Wheat     Intolerance      ROS: A complete ROS was performed with pertinent positives/negatives noted in the HPI. The remainder of the ROS are negative.    Objective:   Today's Vitals   02/21/23 0836  BP: 120/80  Pulse: 88  Temp: 98.2 F (36.8 C)  TempSrc: Temporal  SpO2: 99%  Weight: 139 lb 3.2 oz (63.1 kg)  Height: 5' 4.5" (1.638 m)    Physical Exam   GENERAL: Well-appearing, in NAD. Well nourished.  SKIN: Skin pink, warm and dry, no rash.  NECK: Trachea midline. Full ROM w/o pain or tenderness. No lymphadenopathy.  HEENT: Head normocephalic. Eyes conjunctiva clear without redness or exudate. Ears without erythema or bulging. Nose turbinates without swelling, no active drainage. Throat oropharynx erythematous, no tonsillar swelling or exudate. CHEST: Lungs clear to auscultation bilaterally. Respirations even and non-labored.  CARDIOVASCULAR: S1, S2 present, normal rate and rhythm. 2+ pulses bilaterally. EXTREMITIES: Without clubbing, cyanosis, or edema.  NEUROLOGIC: No motor or sensory deficits. Steady, even gait.  PSYCH/MENTAL STATUS: Alert, oriented x 3. Cooperative,  appropriate mood and affect.    Results for orders placed or performed in visit on 02/21/23  POC COVID-19  Result Value Ref Range   SARS Coronavirus 2 Ag Negative Negative  POCT Influenza A/B  Result Value Ref Range   Influenza A, POC Negative Negative   Influenza B, POC Negative Negative  POCT rapid strep A  Result Value Ref Range   Rapid Strep A Screen Negative Negative      Assessment & Plan:  Assessment and Plan    Viral Upper Respiratory Infection: Sore throat, headache, and low-grade fever started on Sunday. Throat is erythematous on exam, but no tonsillar swelling or exudate. COVID, flu, and strep tests are all negative. -Continue Tylenol and ibuprofen as needed for fever and sore throat. -Use saltwater gargles as needed for sore throat. -Ensure adequate fluid intake and rest. -Return to clinic if symptoms worsen or do not improve.      Lab Orders         POC COVID-19         POCT Influenza A/B         POCT rapid strep A     No images are attached to the encounter or orders placed in the encounter.  Return if symptoms worsen or fail to improve.   Of note, portions of this note may have been created with voice recognition software Physicist, medical). While this note has been edited for accuracy, occasional wrong-word or 'sound-a-like' substitutions may have occurred due to the inherent limitations of voice recognition software.   Salvatore Decent, FNP

## 2023-02-22 ENCOUNTER — Ambulatory Visit (INDEPENDENT_AMBULATORY_CARE_PROVIDER_SITE_OTHER): Payer: No Typology Code available for payment source | Admitting: *Deleted

## 2023-02-22 DIAGNOSIS — J309 Allergic rhinitis, unspecified: Secondary | ICD-10-CM | POA: Diagnosis not present

## 2023-02-26 ENCOUNTER — Ambulatory Visit
Admission: EM | Admit: 2023-02-26 | Discharge: 2023-02-26 | Disposition: A | Discharge status: Home / Self Care | Payer: No Typology Code available for payment source | Attending: Internal Medicine | Admitting: Internal Medicine

## 2023-02-26 DIAGNOSIS — J019 Acute sinusitis, unspecified: Secondary | ICD-10-CM

## 2023-02-26 DIAGNOSIS — H109 Unspecified conjunctivitis: Secondary | ICD-10-CM

## 2023-02-26 DIAGNOSIS — J310 Chronic rhinitis: Secondary | ICD-10-CM

## 2023-02-26 DIAGNOSIS — B9689 Other specified bacterial agents as the cause of diseases classified elsewhere: Secondary | ICD-10-CM

## 2023-02-26 MED ORDER — TOBRAMYCIN 0.3 % OP SOLN: 1.0000 [drp] | OPHTHALMIC | 0 refills | Status: DC

## 2023-02-26 MED ORDER — PREDNISONE 20 MG PO TABS: ORAL_TABLET | ORAL | 0 refills | Status: DC

## 2023-02-26 MED ORDER — AMOXICILLIN-POT CLAVULANATE 875-125 MG PO TABS: 1.0000 | ORAL_TABLET | Freq: Two times a day (BID) | ORAL | 0 refills | Status: DC

## 2023-02-26 MED ORDER — PROMETHAZINE-DM 6.25-15 MG/5ML PO SYRP: 5.0000 mL | ORAL_SOLUTION | Freq: Every evening | ORAL | 0 refills | Status: DC | PRN

## 2023-02-26 NOTE — ED Triage Notes (Signed)
Pt c/o head congestion, sore throat, cough x 1 week-seen by PCP last week with neg strep, flu, covid at the office-concerned with redness and drainage to both eyes started yesterday-NAD-steady gait

## 2023-02-26 NOTE — ED Provider Notes (Signed)
[facility]  Note:  This document was prepared using Conservation officer, historic buildings and may include unintentional dictation errors.  MRN: [id number] DOB: July 20, 1961  Subjective:   Yolanda [name] is a 62 y.o. female presenting for 1 week history of persistent sinus congestion, productive green mucus from the sinuses, sinus pressure, throat pain, malaise, fatigue.  Started to have a cough in the past few days, eye redness, swelling and drainage.  Has past medical history of chronic rhinitis,, chronic allergic rhinitis.  Has been taking Allegra and pseudoephedrine.  She did have an office visit already and tested negative for COVID, strep and flu.  No chest pain, shortness of breath or wheezing.  No smoking of any kind including cigarettes, cigars, vaping, marijuana use.    No current facility-administered medications for this encounter.  Current Outpatient Medications:    albuterol (VENTOLIN HFA) 108 (90 Base) MCG/ACT inhaler, Inhale 2 puffs into the lungs every 6 (six) hours as needed for wheezing or shortness of breath., Disp: 18 g, Rfl: 1   buPROPion (WELLBUTRIN XL) 150 MG 24 hr tablet, Take 150 mg by mouth 2 (two) times daily., Disp: , Rfl:    Calcium Carbonate (CALCIUM 600 PO), Take 1 tablet by mouth daily., Disp: , Rfl:    EPINEPHrine 0.3 mg/0.3 mL IJ SOAJ injection, Inject 0.3 mg into the muscle as needed for anaphylaxis. (Patient not taking: Reported on 02/21/2023), Disp: 2 each, Rfl: 2   Estradiol (VAGIFEM) 10 MCG TABS vaginal tablet, INSERT 1 TABLET VAGINALLY EVERY NIGHT AT BEDTIME FOR 1 WEEK, THEN TWICE WEEKLY AT BEDTIME, Disp: 24 tablet, Rfl: 3   ketoconazole (NIZORAL) 2 % cream, Apply 1 Application topically 2 (two) times daily., Disp: 15 g, Rfl: 0   levocetirizine (XYZAL) 5 MG tablet, Take 1 tablet (5 mg total) by mouth daily as needed for allergies (Can take an extra dose during flare ups.)., Disp: 180 tablet, Rfl: 3   LORazepam (ATIVAN) 0.5 MG tablet, Take  0.5 mg by mouth daily as needed for sleep. , Disp: , Rfl:    MAGNESIUM GLYCINATE PLUS PO, Take 2 capsules by mouth at bedtime., Disp: , Rfl:    mometasone (NASONEX) 50 MCG/ACT nasal spray, Place 2 sprays into the nose in the morning and at bedtime., Disp: 51 g, Rfl: 3   montelukast (SINGULAIR) 10 MG tablet, Take 1 tablet (10 mg total) by mouth at bedtime., Disp: 90 tablet, Rfl: 3   Multiple Vitamin (MULTIVITAMIN) tablet, Take 1 tablet by mouth 2 (two) times daily., Disp: , Rfl:    olopatadine (PATANOL) 0.1 % ophthalmic solution, Place 1 drop into both eyes 2 (two) times daily., Disp: 15 mL, Rfl: 3   Omega-3 Fatty Acids (OMEGAPURE 780 EC PO), Take 1 tablet by mouth daily., Disp: , Rfl:    Allergies  Allergen Reactions   Honey Bee Treatment [Bee Venom] Anaphylaxis   Egg-Derived Products     Stomach ache   Methocarbamol    Peanut-Containing Drug Products Other (See Comments)    Allergy testing   Sesame Seed (Diagnostic) Other (See Comments)    Allergy test   Soy Allergy Other (See Comments)    Allergy test   Wheat     Intolerance     Past Medical History:  Diagnosis Date   Acute appendicitis 09/14/2018   Allergy    Anemia    Anxiety    Asthma    BRCA negative 12/2014   Depression    Endometriosis  Esophagitis    Fibroid    GERD (gastroesophageal reflux disease)    Hiatal hernia    History of diverticulitis    IC (interstitial cystitis)    Osteoporosis 01/2019   2018 T score -2.6, 2020 T score -2.0 on Actonel   Pain in the coccyx 12/04/2018   Schatzki's ring      Past Surgical History:  Procedure Laterality Date   CHOLECYSTECTOMY     HYSTEROSCOPY  12/2003   RESECTON OF SUBMUCOUS  MYOMA/DX SCOPE   LAPAROSCOPIC APPENDECTOMY N/A 09/14/2018   Procedure: APPENDECTOMY LAPAROSCOPIC;  Surgeon: Andria Meuse, MD;  Location: [facility];  Service: General;  Laterality: N/A;   LAPAROSCOPIC BILATERAL SALPINGO OOPHERECTOMY Bilateral 07/02/2014   Procedure: LAPAROSCOPIC BILATERAL  SALPINGO OOPHORECTOMY;  Surgeon: Dara Lords, MD;  Location: [facility];  Service: Gynecology;  Laterality: Bilateral;   PELVIC LAPAROSCOPY  12/2003   DX SCOPE/RESECTION OF SUBMUCOUS MYOMA   TMJ ARTHROSCOPY  11-26-2001   ULNAR TUNNEL RELEASE Right 06/01/2022   Procedure: CUBITAL TUNNEL RELEASE WITH POSSIBLE ANTERIOR TRANSPOSITION;  Surgeon: Marlyne Beards, MD;  Location: [facility];  Service: Orthopedics;  Laterality: Right;  62   VAGINAL HYSTERECTOMY  12/05   Menorrhagia/dysmenorrhea. Pathology showed leiomyoma/adenomyosis    Family History  Problem Relation Age of Onset   Depression Mother    Hyperlipidemia Mother    Hypertension Mother    Mental illness Mother    Miscarriages / [place] Mother    Pulmonary fibrosis Mother        possible textile mil exposure   Emphysema Father    Early death Father    Breast cancer Sister        paternal half sister dx in her early 68s   Breast cancer Sister        paternal half sister dx in her late 17s   Breast cancer Sister    Diabetes Brother 51       HALF BROTHER/HALF BROTHER    GI problems Brother    Uterine cancer Maternal Grandmother        dx in her 67s   Cystic fibrosis Other        brother's granddauther   Cystic fibrosis Other        brothers granddaughters   Colon cancer Neg Hx    Esophageal cancer Neg Hx    Rectal cancer Neg Hx    Stomach cancer Neg Hx    Allergic rhinitis Neg Hx    Asthma Neg Hx    Eczema Neg Hx    Urticaria Neg Hx     Social History   Tobacco Use   Smoking status: Never   Smokeless tobacco: Never  Vaping Use   Vaping Use: Never used  Substance Use Topics   Alcohol use: Yes    Alcohol/week: 2.0 standard drinks of alcohol    Types: 2 Glasses of wine per week    Comment: occ   Drug use: No    ROS   Objective:   Vitals: BP 133/85 (BP Location: Right Arm)   Pulse 94   Temp 98.9 F (37.2 C) (Oral)   Resp 20   SpO2 96%   Physical Exam Constitutional:       General: She is not in acute distress.    Appearance: Normal appearance. She is well-developed and normal weight. She is not ill-appearing, toxic-appearing or diaphoretic.  HENT:     Head: Normocephalic and atraumatic.     Right Ear: Tympanic  membrane, ear canal and external ear normal. No drainage or tenderness. No middle ear effusion. There is no impacted cerumen. Tympanic membrane is not erythematous or bulging.     Left Ear: Tympanic membrane, ear canal and external ear normal. No drainage or tenderness.  No middle ear effusion. There is no impacted cerumen. Tympanic membrane is not erythematous or bulging.     Nose: Nose normal. No congestion or rhinorrhea.     Mouth/Throat:     Mouth: Mucous membranes are moist. No oral lesions.     Pharynx: No pharyngeal swelling, oropharyngeal exudate, posterior oropharyngeal erythema or uvula swelling.     Tonsils: No tonsillar exudate or tonsillar abscesses.  Eyes:     General: Lids are normal. Lids are everted, no foreign bodies appreciated. Vision grossly intact. No scleral icterus.       Right eye: No foreign body, discharge or hordeolum.        Left eye: No foreign body, discharge or hordeolum.     Extraocular Movements: Extraocular movements intact.     Right eye: Normal extraocular motion.     Left eye: Normal extraocular motion and no nystagmus.     Conjunctiva/sclera:     Right eye: Right conjunctiva is injected. No chemosis, exudate or hemorrhage.    Left eye: Left conjunctiva is injected. No chemosis, exudate or hemorrhage. Cardiovascular:     Rate and Rhythm: Normal rate and regular rhythm.     Heart sounds: Normal heart sounds. No murmur heard.    No friction rub. No gallop.  Pulmonary:     Effort: Pulmonary effort is normal. No respiratory distress.     Breath sounds: No stridor. No wheezing, rhonchi or rales.  Chest:     Chest wall: No tenderness.  Musculoskeletal:     Cervical back: Normal range of motion and neck supple.   Lymphadenopathy:     Cervical: No cervical adenopathy.  Skin:    General: Skin is warm and dry.  Neurological:     General: No focal deficit present.     Mental Status: She is alert and oriented to person, place, and time.  Psychiatric:        Mood and Affect: Mood normal.        Behavior: Behavior normal.     Assessment and Plan :   PDMP not reviewed this encounter.  1. Acute bacterial sinusitis   2. Bacterial conjunctivitis of both eyes   3. Chronic rhinitis     Suspect the primary issue was a viral upper respiratory illness but has progressed.  Will start empiric treatment for bacterial sinusitis with Augmentin.  This can also address a developing preseptal cellulitis.  Patient has chronic rhinitis and recommended prednisone, hold pseudoephedrine.  Use tobramycin eyedrops as well.  Recommended supportive care otherwise. Counseled patient on potential for adverse effects with medications prescribed/recommended today, ER and return-to-clinic precautions discussed, patient verbalized understanding.    Wallis Bamberg, [place] 02/26/23 [phone]

## 2023-02-26 NOTE — Discharge Instructions (Signed)
We will manage this as a sinus infection with Augmentin. Start prednisone for the acute on chronic rhinitis and sinus inflammation. Use tobramycin eye drops for the eyes.  For sore throat or cough try using a honey-based tea. Use 3 teaspoons of honey with juice squeezed from half lemon. Place shaved pieces of ginger into 1/2-1 cup of water and warm over stove top. Then mix the ingredients and repeat every 4 hours as needed. Please take ibuprofen 600mg  every 6 hours with food alternating with OR taken together with Tylenol 500mg -650mg  every 6 hours for throat pain, fevers, aches and pains. Hydrate very well with at least 2 liters of water. Eat light meals such as soups (chicken and noodles, vegetable, chicken and wild rice).  Do not eat foods that you are allergic to.  Taking an antihistamine like Zyrtec can help against postnasal drainage, sinus congestion which can cause sinus pain, sinus headaches, throat pain, painful swallowing, coughing.  Hold Sudafed while on prednisone but use it thereafter.  Use cough medication as needed.

## 2023-02-27 ENCOUNTER — Telehealth: Payer: Self-pay

## 2023-02-27 NOTE — Telephone Encounter (Signed)
Pt LVM stating she had to cancel DEXA scan for tomorrow d/t illness. However, pt inquiring about having to get imaging done at an external location and timing of imaging.  Please advise if you would like me to offer external location or have pt wait until provider comes and DEXA is available again in office?  Last DEXA 02/16/2021- osteopenia (T-score -1.9)

## 2023-02-28 NOTE — Telephone Encounter (Signed)
Per ML: "Recommend scheduling at [facility] for BD, where she also does her Mammo. Dr Seymour Bars"   LDVM on macine per DPR.   Order filled out and placed on ML's desk for authorization.

## 2023-03-01 NOTE — Telephone Encounter (Signed)
Order was signed and faxed successfully on 02/28/2023.   Pt reports that she is scheduled for her DEXA on Monday 03/06/2023 @ [facility].   Will route to provider for final review and close.

## 2023-03-13 ENCOUNTER — Ambulatory Visit (AMBULATORY_SURGERY_CENTER): Payer: No Typology Code available for payment source

## 2023-03-13 VITALS — Ht 64.5 in | Wt 138.0 lb

## 2023-03-13 DIAGNOSIS — Z1211 Encounter for screening for malignant neoplasm of colon: Secondary | ICD-10-CM

## 2023-03-13 NOTE — Progress Notes (Signed)
No egg or soy allergy known to patient - egg intolerance causes stomach aches  No issues known to pt with past sedation with any surgeries or procedures Patient denies ever being told they had issues or difficulty with intubation  No FH of Malignant Hyperthermia Pt is not on diet pills Pt is not on  home 02  Pt is not on blood thinners  Pt denies issues with constipation  No A fib or A flutter Have any cardiac testing pending--no  LOA: independent  Prep: miralax   Patient's chart reviewed by Cathlyn Parsons CNRA prior to previsit and patient appropriate for the [facility].  Previsit completed and red dot placed by patient's name on their procedure day (on provider's schedule).     PV competed with patient. Prep instructions sent via mychart and home address.

## 2023-03-20 ENCOUNTER — Ambulatory Visit (INDEPENDENT_AMBULATORY_CARE_PROVIDER_SITE_OTHER): Payer: No Typology Code available for payment source

## 2023-03-20 DIAGNOSIS — J309 Allergic rhinitis, unspecified: Secondary | ICD-10-CM | POA: Diagnosis not present

## 2023-04-04 ENCOUNTER — Telehealth: Payer: Self-pay | Admitting: Nurse Practitioner

## 2023-04-04 ENCOUNTER — Encounter: Payer: Self-pay | Admitting: Internal Medicine

## 2023-04-04 NOTE — Telephone Encounter (Signed)
Called and scheduled an appointment for 8/14 @ 10a

## 2023-04-04 NOTE — Telephone Encounter (Signed)
04/04/23 - Pt called saying she tested positive to Covid this morning. She's wanting to know if you can prescribe a medication for her to pick up at the pharmacy to use or if you would want to do a video call or have her come in for an office visit. Pls advise pt on [phone].

## 2023-04-05 ENCOUNTER — Encounter: Payer: No Typology Code available for payment source | Admitting: Internal Medicine

## 2023-04-05 ENCOUNTER — Encounter: Payer: Self-pay | Admitting: Nurse Practitioner

## 2023-04-05 ENCOUNTER — Ambulatory Visit: Payer: No Typology Code available for payment source | Admitting: Nurse Practitioner

## 2023-04-05 VITALS — BP 110/68 | HR 103 | Temp 101.0°F | Resp 16 | Ht 61.5 in | Wt 140.2 lb

## 2023-04-05 DIAGNOSIS — U071 COVID-19: Secondary | ICD-10-CM

## 2023-04-05 LAB — POC COVID19 BINAXNOW: SARS Coronavirus 2 Ag: POSITIVE — AB

## 2023-04-05 MED ORDER — BENZONATATE 100 MG PO CAPS
100.0000 mg | ORAL_CAPSULE | Freq: Three times a day (TID) | ORAL | 0 refills | Status: DC | PRN
Start: 2023-04-05 — End: 2023-05-08

## 2023-04-05 MED ORDER — NIRMATRELVIR/RITONAVIR (PAXLOVID)TABLET
3.0000 | ORAL_TABLET | Freq: Two times a day (BID) | ORAL | 0 refills | Status: AC
Start: 2023-04-05 — End: 2023-04-10

## 2023-04-05 NOTE — Patient Instructions (Signed)

## 2023-04-05 NOTE — Progress Notes (Signed)
Acute Office Visit  Subjective:    Patient ID: CHAZITY BRU, female    DOB: October 03, 1960, 62 y.o.   MRN: [id number]  Chief Complaint  Patient presents with   URI    Pt c/o cough, congestion, fever. Positive home test.    URI  This is a new problem. The current episode started in the past 7 days. The problem has been gradually worsening. The maximum temperature recorded prior to her arrival was 100.4 - 100.9 F. The fever has been present for 1 to 2 days. Associated symptoms include congestion, coughing, headaches, joint pain, rhinorrhea and sinus pain. Pertinent negatives include no abdominal pain, chest pain, diarrhea, dysuria, ear pain, joint swelling, nausea, neck pain, plugged ear sensation, rash, sneezing, sore throat, swollen glands, vomiting or wheezing. She has tried acetaminophen for the symptoms. The treatment provided mild relief.   Outpatient Medications Prior to Visit  Medication Sig   albuterol (VENTOLIN HFA) 108 (90 Base) MCG/ACT inhaler Inhale 2 puffs into the lungs every 6 (six) hours as needed for wheezing or shortness of breath.   buPROPion (WELLBUTRIN XL) 150 MG 24 hr tablet Take 150 mg by mouth 2 (two) times daily.   Calcium Carbonate (CALCIUM 600 PO) Take 1 tablet by mouth daily.   EPINEPHrine 0.3 mg/0.3 mL IJ SOAJ injection Inject 0.3 mg into the muscle as needed for anaphylaxis.   Estradiol (VAGIFEM) 10 MCG TABS vaginal tablet INSERT 1 TABLET VAGINALLY EVERY NIGHT AT BEDTIME FOR 1 WEEK, THEN TWICE WEEKLY AT BEDTIME   Glucosamine-Chondroitin-MSM 1500-1200-500 MG PACK Take 1 tablet by mouth daily.   ketoconazole (NIZORAL) 2 % cream Apply 1 Application topically 2 (two) times daily.   levocetirizine (XYZAL) 5 MG tablet Take 1 tablet (5 mg total) by mouth daily as needed for allergies (Can take an extra dose during flare ups.).   LORazepam (ATIVAN) 0.5 MG tablet Take 0.5 mg by mouth daily as needed for sleep.    MAGNESIUM GLYCINATE PLUS PO Take 2 capsules by mouth at  bedtime.   mometasone (NASONEX) 50 MCG/ACT nasal spray Place 2 sprays into the nose in the morning and at bedtime.   montelukast (SINGULAIR) 10 MG tablet Take 1 tablet (10 mg total) by mouth at bedtime.   Multiple Vitamin (MULTIVITAMIN) tablet Take 1 tablet by mouth daily.   olopatadine (PATANOL) 0.1 % ophthalmic solution Place 1 drop into both eyes 2 (two) times daily.   Omega-3 Fatty Acids (OMEGAPURE 780 EC PO) Take 1 tablet by mouth daily.   promethazine-dextromethorphan (PROMETHAZINE-DM) 6.25-15 MG/5ML syrup Take 5 mLs by mouth at bedtime as needed for cough.   tobramycin (TOBREX) 0.3 % ophthalmic solution Place 1 drop into both eyes every 4 (four) hours.   amoxicillin-clavulanate (AUGMENTIN) 875-125 MG tablet Take 1 tablet by mouth 2 (two) times daily. (Patient not taking: Reported on 03/13/2023)   predniSONE (DELTASONE) 20 MG tablet Take 2 tablets daily with breakfast. (Patient not taking: Reported on 03/13/2023)   No facility-administered medications prior to visit.    Reviewed past medical and social history.  Review of Systems  HENT:  Positive for congestion, rhinorrhea and sinus pain. Negative for ear pain, sneezing and sore throat.   Respiratory:  Positive for cough. Negative for wheezing.   Cardiovascular:  Negative for chest pain.  Gastrointestinal:  Negative for abdominal pain, diarrhea, nausea and vomiting.  Genitourinary:  Negative for dysuria.  Musculoskeletal:  Positive for joint pain. Negative for neck pain.  Skin:  Negative for rash.  Neurological:  Positive for headaches.   Per HPI     Objective:    Physical Exam Constitutional:      General: She is not in acute distress. HENT:     Right Ear: Tympanic membrane, ear canal and external ear normal.     Left Ear: Tympanic membrane, ear canal and external ear normal.     Nose: No nasal tenderness or mucosal edema.     Right Nostril: No occlusion.     Left Nostril: No occlusion.     Right Turbinates: Not enlarged,  swollen or pale.     Left Turbinates: Not enlarged, swollen or pale.     Right Sinus: No maxillary sinus tenderness or frontal sinus tenderness.     Left Sinus: No maxillary sinus tenderness or frontal sinus tenderness.     Mouth/Throat:     Pharynx: Uvula midline.     Tonsils: No tonsillar exudate or tonsillar abscesses.  Eyes:     Extraocular Movements: Extraocular movements intact.     Conjunctiva/sclera: Conjunctivae normal.  Cardiovascular:     Rate and Rhythm: Normal rate and regular rhythm.     Pulses: Normal pulses.     Heart sounds: Normal heart sounds.  Pulmonary:     Effort: Pulmonary effort is normal.     Breath sounds: Normal breath sounds.  Musculoskeletal:     Cervical back: Normal range of motion and neck supple.  Lymphadenopathy:     Cervical: No cervical adenopathy.  Neurological:     Mental Status: She is alert and oriented to person, place, and time.     BP 110/68 (BP Location: Left Arm, Patient Position: Sitting, Cuff Size: Normal)   Pulse (!) 103   Temp (!) 101 F (38.3 C) (Oral)   Resp 16   Ht 5' 1.5" (1.562 m)   Wt 140 lb 3.2 oz (63.6 kg)   SpO2 98%   BMI 26.06 kg/m    Results for orders placed or performed in visit on 04/05/23  POC COVID-19 BinaxNow  Result Value Ref Range   SARS Coronavirus 2 Ag Positive (A) Negative      Assessment & Plan:   Problem List Items Addressed This Visit   None Visit Diagnoses     COVID-19    -  Primary   Relevant Medications   nirmatrelvir/ritonavir (PAXLOVID) 20 x 150 MG & 10 x 100MG  TABS   benzonatate (TESSALON) 100 MG capsule   Other Relevant Orders   POC COVID-19 BinaxNow (Completed)      Meds ordered this encounter  Medications   nirmatrelvir/ritonavir (PAXLOVID) 20 x 150 MG & 10 x 100MG  TABS    Sig: Take 3 tablets by mouth 2 (two) times daily for 5 days. (Take nirmatrelvir 150 mg two tablets twice daily for 5 days and ritonavir 100 mg one tablet twice daily for 5 days) Patient GFR is 88.77     Dispense:  30 tablet    Refill:  0    Order Specific Question:   Supervising Provider    Answer:   Nadene Rubins ALFRED [[id number]]   benzonatate (TESSALON) 100 MG capsule    Sig: Take 1-2 capsules (100-200 mg total) by mouth 3 (three) times daily as needed.    Dispense:  21 capsule    Refill:  0    Order Specific Question:   Supervising Provider    Answer:   Mliss Sax [[id number]]   Return if symptoms worsen or fail to improve.  Claris Gower  Edwyn Inclan, NP

## 2023-04-12 ENCOUNTER — Ambulatory Visit: Payer: Self-pay

## 2023-04-17 ENCOUNTER — Ambulatory Visit (INDEPENDENT_AMBULATORY_CARE_PROVIDER_SITE_OTHER): Payer: No Typology Code available for payment source | Admitting: *Deleted

## 2023-04-17 DIAGNOSIS — J309 Allergic rhinitis, unspecified: Secondary | ICD-10-CM | POA: Diagnosis not present

## 2023-04-20 ENCOUNTER — Encounter: Payer: No Typology Code available for payment source | Admitting: Internal Medicine

## 2023-04-20 DIAGNOSIS — J302 Other seasonal allergic rhinitis: Secondary | ICD-10-CM

## 2023-04-20 NOTE — Progress Notes (Signed)
VIALS EXP 04-19-24

## 2023-04-21 ENCOUNTER — Telehealth: Payer: Self-pay | Admitting: Nurse Practitioner

## 2023-04-21 NOTE — Telephone Encounter (Signed)
Pt called and said she need a referral to an orto dr. For her shoulder. Pt also mentioned that she would like to see dr Frazier Butt at [facility] ortho

## 2023-04-27 ENCOUNTER — Encounter: Payer: Self-pay | Admitting: Nurse Practitioner

## 2023-04-27 ENCOUNTER — Ambulatory Visit (INDEPENDENT_AMBULATORY_CARE_PROVIDER_SITE_OTHER): Payer: No Typology Code available for payment source | Admitting: Nurse Practitioner

## 2023-04-27 VITALS — BP 122/80 | HR 92 | Temp 98.3°F | Ht 61.5 in | Wt 138.4 lb

## 2023-04-27 DIAGNOSIS — J0110 Acute frontal sinusitis, unspecified: Secondary | ICD-10-CM | POA: Diagnosis not present

## 2023-04-27 MED ORDER — AMOXICILLIN-POT CLAVULANATE 875-125 MG PO TABS: 1.0000 | ORAL_TABLET | Freq: Two times a day (BID) | ORAL | 0 refills | Status: DC

## 2023-04-27 MED ORDER — HYDROCOD POLI-CHLORPHE POLI ER 10-8 MG/5ML PO SUER: 5.0000 mL | Freq: Two times a day (BID) | ORAL | 0 refills | Status: DC | PRN

## 2023-04-27 NOTE — Progress Notes (Signed)
Acute Office Visit  Subjective:     Patient ID: Yolanda [name], female    DOB: 12-14-1960, 62 y.o.   MRN: [id number]  Chief Complaint  Patient presents with   Cough    With congestion for 1 week, had Covid 2 weeks ago   HPI:  Patient is in today for cough and congestion for 1 week. She was diagnosed with covid-19 on 04/05/23 and treated with paxlovid and tessalon capsules as needed for cough. She states that it cleared up, and then got sick again 7-8 days later. She thought it may be rebound from covid. She took another test last week, and it was positive. She denies fevers, shortness of breath and ear pain. She has a sore throat from the coughing. The cough is productive and chest tightness. She endorses sinus pressure and fatigue. She has tried tessalon, sudafed, Careers adviser, mucinex, nasonex, albuterol inhaler, aleve, singulair. She starts to feel better for a few hours, and then gets worse again.   ROS See pertinent positives and negatives per HPI.     Objective:    BP 122/80 (BP Location: Left Arm)   Pulse 92   Temp 98.3 F (36.8 C) (Oral)   Ht 5' 1.5" (1.562 m)   Wt 138 lb 6.4 oz (62.8 kg)   SpO2 99%   BMI 25.73 kg/m    Physical Exam Vitals and nursing note reviewed.  Constitutional:      General: She is not in acute distress.    Appearance: Normal appearance.  HENT:     Head: Normocephalic.     Right Ear: Tympanic membrane, ear canal and external ear normal.     Left Ear: Tympanic membrane, ear canal and external ear normal.     Nose: Congestion present.     Right Sinus: Frontal sinus tenderness present. No maxillary sinus tenderness.     Left Sinus: Frontal sinus tenderness present. No maxillary sinus tenderness.     Mouth/Throat:     Mouth: Mucous membranes are moist.     Pharynx: Posterior oropharyngeal erythema present. No oropharyngeal exudate.  Eyes:     Conjunctiva/sclera: Conjunctivae normal.  Cardiovascular:     Rate and Rhythm: Normal rate and regular  rhythm.     Pulses: Normal pulses.     Heart sounds: Normal heart sounds.  Pulmonary:     Effort: Pulmonary effort is normal.     Breath sounds: Normal breath sounds.  Musculoskeletal:     Cervical back: Normal range of motion and neck supple. No tenderness.  Lymphadenopathy:     Cervical: No cervical adenopathy.  Skin:    General: Skin is warm.  Neurological:     General: No focal deficit present.     Mental Status: She is alert and oriented to person, place, and time.  Psychiatric:        Mood and Affect: Mood normal.        Behavior: Behavior normal.        Thought Content: Thought content normal.        Judgment: Judgment normal.      Assessment & Plan:   Problem List Items Addressed This Visit   None Visit Diagnoses     Acute non-recurrent frontal sinusitis    -  Primary   Start augmentin BID x7 days. Tussionex q12 hr prn cough. PDMP reviewed. Encourage fluids, rest. Can continue OTC meds. F/U if not improving   Relevant Medications   guaiFENesin (MUCINEX PO)   chlorpheniramine-HYDROcodone (  TUSSIONEX) 10-8 MG/5ML   amoxicillin-clavulanate (AUGMENTIN) 875-125 MG tablet       Meds ordered this encounter  Medications   chlorpheniramine-HYDROcodone (TUSSIONEX) 10-8 MG/5ML    Sig: Take 5 mLs by mouth every 12 (twelve) hours as needed for cough.    Dispense:  70 mL    Refill:  0   amoxicillin-clavulanate (AUGMENTIN) 875-125 MG tablet    Sig: Take 1 tablet by mouth 2 (two) times daily.    Dispense:  14 tablet    Refill:  0    Return if symptoms worsen or fail to improve.  Gerre Scull, NP

## 2023-04-27 NOTE — Patient Instructions (Signed)
It was great to see you!  Start aumgentin twice a day for 7 days   I have sent in some different cough syrup to take at night   You can still take the tessalon perles during the day and your mucinex or sudafed  Drink lots of fluid and get rest.   Let's follow-up if your symptoms worsen or any concerns.   Take care,  Rodman Pickle, NP

## 2023-05-01 ENCOUNTER — Encounter (INDEPENDENT_AMBULATORY_CARE_PROVIDER_SITE_OTHER): Payer: No Typology Code available for payment source | Admitting: Nurse Practitioner

## 2023-05-01 DIAGNOSIS — M25511 Pain in right shoulder: Secondary | ICD-10-CM | POA: Diagnosis not present

## 2023-05-01 NOTE — Telephone Encounter (Signed)
I called and spoke to pt. She stated a gentleman called her last week but she didn't understand the need for an appointment. Pt mentioned discussion with you via MyChart messages today. I have confirmed her request for [facility] and sent referral.

## 2023-05-01 NOTE — Telephone Encounter (Signed)
Please see the MyChart message reply(ies) for my assessment and plan.  The patient gave consent for this Medical Advice Message and is aware that it may result in a bill to their insurance company as well as the possibility that this may result in a co-payment or deductible. They are an established patient, but are not seeking medical advice exclusively about a problem treated during an in person or video visit in the last 7 days. I did not recommend an in person or video visit within 7 days of my reply.  I spent a total of 10 minutes cumulative time within 7 days through MyChart messaging Charlotte Nche, NP  

## 2023-05-01 NOTE — Addendum Note (Signed)
Addended by: Alysia Penna L on: 05/01/2023 02:21 PM   Modules accepted: Orders

## 2023-05-08 ENCOUNTER — Ambulatory Visit: Payer: No Typology Code available for payment source

## 2023-05-08 ENCOUNTER — Other Ambulatory Visit: Payer: Self-pay

## 2023-05-08 VITALS — Ht 61.5 in | Wt 138.0 lb

## 2023-05-08 DIAGNOSIS — Z1211 Encounter for screening for malignant neoplasm of colon: Secondary | ICD-10-CM

## 2023-05-08 NOTE — Progress Notes (Signed)
Denies allergies to eggs or soy products. Denies complication of anesthesia or sedation. Denies use of weight loss medication. Denies use of O2.   Emmi instructions given for colonoscopy. 

## 2023-05-15 ENCOUNTER — Ambulatory Visit (INDEPENDENT_AMBULATORY_CARE_PROVIDER_SITE_OTHER): Payer: No Typology Code available for payment source | Admitting: *Deleted

## 2023-05-15 DIAGNOSIS — J309 Allergic rhinitis, unspecified: Secondary | ICD-10-CM | POA: Diagnosis not present

## 2023-06-07 ENCOUNTER — Encounter: Payer: No Typology Code available for payment source | Admitting: Internal Medicine

## 2023-06-09 ENCOUNTER — Encounter: Payer: Self-pay | Admitting: Internal Medicine

## 2023-06-12 ENCOUNTER — Ambulatory Visit (INDEPENDENT_AMBULATORY_CARE_PROVIDER_SITE_OTHER): Payer: No Typology Code available for payment source

## 2023-06-12 DIAGNOSIS — J309 Allergic rhinitis, unspecified: Secondary | ICD-10-CM

## 2023-06-15 ENCOUNTER — Telehealth: Payer: Self-pay

## 2023-06-15 DIAGNOSIS — Z803 Family history of malignant neoplasm of breast: Secondary | ICD-10-CM

## 2023-06-15 DIAGNOSIS — Z9189 Other specified personal risk factors, not elsewhere classified: Secondary | ICD-10-CM

## 2023-06-15 NOTE — Telephone Encounter (Signed)
Pt LVM in triage line stating she received a recall letter in the mail about scheduling a procedure. States she thinks it could be for her Breast MRI (supplemental Br Ca screening).

## 2023-06-16 NOTE — Telephone Encounter (Signed)
Former JJ pt.   Last AEX 11/24/2022-JJ, recall placed for 11/2022.  Last mammo 01/23/2023-neg birads 1; Cat C  Ok to schedule Breast MRI for 07/2023?

## 2023-06-19 ENCOUNTER — Ambulatory Visit (INDEPENDENT_AMBULATORY_CARE_PROVIDER_SITE_OTHER): Payer: Self-pay

## 2023-06-19 DIAGNOSIS — J309 Allergic rhinitis, unspecified: Secondary | ICD-10-CM | POA: Diagnosis not present

## 2023-06-19 NOTE — Telephone Encounter (Signed)
Please schedule breast MRI with and without contrast for patient for December, 2024    She has increased risk of breast cancer 23.7%.

## 2023-06-19 NOTE — Telephone Encounter (Signed)
Patient notified. Order placed. Patient will call [facility] to schedule for 07/2023. MyChart message sent with contact information. Patient verbalizes understanding and is agreeable.   Encounter closed.

## 2023-06-20 NOTE — Progress Notes (Unsigned)
[facility] History and Physical   Primary Care Physician:  Anne Ng, NP   Reason for Procedure:   CRCA screening  Plan:    colonoscopy     HPI: Yolanda [name] is a 62 y.o. female here for screening exam. Negative colonoscopy 2014   Past Medical History:  Diagnosis Date   Acute appendicitis 09/14/2018   Allergy    Anemia    Anxiety    Asthma    BRCA negative 12/2014   Depression    Endometriosis    Esophagitis    Fibroid    GERD (gastroesophageal reflux disease)    Hiatal hernia    History of diverticulitis    IC (interstitial cystitis)    Osteoporosis 01/2019   2018 T score -2.6, 2020 T score -2.0 on Actonel   Pain in the coccyx 12/04/2018   Schatzki's ring     Past Surgical History:  Procedure Laterality Date   CHOLECYSTECTOMY     HYSTEROSCOPY  12/2003   RESECTON OF SUBMUCOUS  MYOMA/DX SCOPE   LAPAROSCOPIC APPENDECTOMY N/A 09/14/2018   Procedure: APPENDECTOMY LAPAROSCOPIC;  Surgeon: Andria Meuse, MD;  Location: [facility] OR;  Service: General;  Laterality: N/A;   LAPAROSCOPIC BILATERAL SALPINGO OOPHERECTOMY Bilateral 07/02/2014   Procedure: LAPAROSCOPIC BILATERAL SALPINGO OOPHORECTOMY;  Surgeon: Dara Lords, MD;  Location: [facility]S;  Service: Gynecology;  Laterality: Bilateral;   PELVIC LAPAROSCOPY  12/2003   DX SCOPE/RESECTION OF SUBMUCOUS MYOMA   TMJ ARTHROSCOPY  11-26-2001   ULNAR TUNNEL RELEASE Right 06/01/2022   Procedure: CUBITAL TUNNEL RELEASE WITH POSSIBLE ANTERIOR TRANSPOSITION;  Surgeon: Marlyne Beards, MD;  Location: [facility];  Service: Orthopedics;  Laterality: Right;  62   VAGINAL HYSTERECTOMY  12/05   Menorrhagia/dysmenorrhea. Pathology showed leiomyoma/adenomyosis    Prior to Admission medications   Medication Sig Start Date End Date Taking? Authorizing Provider  albuterol (VENTOLIN HFA) 108 (90 Base) MCG/ACT inhaler Inhale 2 puffs into the lungs every 6 (six) hours as needed for wheezing or  shortness of breath. 11/29/22   Kozlow, Alvira Philips, MD  buPROPion (WELLBUTRIN XL) 150 MG 24 hr tablet Take 150 mg by mouth 2 (two) times daily. 10/23/20   [provider]  Calcium Carbonate (CALCIUM 600 PO) Take 1 tablet by mouth daily.    [provider]  chlorpheniramine-HYDROcodone (TUSSIONEX) 10-8 MG/5ML Take 5 mLs by mouth every 12 (twelve) hours as needed for cough. Patient not taking: Reported on 05/08/2023 04/27/23   Rodman Pickle A, NP  EPINEPHrine 0.3 mg/0.3 mL IJ SOAJ injection Inject 0.3 mg into the muscle as needed for anaphylaxis. 12/01/21   Kozlow, Alvira Philips, MD  Estradiol (VAGIFEM) 10 MCG TABS vaginal tablet INSERT 1 TABLET VAGINALLY EVERY NIGHT AT BEDTIME FOR 1 WEEK, THEN TWICE WEEKLY AT BEDTIME 11/24/22   Romualdo Bolk, MD  Glucosamine-Chondroitin-MSM 1500-1200-500 MG PACK Take 1 tablet by mouth daily.    [provider]  guaiFENesin (MUCINEX PO) Take by mouth as needed. Patient not taking: Reported on 05/08/2023    [provider]  LORazepam (ATIVAN) 0.5 MG tablet Take 0.5 mg by mouth daily as needed for sleep.     [provider]  MAGNESIUM GLYCINATE PLUS PO Take 2 capsules by mouth at bedtime.    [provider]  mometasone (NASONEX) 50 MCG/ACT nasal spray Place 2 sprays into the nose in the morning and at bedtime. 11/29/22   Kozlow, Alvira Philips, MD  montelukast (SINGULAIR) 10 MG tablet Take 1  tablet (10 mg total) by mouth at bedtime. 11/29/22   Kozlow, Alvira Philips, MD  Multiple Vitamin (MULTIVITAMIN) tablet Take 1 tablet by mouth daily.    [provider]  olopatadine (PATANOL) 0.1 % ophthalmic solution Place 1 drop into both eyes 2 (two) times daily. 11/29/22   Kozlow, Alvira Philips, MD  Omega-3 Fatty Acids (OMEGAPURE 780 EC PO) Take 1 tablet by mouth daily.    [provider]  polyethylene glycol powder (GLYCOLAX/MIRALAX) 17 GM/SCOOP powder Take 1 Container by mouth once.    [provider]    Current Outpatient Medications   Medication Sig Dispense Refill   buPROPion (WELLBUTRIN XL) 150 MG 24 hr tablet Take 150 mg by mouth 2 (two) times daily.     Calcium Carbonate (CALCIUM 600 PO) Take 1 tablet by mouth daily.     Glucosamine-Chondroitin-MSM 1500-1200-500 MG PACK Take 1 tablet by mouth daily.     LORazepam (ATIVAN) 0.5 MG tablet Take 0.5 mg by mouth daily as needed for sleep.      MAGNESIUM GLYCINATE PLUS PO Take 2 capsules by mouth at bedtime.     mometasone (NASONEX) 50 MCG/ACT nasal spray Place 2 sprays into the nose in the morning and at bedtime. 51 g 3   montelukast (SINGULAIR) 10 MG tablet Take 1 tablet (10 mg total) by mouth at bedtime. 90 tablet 3   Multiple Vitamin (MULTIVITAMIN) tablet Take 1 tablet by mouth daily.     Omega-3 Fatty Acids (OMEGAPURE 780 EC PO) Take 1 tablet by mouth daily.     albuterol (VENTOLIN HFA) 108 (90 Base) MCG/ACT inhaler Inhale 2 puffs into the lungs every 6 (six) hours as needed for wheezing or shortness of breath. 18 g 1   EPINEPHrine 0.3 mg/0.3 mL IJ SOAJ injection Inject 0.3 mg into the muscle as needed for anaphylaxis. 2 each 2   Estradiol (VAGIFEM) 10 MCG TABS vaginal tablet INSERT 1 TABLET VAGINALLY EVERY NIGHT AT BEDTIME FOR 1 WEEK, THEN TWICE WEEKLY AT BEDTIME 24 tablet 3   olopatadine (PATANOL) 0.1 % ophthalmic solution Place 1 drop into both eyes 2 (two) times daily. 15 mL 3   Current Facility-Administered Medications  Medication Dose Route Frequency Provider Last Rate Last Admin   0.9 %  sodium chloride infusion  500 mL Intravenous Once Iva Boop, MD        Allergies as of 06/21/2023 - Review Complete 06/21/2023  Allergen Reaction Noted   Honey bee treatment [bee venom] Anaphylaxis 03/20/2018   Egg-derived products  07/22/2015   Methocarbamol  09/12/2019   Peanut-containing drug products Other (See Comments) 09/14/2018   Sesame seed (diagnostic) Other (See Comments) 09/14/2018   Soy allergy Other (See Comments) 09/14/2018   Wheat  03/20/2018     Family History  Problem Relation Age of Onset   Depression Mother    Hyperlipidemia Mother    Hypertension Mother    Mental illness Mother    Miscarriages / [place] Mother    Pulmonary fibrosis Mother        possible textile mil exposure   Emphysema Father    Early death Father    Breast cancer Sister        paternal half sister dx in her early 41s   Breast cancer Sister        paternal half sister dx in her late 34s   Breast cancer Sister    Diabetes Brother 26       HALF BROTHER/HALF BROTHER  GI problems Brother    Uterine cancer Maternal Grandmother        dx in her 37s   Cystic fibrosis Other        brother's granddauther   Cystic fibrosis Other        brothers granddaughters   Colon cancer Neg Hx    Esophageal cancer Neg Hx    Rectal cancer Neg Hx    Stomach cancer Neg Hx    Allergic rhinitis Neg Hx    Asthma Neg Hx    Eczema Neg Hx    Urticaria Neg Hx    Colon polyps Neg Hx     Social History   Socioeconomic History   Marital status: Married    Spouse name: Not on file   Number of children: 2   Years of education: Not on file   Highest education level: Not on file  Occupational History   Not on file  Tobacco Use   Smoking status: Never   Smokeless tobacco: Never  Vaping Use   Vaping status: Never Used  Substance and Sexual Activity   Alcohol use: Yes    Alcohol/week: 2.0 standard drinks of alcohol    Types: 2 Glasses of wine per week    Comment: occ   Drug use: No   Sexual activity: Yes    Birth control/protection: Surgical    Comment: HYST-1st intercourse 62 yo-Fewer than 5 partners  Other Topics Concern   Not on file  Social History Narrative   Not on file   Social Determinants of Health   Financial Resource Strain: Not on file  Food Insecurity: Not on file  Transportation Needs: Not on file  Physical Activity: Not on file  Stress: Not on file  Social Connections: Not on file  Intimate Partner Violence: Not on file     Review of Systems: Fracture left 5th toe All other review of systems negative except as mentioned in the HPI.  Physical Exam: Vital signs BP 129/71   Pulse 86   Temp 98.6 F (37 C)   Ht 5' 1.5" (1.562 m)   Wt 137 lb (62.1 kg)   SpO2 100%   BMI 25.47 kg/m   General:   Alert,  Well-developed, well-nourished, pleasant and cooperative in NAD Lungs:  Clear throughout to auscultation.   Heart:  Regular rate and rhythm; no murmurs, clicks, rubs,  or gallops. Abdomen:  Soft, nontender and nondistended. Normal bowel sounds.   Neuro/Psych:  Alert and cooperative. Normal mood and affect. A and O x 3   @Kimberleigh Mehan  Sena Slate, MD, [facility] [phone] (pager) 06/21/2023 9:43 AM@

## 2023-06-21 ENCOUNTER — Ambulatory Visit (AMBULATORY_SURGERY_CENTER): Payer: No Typology Code available for payment source | Admitting: Internal Medicine

## 2023-06-21 ENCOUNTER — Encounter: Payer: Self-pay | Admitting: Internal Medicine

## 2023-06-21 VITALS — BP 122/67 | HR 69 | Temp 98.6°F | Resp 12 | Ht 61.5 in | Wt 137.0 lb

## 2023-06-21 DIAGNOSIS — Z1211 Encounter for screening for malignant neoplasm of colon: Secondary | ICD-10-CM

## 2023-06-21 MED ORDER — SODIUM CHLORIDE 0.9 % IV SOLN
500.0000 mL | Freq: Once | INTRAVENOUS | Status: DC
Start: 2023-06-21 — End: 2023-06-21

## 2023-06-21 NOTE — Progress Notes (Signed)
Sedate, gd SR, tolerated procedure well, VSS, report to RN 

## 2023-06-21 NOTE — Patient Instructions (Addendum)
No polyps and no cancer seen - normal colonoscopy.  Next routine colonoscopy or other screening test in 10 years - 2034.  I appreciate the opportunity to care for you. Iva Boop, MD, [facility]  Resume previous diet.  Continue present medications.  Repeat colonoscopy in 10 years for screening purposes.   YOU HAD AN ENDOSCOPIC PROCEDURE TODAY AT THE [facility]:   Refer to the procedure report that was given to you for any specific questions about what was found during the examination.  If the procedure report does not answer your questions, please call your gastroenterologist to clarify.  If you requested that your care partner not be given the details of your procedure findings, then the procedure report has been included in a sealed envelope for you to review at your convenience later.  YOU SHOULD EXPECT: Some feelings of bloating in the abdomen. Passage of more gas than usual.  Walking can help get rid of the air that was put into your GI tract during the procedure and reduce the bloating. If you had a lower endoscopy (such as a colonoscopy or flexible sigmoidoscopy) you may notice spotting of blood in your stool or on the toilet paper. If you underwent a bowel prep for your procedure, you may not have a normal bowel movement for a few days.  Please Note:  You might notice some irritation and congestion in your nose or some drainage.  This is from the oxygen used during your procedure.  There is no need for concern and it should clear up in a day or so.  SYMPTOMS TO REPORT IMMEDIATELY:  Following lower endoscopy (colonoscopy or flexible sigmoidoscopy):  Excessive amounts of blood in the stool  Significant tenderness or worsening of abdominal pains  Swelling of the abdomen that is new, acute  Fever of 100F or higher  For urgent or emergent issues, a gastroenterologist can be reached at any hour by calling [phone]. Do not use MyChart messaging for urgent concerns.     DIET:  We do recommend a small meal at first, but then you may proceed to your regular diet.  Drink plenty of fluids but you should avoid alcoholic beverages for 24 hours.  ACTIVITY:  You should plan to take it easy for the rest of today and you should NOT DRIVE or use heavy machinery until tomorrow (because of the sedation medicines used during the test).    FOLLOW UP: Our staff will call the number listed on your records the next business day following your procedure.  We will call around 7:15- 8:00 am to check on you and address any questions or concerns that you may have regarding the information given to you following your procedure. If we do not reach you, we will leave a message.     If any biopsies were taken you will be contacted by phone or by letter within the next 1-3 weeks.  Please call us at [phone] if you have not heard about the biopsies in 3 weeks.    SIGNATURES/CONFIDENTIALITY: You and/or your care partner have signed paperwork which will be entered into your electronic medical record.  These signatures attest to the fact that that the information above on your After Visit Summary has been reviewed and is understood.  Full responsibility of the confidentiality of this discharge information lies with you and/or your care-partner.

## 2023-06-21 NOTE — Op Note (Signed)
[facility] Patient Name: Yolanda [name] Procedure Date: 06/21/2023 9:54 AM MRN: [id number] Endoscopist: Iva Boop , MD, [id number] Age: 62 Referring MD:  Date of Birth: 1961/02/17 Gender: Female Account #: [id number] Procedure:                Colonoscopy Indications:              Screening for colorectal malignant neoplasm, Last                            colonoscopy: 2014 Medicines:                Monitored Anesthesia Care Procedure:                Pre-Anesthesia Assessment:                           - Prior to the procedure, a History and Physical                            was performed, and patient medications and                            allergies were reviewed. The patient's tolerance of                            previous anesthesia was also reviewed. The risks                            and benefits of the procedure and the sedation                            options and risks were discussed with the patient.                            All questions were answered, and informed consent                            was obtained. Prior Anticoagulants: The patient has                            taken no anticoagulant or antiplatelet agents. ASA                            Grade Assessment: II - A patient with mild systemic                            disease. After reviewing the risks and benefits,                            the patient was deemed in satisfactory condition to                            undergo the procedure.  After obtaining informed consent, the colonoscope                            was passed under direct vision. Throughout the                            procedure, the patient's blood pressure, pulse, and                            oxygen saturations were monitored continuously. The                            Olympus Scope SN: [id number] was introduced through                            the anus and advanced to the the cecum,  identified                            by appendiceal orifice and ileocecal valve. The                            colonoscopy was performed without difficulty. The                            patient tolerated the procedure well. The quality                            of the bowel preparation was good. The ileocecal                            valve, appendiceal orifice, and rectum were                            photographed. The bowel preparation used was                            Miralax via split dose instruction. Scope In: 9:58:29 AM Scope Out: 10:14:39 AM Scope Withdrawal Time: 0 hours 12 minutes 14 seconds  Total Procedure Duration: 0 hours 16 minutes 10 seconds  Findings:                 The perianal and digital rectal examinations were                            normal.                           The entire examined colon appeared normal.                            Attempted but unable to perform rectal retroflexion. Complications:            No immediate complications. Estimated Blood Loss:     Estimated blood loss: none. Impression:               -  The entire examined colon is normal.                           - No specimens collected. Recommendation:           - Patient has a contact number available for                            emergencies. The signs and symptoms of potential                            delayed complications were discussed with the                            patient. Return to normal activities tomorrow.                            Written discharge instructions were provided to the                            patient.                           - Resume previous diet.                           - Continue present medications.                           - Repeat colonoscopy in 10 years for screening                            purposes. Iva Boop, MD 06/21/2023 10:19:18 AM This report has been signed electronically.

## 2023-06-22 ENCOUNTER — Telehealth: Payer: Self-pay | Admitting: *Deleted

## 2023-06-22 NOTE — Telephone Encounter (Signed)
No answer on  follow up call. Left message.   

## 2023-06-23 ENCOUNTER — Encounter: Payer: No Typology Code available for payment source | Admitting: Nurse Practitioner

## 2023-06-26 ENCOUNTER — Ambulatory Visit (INDEPENDENT_AMBULATORY_CARE_PROVIDER_SITE_OTHER): Payer: No Typology Code available for payment source | Admitting: *Deleted

## 2023-06-26 ENCOUNTER — Ambulatory Visit (INDEPENDENT_AMBULATORY_CARE_PROVIDER_SITE_OTHER): Payer: No Typology Code available for payment source | Admitting: Nurse Practitioner

## 2023-06-26 ENCOUNTER — Encounter: Payer: Self-pay | Admitting: Nurse Practitioner

## 2023-06-26 VITALS — BP 118/74 | HR 70 | Temp 98.1°F | Resp 18 | Ht 61.0 in | Wt 138.2 lb

## 2023-06-26 DIAGNOSIS — H8113 Benign paroxysmal vertigo, bilateral: Secondary | ICD-10-CM | POA: Insufficient documentation

## 2023-06-26 DIAGNOSIS — J309 Allergic rhinitis, unspecified: Secondary | ICD-10-CM

## 2023-06-26 DIAGNOSIS — Z780 Asymptomatic menopausal state: Secondary | ICD-10-CM

## 2023-06-26 DIAGNOSIS — Z13220 Encounter for screening for lipoid disorders: Secondary | ICD-10-CM

## 2023-06-26 DIAGNOSIS — F3342 Major depressive disorder, recurrent, in full remission: Secondary | ICD-10-CM

## 2023-06-26 DIAGNOSIS — Z136 Encounter for screening for cardiovascular disorders: Secondary | ICD-10-CM

## 2023-06-26 DIAGNOSIS — M8580 Other specified disorders of bone density and structure, unspecified site: Secondary | ICD-10-CM | POA: Diagnosis not present

## 2023-06-26 DIAGNOSIS — Z0001 Encounter for general adult medical examination with abnormal findings: Secondary | ICD-10-CM | POA: Diagnosis not present

## 2023-06-26 DIAGNOSIS — Z87828 Personal history of other (healed) physical injury and trauma: Secondary | ICD-10-CM | POA: Insufficient documentation

## 2023-06-26 LAB — COMPREHENSIVE METABOLIC PANEL
ALT: 17 U/L (ref 0–35)
AST: 18 U/L (ref 0–37)
Albumin: 4.4 g/dL (ref 3.5–5.2)
Alkaline Phosphatase: 106 U/L (ref 39–117)
BUN: 14 mg/dL (ref 6–23)
CO2: 31 meq/L (ref 19–32)
Calcium: 9.6 mg/dL (ref 8.4–10.5)
Chloride: 102 meq/L (ref 96–112)
Creatinine, Ser: 0.75 mg/dL (ref 0.40–1.20)
GFR: 86.74 mL/min (ref >60.00)
Glucose, Bld: 79 mg/dL (ref 70–99)
Potassium: 4.4 meq/L (ref 3.5–5.1)
Sodium: 141 meq/L (ref 135–145)
Total Bilirubin: 0.4 mg/dL (ref 0.2–1.2)
Total Protein: 7.2 g/dL (ref 6.0–8.3)

## 2023-06-26 LAB — LIPID PANEL
Cholesterol: 143 mg/dL (ref 0–200)
HDL: 62.6 mg/dL (ref >39.00)
LDL Cholesterol: 69 mg/dL (ref 0–99)
NonHDL: 79.94
Total CHOL/HDL Ratio: 2
Triglycerides: 55 mg/dL (ref 0.0–149.0)
VLDL: 11 mg/dL (ref 0.0–40.0)

## 2023-06-26 NOTE — Assessment & Plan Note (Signed)
Chronic, onset 2010 after head injury without skull fracture. Worse in last 48month, occurs daily, each episode last only a few seconds. trigger-hyperextension on neck or supine position or rapid head movement. denies any nausea or tinnitus or paresthesia, or hearing loss or syncope No coffee, drinks tea-2cups ALCOHOL: 2drinks weekly-wine or mixed drink. H2O: 60-80oz daily.  Provided home vertigo exercise. Consider ENT and/or vestibular rehab referral if no improvement.

## 2023-06-26 NOTE — Progress Notes (Signed)
Complete physical exam  Patient: Yolanda [name]   DOB: [birth year]   62 y.o. Female  MRN: [id number] Visit Date: 06/26/2023  Subjective:    Chief Complaint  Patient presents with   Annual Exam    PT is here for annual exam. PT is due for cervical screening.    Yolanda [name] is a 62 y.o. female who presents today for a complete physical exam. She reports consuming a low fat diet.  Walking and tennis  She generally feels well. She reports sleeping well. She does have additional problems to discuss today.  Vision:No Dental:Yes STD Screen:No Plans to schedule appointment with GYN for pelvic exam.  BP Readings from Last 3 Encounters:  06/26/23 118/74  06/21/23 122/67  04/27/23 122/80   Wt Readings from Last 3 Encounters:  06/26/23 138 lb 3.2 oz (62.7 kg)  06/21/23 137 lb (62.1 kg)  05/08/23 138 lb (62.6 kg)   Most recent fall risk assessment:    06/26/2023    8:34 AM  Fall Risk   Falls in the past year? 0  Number falls in past yr: 0  Injury with Fall? 0  Risk for fall due to : No Fall Risks  Follow up Falls evaluation completed   Depression screen:Yes - Depression Most recent depression screenings:    06/26/2023    8:34 AM 02/21/2023    8:25 AM  PHQ 2/9 Scores  PHQ - 2 Score 0 0  PHQ- 9 Score 1    HPI  Mild episode of recurrent major depressive disorder (HCC) Wellbutrin by Tamela Oddi with [place] [facility]. 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 Worse in last 32month, occurs daily, each episode last only a few seconds. trigger-hyperextension on neck or supine position or rapid head movement. denies any nausea or tinnitus or paresthesia, or hearing loss or syncope No coffee, drinks tea-2cups ALCOHOL: 2drinks weekly-wine or mixed drink. H2O: 60-80oz daily.  Provided home vertigo exercise. Consider ENT and/or vestibular rehab referral if no  improvement.   Past Medical History:  Diagnosis Date   Acute appendicitis 09/14/2018   Allergy    Anemia    Anxiety    Asthma    BRCA negative 12/2014   Depression    Endometriosis    Esophagitis    Fibroid    GERD (gastroesophageal reflux disease)    Hiatal hernia    History of diverticulitis    IC (interstitial cystitis)    Osteoporosis 01/2019   2018 T score -2.6, 2020 T score -2.0 on Actonel   Pain in the coccyx 12/04/2018   Schatzki's ring    Past Surgical History:  Procedure Laterality Date   CHOLECYSTECTOMY     HYSTEROSCOPY  12/2003   RESECTON OF SUBMUCOUS  MYOMA/DX SCOPE   LAPAROSCOPIC APPENDECTOMY N/A 09/14/2018   Procedure: APPENDECTOMY LAPAROSCOPIC;  Surgeon: Andria Meuse, MD;  Location: [facility] OR;  Service: General;  Laterality: N/A;   LAPAROSCOPIC BILATERAL SALPINGO OOPHERECTOMY Bilateral 07/02/2014   Procedure: LAPAROSCOPIC BILATERAL SALPINGO OOPHORECTOMY;  Surgeon: Dara Lords, MD;  Location: [facility];  Service: Gynecology;  Laterality: Bilateral;   PELVIC LAPAROSCOPY  12/2003   DX SCOPE/RESECTION OF SUBMUCOUS MYOMA   TMJ ARTHROSCOPY  11-26-2001   ULNAR TUNNEL RELEASE Right 06/01/2022   Procedure: CUBITAL TUNNEL RELEASE WITH POSSIBLE ANTERIOR TRANSPOSITION;  Surgeon: Marlyne Beards, MD;  Location: [facility];  Service: Orthopedics;  Laterality: Right;  62   VAGINAL HYSTERECTOMY  12/05   Menorrhagia/dysmenorrhea. Pathology showed leiomyoma/adenomyosis   Social History   Socioeconomic History   Marital status: Married    Spouse name: Not on file   Number of children: 2   Years of education: Not on file   Highest education level: Not on file  Occupational History   Not on file  Tobacco Use   Smoking status: Never   Smokeless tobacco: Never  Vaping Use   Vaping status: Never Used  Substance and Sexual Activity   Alcohol use: Yes    Alcohol/week: 2.0 standard drinks of alcohol    Types: 2 Glasses of wine per week     Comment: occ   Drug use: No   Sexual activity: Yes    Birth control/protection: Surgical    Comment: HYST-1st intercourse 62 yo-Fewer than 5 partners  Other Topics Concern   Not on file  Social History Narrative   Not on file   Social Determinants of Health   Financial Resource Strain: Not on file  Food Insecurity: Not on file  Transportation Needs: Not on file  Physical Activity: Not on file  Stress: Not on file  Social Connections: Not on file  Intimate Partner Violence: Not on file   Family Status  Relation Name Status   Mother  Alive   Father  Deceased   Sister Billie half-sister Alive   Sister Darel Hong half-sister Alive   Sister Danella Sensing half-sister Alive   Brother paternal half Alive   Brother maternal half Alive   Brother Dan Alive   MGM  Deceased   MGF  Deceased   PGM  Deceased   PGF  Deceased   Daughter  Alive   Daughter  Alive   Other  Alive   Other mat niece Alive   Neg Hx  (Not Specified)  No partnership data on file   Family History  Problem Relation Age of Onset   Depression Mother    Hyperlipidemia Mother    Hypertension Mother    Mental illness Mother    Miscarriages / [place] Mother    Pulmonary fibrosis Mother        possible textile mil exposure   Emphysema Father    Early death Father    Breast cancer Sister        paternal half sister dx in her early 53s   Breast cancer Sister        paternal half sister dx in her late 56s   Breast cancer Sister    Diabetes Brother 55       HALF BROTHER/HALF BROTHER    GI problems Brother    Uterine cancer Maternal Grandmother        dx in her 27s   Cystic fibrosis Other        brother's granddauther   Cystic fibrosis Other        brothers granddaughters   Colon cancer Neg Hx    Esophageal cancer Neg Hx    Rectal cancer Neg Hx    Stomach cancer Neg Hx    Allergic rhinitis Neg Hx    Asthma Neg Hx    Eczema Neg Hx    Urticaria Neg Hx    Colon polyps Neg Hx    Allergies  Allergen Reactions    Honey Bee Treatment [Bee Venom] Anaphylaxis   Egg-Derived Products     Stomach ache   Methocarbamol    Peanut-Containing Drug Products Other (See Comments)  Allergy testing   Sesame Seed (Diagnostic) Other (See Comments)    Allergy test   Soy Allergy Other (See Comments)    Allergy test   Wheat     Intolerance     Patient Care Team: Asley Baskerville, Bonna Gains, NP as PCP - General (Internal Medicine) Romualdo Bolk, MD (Inactive) as Consulting Physician (Obstetrics and Gynecology)   Medications: Outpatient Medications Prior to Visit  Medication Sig   albuterol (VENTOLIN HFA) 108 (90 Base) MCG/ACT inhaler Inhale 2 puffs into the lungs every 6 (six) hours as needed for wheezing or shortness of breath.   buPROPion (WELLBUTRIN XL) 150 MG 24 hr tablet Take 150 mg by mouth 2 (two) times daily.   Calcium Carbonate (CALCIUM 600 PO) Take 1 tablet by mouth daily.   EPINEPHrine 0.3 mg/0.3 mL IJ SOAJ injection Inject 0.3 mg into the muscle as needed for anaphylaxis.   Estradiol (VAGIFEM) 10 MCG TABS vaginal tablet INSERT 1 TABLET VAGINALLY EVERY NIGHT AT BEDTIME FOR 1 WEEK, THEN TWICE WEEKLY AT BEDTIME   Glucosamine-Chondroitin-MSM 1500-1200-500 MG PACK Take 1 tablet by mouth daily.   LORazepam (ATIVAN) 0.5 MG tablet Take 0.5 mg by mouth daily as needed for sleep.   MAGNESIUM GLYCINATE PLUS PO Take 2 capsules by mouth at bedtime.   mometasone (NASONEX) 50 MCG/ACT nasal spray Place 2 sprays into the nose in the morning and at bedtime.   montelukast (SINGULAIR) 10 MG tablet Take 1 tablet (10 mg total) by mouth at bedtime.   Multiple Vitamin (MULTIVITAMIN) tablet Take 1 tablet by mouth daily.   olopatadine (PATANOL) 0.1 % ophthalmic solution Place 1 drop into both eyes 2 (two) times daily.   Omega-3 Fatty Acids (OMEGAPURE 780 EC PO) Take 1 tablet by mouth daily.   No facility-administered medications prior to visit.    Review of Systems  Constitutional:  Negative for activity change, appetite  change and unexpected weight change.  Respiratory: Negative.    Cardiovascular: Negative.   Gastrointestinal: Negative.   Endocrine: Negative for cold intolerance and heat intolerance.  Genitourinary: Negative.   Musculoskeletal: Negative.   Skin: Negative.   Neurological:  Positive for light-headedness.  Hematological: Negative.   Psychiatric/Behavioral:  Negative for behavioral problems, decreased concentration, dysphoric mood, hallucinations, self-injury, sleep disturbance and suicidal ideas. The patient is not nervous/anxious.    Last CBC Lab Results  Component Value Date   WBC 7.4 06/16/2021   HGB 12.5 06/16/2021   HCT 38.3 06/16/2021   MCV 85.3 06/16/2021   MCH 28.2 09/12/2019   RDW 13.7 06/16/2021   PLT 236.0 06/16/2021   Last metabolic panel Lab Results  Component Value Date   GLUCOSE 79 06/26/2023   NA 141 06/26/2023   K 4.4 06/26/2023   CL 102 06/26/2023   CO2 31 06/26/2023   BUN 14 06/26/2023   CREATININE 0.75 06/26/2023   GFR 86.74 06/26/2023   CALCIUM 9.6 06/26/2023   PROT 7.2 06/26/2023   ALBUMIN 4.4 06/26/2023   LABGLOB 2.6 09/12/2019   AGRATIO 1.7 09/12/2019   BILITOT 0.4 06/26/2023   ALKPHOS 106 06/26/2023   AST 18 06/26/2023   ALT 17 06/26/2023   Last lipids Lab Results  Component Value Date   CHOL 143 06/26/2023   HDL 62.60 06/26/2023   LDLCALC 69 06/26/2023   TRIG 55.0 06/26/2023   CHOLHDL 2 06/26/2023   Last hemoglobin A1c Lab Results  Component Value Date   HGBA1C 5.1 09/12/2019   Last thyroid functions Lab Results  Component Value  Date   TSH 1.74 06/16/2021   Last vitamin D Lab Results  Component Value Date   VD25OH 60.2 09/12/2019      Objective:  BP 118/74 (BP Location: Left Arm, Patient Position: Sitting, Cuff Size: Normal)   Pulse 70   Temp 98.1 F (36.7 C) (Temporal)   Resp 18   Ht 5\' 1"  (1.549 m)   Wt 138 lb 3.2 oz (62.7 kg)   SpO2 100%   BMI 26.11 kg/m     Physical Exam Vitals and nursing note reviewed.   Constitutional:      General: She is not in acute distress. HENT:     Right Ear: Tympanic membrane, ear canal and external ear normal.     Left Ear: Tympanic membrane, ear canal and external ear normal.     Nose: Nose normal.  Eyes:     Extraocular Movements: Extraocular movements intact.     Conjunctiva/sclera: Conjunctivae normal.     Pupils: Pupils are equal, round, and reactive to light.  Neck:     Thyroid: No thyroid mass, thyromegaly or thyroid tenderness.  Cardiovascular:     Rate and Rhythm: Normal rate and regular rhythm.     Pulses: Normal pulses.     Heart sounds: Normal heart sounds.  Pulmonary:     Effort: Pulmonary effort is normal.     Breath sounds: Normal breath sounds.  Abdominal:     General: Bowel sounds are normal.     Palpations: Abdomen is soft.  Musculoskeletal:        General: Normal range of motion.     Cervical back: Normal range of motion and neck supple.     Right lower leg: No edema.     Left lower leg: No edema.  Lymphadenopathy:     Cervical: No cervical adenopathy.  Skin:    General: Skin is warm and dry.  Neurological:     Mental Status: She is alert and oriented to person, place, and time.     Cranial Nerves: No cranial nerve deficit.  Psychiatric:        Mood and Affect: Mood normal.        Behavior: Behavior normal.        Thought Content: Thought content normal.     Results for orders placed or performed in visit on 06/26/23  Comprehensive metabolic panel  Result Value Ref Range   Sodium 141 135 - 145 mEq/L   Potassium 4.4 3.5 - 5.1 mEq/L   Chloride 102 96 - 112 mEq/L   CO2 31 19 - 32 mEq/L   Glucose, Bld 79 70 - 99 mg/dL   BUN 14 6 - 23 mg/dL   Creatinine, Ser 4.27 0.40 - 1.20 mg/dL   Total Bilirubin 0.4 0.2 - 1.2 mg/dL   Alkaline Phosphatase 106 39 - 117 U/L   AST 18 0 - 37 U/L   ALT 17 0 - 35 U/L   Total Protein 7.2 6.0 - 8.3 g/dL   Albumin 4.4 3.5 - 5.2 g/dL   GFR 06.23 >76.28 mL/min   Calcium 9.6 8.4 - 10.5 mg/dL   Lipid panel  Result Value Ref Range   Cholesterol 143 0 - 200 mg/dL   Triglycerides 31.5 0.0 - 149.0 mg/dL   HDL 17.61 >60.73 mg/dL   VLDL 71.0 0.0 - 62.6 mg/dL   LDL Cholesterol 69 0 - 99 mg/dL   Total CHOL/HDL Ratio 2    NonHDL 79.94       Assessment &  Plan:    Routine Health Maintenance and Physical Exam  Immunization History  Administered Date(s) Administered   Influenza,inj,Quad PF,6+ Mos 07/11/2012, 08/16/2013, 06/11/2014, 07/22/2015, 07/25/2016, 06/28/2019, 06/15/2020, 06/16/2021, 06/21/2022   PFIZER(Purple Top)SARS-COV-2 Vaccination 11/28/2019, 12/23/2019, 07/06/2020   Tdap 03/20/2018   Zoster Recombinant(Shingrix) 06/16/2021, 09/16/2021   Health Maintenance  Topic Date Due   Cervical Cancer Screening (HPV/Pap Cotest)  08/17/2020   COVID-19 Vaccine (4 - 2023-24 season) 11/14/2023 (Originally 04/23/2023)   INFLUENZA VACCINE  11/20/2023 (Originally 03/23/2023)   MAMMOGRAM  01/22/2025   DTaP/Tdap/Td (2 - Td or Tdap) 03/20/2028   Colonoscopy  06/20/2033   Hepatitis C Screening  Completed   HIV Screening  Completed   Zoster Vaccines- Shingrix  Completed   HPV VACCINES  Aged Out   Discussed health benefits of physical activity, and encouraged her to engage in regular exercise appropriate for her age and condition.  Problem List Items Addressed This Visit     Benign paroxysmal positional vertigo due to bilateral vestibular disorder    Chronic, onset 2010 after head injury without skull fracture. Worse in last 42month, occurs daily, each episode last only a few seconds. trigger-hyperextension on neck or supine position or rapid head movement. denies any nausea or tinnitus or paresthesia, or hearing loss or syncope No coffee, drinks tea-2cups ALCOHOL: 2drinks weekly-wine or mixed drink. H2O: 60-80oz daily.  Provided home vertigo exercise. Consider ENT and/or vestibular rehab referral if no improvement.      Mild episode of recurrent major depressive disorder (HCC)     Wellbutrin by Tamela Oddi with [facility]. No CBT needed      Other Visit Diagnoses     Encounter for preventative adult health care exam with abnormal findings    -  Primary   Relevant Orders   Comprehensive metabolic panel (Completed)   Lipid panel (Completed)   Encounter for lipid screening for cardiovascular disease       Relevant Orders   Lipid panel (Completed)      Return in about 1 year (around 06/25/2024) for CPE (fasting).     Alysia Penna, NP

## 2023-06-26 NOTE — Assessment & Plan Note (Signed)
Wellbutrin by Tamela Oddi with [facility]. No CBT needed

## 2023-06-26 NOTE — Assessment & Plan Note (Signed)
Last dexa scan 2024: multiple sites Current use of calcium and Vit. D

## 2023-06-26 NOTE — Patient Instructions (Signed)
Go to lab Continue Heart healthy diet and daily exercise. Let me know if vertigo does not improve in 2weeks Schedule appointment with GYN  Preventive Care 87-60 Years Old, Female Preventive care refers to lifestyle choices and visits with your health care provider that can promote health and wellness. Preventive care visits are also called wellness exams. What can I expect for my preventive care visit? Counseling Your health care provider may ask you questions about your: Medical history, including: Past medical problems. Family medical history. Pregnancy history. Current health, including: Menstrual cycle. Method of birth control. Emotional well-being. Home life and relationship well-being. Sexual activity and sexual health. Lifestyle, including: Alcohol, nicotine or tobacco, and drug use. Access to firearms. Diet, exercise, and sleep habits. Work and work Astronomer. Sunscreen use. Safety issues such as seatbelt and bike helmet use. Physical exam Your health care provider will check your: Height and weight. These may be used to calculate your BMI (body mass index). BMI is a measurement that tells if you are at a healthy weight. Waist circumference. This measures the distance around your waistline. This measurement also tells if you are at a healthy weight and may help predict your risk of certain diseases, such as type 2 diabetes and high blood pressure. Heart rate and blood pressure. Body temperature. Skin for abnormal spots. What immunizations do I need?  Vaccines are usually given at various ages, according to a schedule. Your health care provider will recommend vaccines for you based on your age, medical history, and lifestyle or other factors, such as travel or where you work. What tests do I need? Screening Your health care provider may recommend screening tests for certain conditions. This may include: Lipid and cholesterol levels. Diabetes screening. This is done  by checking your blood sugar (glucose) after you have not eaten for a while (fasting). Pelvic exam and Pap test. Hepatitis B test. Hepatitis C test. HIV (human immunodeficiency virus) test. STI (sexually transmitted infection) testing, if you are at risk. Lung cancer screening. Colorectal cancer screening. Mammogram. Talk with your health care provider about when you should start having regular mammograms. This may depend on whether you have a family history of breast cancer. BRCA-related cancer screening. This may be done if you have a family history of breast, ovarian, tubal, or peritoneal cancers. Bone density scan. This is done to screen for osteoporosis. Talk with your health care provider about your test results, treatment options, and if necessary, the need for more tests. Follow these instructions at home: Eating and drinking  Eat a diet that includes fresh fruits and vegetables, whole grains, lean protein, and low-fat dairy products. Take vitamin and mineral supplements as recommended by your health care provider. Do not drink alcohol if: Your health care provider tells you not to drink. You are pregnant, may be pregnant, or are planning to become pregnant. If you drink alcohol: Limit how much you have to 0-1 drink a day. Know how much alcohol is in your drink. In the [place] one drink equals one 12 oz bottle of beer (355 mL), one 5 oz glass of wine (148 mL), or one 1 oz glass of hard liquor (44 mL). Lifestyle Brush your teeth every morning and night with fluoride toothpaste. Floss one time each day. Exercise for at least 30 minutes 5 or more days each week. Do not use any products that contain nicotine or tobacco. These products include cigarettes, chewing tobacco, and vaping devices, such as e-cigarettes. If you need help quitting, ask  your health care provider. Do not use drugs. If you are sexually active, practice safe sex. Use a condom or other form of protection to prevent  STIs. If you do not wish to become pregnant, use a form of birth control. If you plan to become pregnant, see your health care provider for a prepregnancy visit. Take aspirin only as told by your health care provider. Make sure that you understand how much to take and what form to take. Work with your health care provider to find out whether it is safe and beneficial for you to take aspirin daily. Find healthy ways to manage stress, such as: Meditation, yoga, or listening to music. Journaling. Talking to a trusted person. Spending time with friends and family. Minimize exposure to UV radiation to reduce your risk of skin cancer. Safety Always wear your seat belt while driving or riding in a vehicle. Do not drive: If you have been drinking alcohol. Do not ride with someone who has been drinking. When you are tired or distracted. While texting. If you have been using any mind-altering substances or drugs. Wear a helmet and other protective equipment during sports activities. If you have firearms in your house, make sure you follow all gun safety procedures. Seek help if you have been physically or sexually abused. What's next? Visit your health care provider once a year for an annual wellness visit. Ask your health care provider how often you should have your eyes and teeth checked. Stay up to date on all vaccines. This information is not intended to replace advice given to you by your health care provider. Make sure you discuss any questions you have with your health care provider. Document Revised: 02/03/2021 Document Reviewed: 02/03/2021 Elsevier Patient Education  2024 [employer].

## 2023-07-18 ENCOUNTER — Encounter: Payer: Self-pay | Admitting: Nurse Practitioner

## 2023-07-24 ENCOUNTER — Ambulatory Visit (INDEPENDENT_AMBULATORY_CARE_PROVIDER_SITE_OTHER): Payer: No Typology Code available for payment source | Admitting: *Deleted

## 2023-07-24 DIAGNOSIS — J309 Allergic rhinitis, unspecified: Secondary | ICD-10-CM | POA: Diagnosis not present

## 2023-08-01 ENCOUNTER — Encounter: Payer: Self-pay | Admitting: Obstetrics and Gynecology

## 2023-08-03 ENCOUNTER — Ambulatory Visit
Admission: RE | Admit: 2023-08-03 | Discharge: 2023-08-03 | Disposition: A | Discharge status: Home / Self Care | Payer: No Typology Code available for payment source | Source: Ambulatory Visit | Attending: Obstetrics and Gynecology | Admitting: Obstetrics and Gynecology

## 2023-08-03 DIAGNOSIS — Z9189 Other specified personal risk factors, not elsewhere classified: Secondary | ICD-10-CM

## 2023-08-03 DIAGNOSIS — Z803 Family history of malignant neoplasm of breast: Secondary | ICD-10-CM

## 2023-08-03 MED ORDER — GADOPICLENOL 0.5 MMOL/ML IV SOLN
6.0000 mL | Freq: Once | INTRAVENOUS | Status: AC | PRN
  Administered 2023-08-03: 6 mL via INTRAVENOUS

## 2023-08-03 MED ORDER — GADOPICLENOL 0.5 MMOL/ML IV SOLN: 6.0000 mL | Freq: Once | INTRAVENOUS | Status: DC | PRN

## 2023-08-21 ENCOUNTER — Ambulatory Visit (INDEPENDENT_AMBULATORY_CARE_PROVIDER_SITE_OTHER): Payer: Self-pay | Admitting: *Deleted

## 2023-08-21 DIAGNOSIS — J309 Allergic rhinitis, unspecified: Secondary | ICD-10-CM | POA: Diagnosis not present

## 2023-08-27 ENCOUNTER — Other Ambulatory Visit: Payer: Self-pay | Admitting: Allergy and Immunology

## 2023-09-18 ENCOUNTER — Ambulatory Visit (INDEPENDENT_AMBULATORY_CARE_PROVIDER_SITE_OTHER): Payer: Self-pay

## 2023-09-18 DIAGNOSIS — J309 Allergic rhinitis, unspecified: Secondary | ICD-10-CM

## 2023-10-16 ENCOUNTER — Ambulatory Visit (INDEPENDENT_AMBULATORY_CARE_PROVIDER_SITE_OTHER): Payer: Self-pay

## 2023-10-16 DIAGNOSIS — J309 Allergic rhinitis, unspecified: Secondary | ICD-10-CM

## 2023-11-13 ENCOUNTER — Ambulatory Visit (INDEPENDENT_AMBULATORY_CARE_PROVIDER_SITE_OTHER): Payer: Self-pay

## 2023-11-13 DIAGNOSIS — J309 Allergic rhinitis, unspecified: Secondary | ICD-10-CM | POA: Diagnosis not present

## 2023-12-11 ENCOUNTER — Ambulatory Visit (INDEPENDENT_AMBULATORY_CARE_PROVIDER_SITE_OTHER): Payer: Self-pay

## 2023-12-11 DIAGNOSIS — J309 Allergic rhinitis, unspecified: Secondary | ICD-10-CM

## 2023-12-15 ENCOUNTER — Ambulatory Visit (INDEPENDENT_AMBULATORY_CARE_PROVIDER_SITE_OTHER): Admitting: Nurse Practitioner

## 2023-12-15 ENCOUNTER — Encounter: Payer: Self-pay | Admitting: Nurse Practitioner

## 2023-12-15 VITALS — BP 122/78 | HR 84 | Temp 98.7°F | Ht 61.0 in | Wt 137.0 lb

## 2023-12-15 DIAGNOSIS — J209 Acute bronchitis, unspecified: Secondary | ICD-10-CM | POA: Diagnosis not present

## 2023-12-15 MED ORDER — METHYLPREDNISOLONE 4 MG PO TBPK
ORAL_TABLET | ORAL | 0 refills | Status: DC
Start: 2023-12-15 — End: 2024-01-08

## 2023-12-15 MED ORDER — HYDROCOD POLI-CHLORPHE POLI ER 10-8 MG/5ML PO SUER
5.0000 mL | Freq: Two times a day (BID) | ORAL | 0 refills | Status: DC | PRN
Start: 2023-12-15 — End: 2024-01-08

## 2023-12-15 MED ORDER — AZITHROMYCIN 250 MG PO TABS
250.0000 mg | ORAL_TABLET | Freq: Every day | ORAL | 0 refills | Status: DC
Start: 2023-12-15 — End: 2024-01-08

## 2023-12-15 NOTE — Progress Notes (Signed)
 Established Patient Visit  Patient: Yolanda [name]   DOB: 06-24-1961   63 y.o. Female  MRN: [id number] Visit Date: 12/15/2023  Subjective:    Chief Complaint  Patient presents with   Cough    Started as a tickle in throat with cough 6 days ago and Sx progressively worsen    Cough This is a new problem. The current episode started in the past 7 days. The problem has been unchanged. The problem occurs constantly. The cough is Non-productive. Associated symptoms include chest pain, chills, a fever, headaches, myalgias, nasal congestion, postnasal drip, rhinorrhea and a sore throat. Pertinent negatives include no shortness of breath, sweats, weight loss or wheezing. The symptoms are aggravated by lying down and pollens. She has tried a beta-agonist inhaler and OTC cough suppressant for the symptoms. The treatment provided mild relief. Her past medical history is significant for bronchitis and environmental allergies.  Sick contact with daughter and grandson. Reports negative home COVID test Declined flu test.  No problem-specific Assessment & Plan notes found for this encounter.  Reviewed medical, surgical, and social history today  Medications: Outpatient Medications Prior to Visit  Medication Sig   albuterol  (VENTOLIN  HFA) 108 (90 Base) MCG/ACT inhaler Inhale 2 puffs into the lungs every 6 (six) hours as needed for wheezing or shortness of breath.   buPROPion  (WELLBUTRIN  XL) 150 MG 24 hr tablet Take 150 mg by mouth 2 (two) times daily.   Calcium Carbonate (CALCIUM 600 PO) Take 1 tablet by mouth daily.   EPINEPHrine  0.3 mg/0.3 mL IJ SOAJ injection Inject 0.3 mg into the muscle as needed for anaphylaxis.   Estradiol  (VAGIFEM ) 10 MCG TABS vaginal tablet INSERT 1 TABLET VAGINALLY EVERY NIGHT AT BEDTIME FOR 1 WEEK, THEN TWICE WEEKLY AT BEDTIME   Glucosamine-Chondroitin-MSM 1500-1200-500 MG PACK Take 1 tablet by mouth daily.   LORazepam (ATIVAN) 0.5 MG tablet Take 0.5 mg by mouth  daily as needed for sleep.   MAGNESIUM  GLYCINATE PLUS PO Take 2 capsules by mouth at bedtime.   mometasone  (NASONEX ) 50 MCG/ACT nasal spray Place 2 sprays into the nose in the morning and at bedtime.   montelukast  (SINGULAIR ) 10 MG tablet TAKE 1 TABLET(10 MG) BY MOUTH AT BEDTIME   Multiple Vitamin (MULTIVITAMIN) tablet Take 1 tablet by mouth daily.   olopatadine  (PATANOL) 0.1 % ophthalmic solution Place 1 drop into both eyes 2 (two) times daily.   Omega-3 Fatty Acids (OMEGAPURE 780 EC PO) Take 1 tablet by mouth daily.   No facility-administered medications prior to visit.   Reviewed past medical and social history.   ROS per HPI above      Objective:  BP 122/78 (BP Location: Left Arm, Patient Position: Sitting)   Pulse 84   Temp 98.7 F (37.1 C) (Temporal)   Ht 5\' 1"  (1.549 m)   Wt 137 lb (62.1 kg)   SpO2 97%   BMI 25.89 kg/m      Physical Exam Vitals and nursing note reviewed.  Constitutional:      General: She is not in acute distress. HENT:     Right Ear: Tympanic membrane, ear canal and external ear normal.     Left Ear: Tympanic membrane, ear canal and external ear normal.     Nose: Congestion and rhinorrhea present. No nasal tenderness or mucosal edema.     Right Nostril: No occlusion.     Left Nostril: No occlusion.  Right Turbinates: Not enlarged, swollen or pale.     Left Turbinates: Not enlarged, swollen or pale.     Right Sinus: No maxillary sinus tenderness or frontal sinus tenderness.     Left Sinus: No maxillary sinus tenderness or frontal sinus tenderness.     Mouth/Throat:     Pharynx: Oropharynx is clear. Uvula midline. Posterior oropharyngeal erythema and postnasal drip present. No oropharyngeal exudate.     Tonsils: No tonsillar exudate or tonsillar abscesses.  Eyes:     Extraocular Movements: Extraocular movements intact.     Conjunctiva/sclera: Conjunctivae normal.  Cardiovascular:     Rate and Rhythm: Normal rate and regular rhythm.      Pulses: Normal pulses.     Heart sounds: Normal heart sounds.  Pulmonary:     Effort: Pulmonary effort is normal.     Breath sounds: Normal breath sounds.  Musculoskeletal:     Cervical back: Normal range of motion and neck supple.  Lymphadenopathy:     Cervical: No cervical adenopathy.  Neurological:     Mental Status: She is alert and oriented to person, place, and time.     No results found for any visits on 12/15/23.    Assessment & Plan:    Problem List Items Addressed This Visit   None Visit Diagnoses       Acute bronchitis, unspecified organism    -  Primary   Relevant Medications   methylPREDNISolone (MEDROL DOSEPAK) 4 MG TBPK tablet   chlorpheniramine-HYDROcodone  (TUSSIONEX) 10-8 MG/5ML   azithromycin  (ZITHROMAX  Z-PAK) 250 MG tablet     maintain adequate oral hydration. Stop oral decongestant Ok to continue mucinex and albuterol  prn You can use plain "Tylenol " or "Advil " for fever, chills and achyness. Use cool mist humidifier at bedtime to help with nasal congestion and cough. Start azithromycin  if no improvement in 3days with medrol dose pack  Return if symptoms worsen or fail to improve.     Kathrene Parents, NP

## 2023-12-15 NOTE — Patient Instructions (Signed)
 URI Instructions: Encourage adequate oral hydration. Stop oral decongestant Ok to continue mucinex and albuterol  prn You can use plain "Tylenol " or "Advil " for fever, chills and achyness. Use cool mist humidifier at bedtime to help with nasal congestion and cough.  Start azithromycin  if no improvement in 3days with medrol dose pack

## 2024-01-04 NOTE — Progress Notes (Signed)
 63 y.o. G26P2002 Married Caucasian female here for annual exam.    Would like to sleep better and loose weight.  Takes Ativan prescription for sleep as needed.  3 recent deaths.  Will do counseling.   Married.  2 daughters and 2 grandchildren.  PCP: Kandace Organ, NP  Emory Harps, PA - [facility] - provides Rx for Ativan, Wellbutrin .    No LMP recorded. Patient has had a hysterectomy.           Sexually active: Yes.    The current method of family planning is status post hysterectomy.    Menopausal hormone therapy:  Estradiol   Exercising: Yes.    Walking, tennis, golf, and personal trainer 1x a week Smoker:  no  OB History  Gravida Para Term Preterm AB Living  2 2 2   2   SAB IAB Ectopic Multiple Live Births      2    # Outcome Date GA Lbr Len/2nd Weight Sex Type Anes PTL Lv  2 Term     F Vag-Spont  N LIV  1 Term     F Vag-Spont  N LIV     HEALTH MAINTENANCE: Last 2 paps:  08/17/17 neg, 06/24/14 neg History of abnormal Pap or positive HPV:  no Mammogram:   01/23/23 Breast Density Cat C, BIRADS Cat 1 neg  Colonoscopy:  06/21/23 - due in 10 years Bone Density:  03/06/23  Result  osteopenia of hips:  FRAX 16%/2%.  Status post Actonel  for 5 years.    Immunization History  Administered Date(s) Administered   Influenza, Mdck, Trivalent,PF 6+ MOS(egg free) 08/11/2023   Influenza,inj,Quad PF,6+ Mos 07/11/2012, 08/16/2013, 06/11/2014, 07/22/2015, 07/25/2016, 06/28/2019, 06/15/2020, 06/16/2021, 06/21/2022   Influenza-Unspecified 06/15/2020, 06/16/2021   Moderna Covid-19 Fall Seasonal Vaccine 82yrs & older 07/27/2022, 10/13/2023   PFIZER(Purple Top)SARS-COV-2 Vaccination 11/28/2019, 12/23/2019, 06/26/2020, 07/06/2020   Pfizer Covid-19 Vaccine Bivalent Booster 23yrs & up 08/05/2021   Tdap 03/20/2018   Zoster Recombinant(Shingrix) 06/16/2021, 09/16/2021      reports that she has never smoked. She has never used smokeless tobacco. She reports current  alcohol use of about 2.0 standard drinks of alcohol per week. She reports that she does not use drugs.  Past Medical History:  Diagnosis Date   Acute appendicitis 09/14/2018   Allergy    Anemia    Anxiety    Asthma    BRCA negative 12/2014   Depression    Endometriosis    Esophagitis    Fibroid    GERD (gastroesophageal reflux disease)    Hiatal hernia    History of diverticulitis    IC (interstitial cystitis)    Medial epicondylitis of elbow, left 06/15/2020   Osteoporosis 01/2019   2018 T score -2.6, 2020 T score -2.0 on Actonel    Pain in the coccyx 12/04/2018   Schatzki's ring     Past Surgical History:  Procedure Laterality Date   CHOLECYSTECTOMY     HYSTEROSCOPY  12/2003   RESECTON OF SUBMUCOUS  MYOMA/DX SCOPE   LAPAROSCOPIC APPENDECTOMY N/A 09/14/2018   Procedure: APPENDECTOMY LAPAROSCOPIC;  Surgeon: Melvenia Stabs, MD;  Location: [facility];  Service: General;  Laterality: N/A;   LAPAROSCOPIC BILATERAL SALPINGO OOPHERECTOMY Bilateral 07/02/2014   Procedure: LAPAROSCOPIC BILATERAL SALPINGO OOPHORECTOMY;  Surgeon: Lacretia Piccolo, MD;  Location: [facility];  Service: Gynecology;  Laterality: Bilateral;   PELVIC LAPAROSCOPY  12/2003   DX SCOPE/RESECTION OF SUBMUCOUS MYOMA   TMJ ARTHROSCOPY  11-26-2001  ULNAR TUNNEL RELEASE Right 06/01/2022   Procedure: CUBITAL TUNNEL RELEASE WITH POSSIBLE ANTERIOR TRANSPOSITION;  Surgeon: Marilyn Shropshire, MD;  Location: [facility];  Service: Orthopedics;  Laterality: Right;  62   VAGINAL HYSTERECTOMY  12/05   Menorrhagia/dysmenorrhea. Pathology showed leiomyoma/adenomyosis    Current Outpatient Medications  Medication Sig Dispense Refill   buPROPion  (WELLBUTRIN  XL) 150 MG 24 hr tablet Take 150 mg by mouth 2 (two) times daily.     Calcium Carbonate (CALCIUM 600 PO) Take 1 tablet by mouth daily.     Estradiol  (VAGIFEM ) 10 MCG TABS vaginal tablet INSERT 1 TABLET VAGINALLY EVERY NIGHT AT BEDTIME FOR 1 WEEK, THEN TWICE  WEEKLY AT BEDTIME 24 tablet 3   Glucosamine-Chondroitin-MSM 1500-1200-500 MG PACK Take 1 tablet by mouth daily.     LORazepam (ATIVAN) 0.5 MG tablet Take 0.5 mg by mouth daily as needed for sleep.     MAGNESIUM  GLYCINATE PLUS PO Take 2 capsules by mouth at bedtime.     montelukast  (SINGULAIR ) 10 MG tablet TAKE 1 TABLET(10 MG) BY MOUTH AT BEDTIME 90 tablet 2   Multiple Vitamin (MULTIVITAMIN) tablet Take 1 tablet by mouth daily.     Omega-3 Fatty Acids (OMEGAPURE 780 EC PO) Take 1 tablet by mouth daily.     albuterol  (VENTOLIN  HFA) 108 (90 Base) MCG/ACT inhaler Inhale 2 puffs into the lungs every 6 (six) hours as needed for wheezing or shortness of breath. (Patient not taking: Reported on 01/08/2024) 18 g 1   EPINEPHrine  0.3 mg/0.3 mL IJ SOAJ injection Inject 0.3 mg into the muscle as needed for anaphylaxis. (Patient not taking: Reported on 01/08/2024) 2 each 2   mometasone  (NASONEX ) 50 MCG/ACT nasal spray Place 2 sprays into the nose in the morning and at bedtime. (Patient not taking: Reported on 01/08/2024) 51 g 3   olopatadine  (PATANOL) 0.1 % ophthalmic solution Place 1 drop into both eyes 2 (two) times daily. (Patient not taking: Reported on 01/08/2024) 15 mL 3   No current facility-administered medications for this visit.    ALLERGIES: Honey bee treatment [bee venom], Egg-derived products, Methocarbamol , Peanut-containing drug products, Sesame seed (diagnostic), Soy allergy (obsolete), and Wheat  Family History  Problem Relation Age of Onset   Depression Mother    Hyperlipidemia Mother    Hypertension Mother    Mental illness Mother    Miscarriages / [place] Mother    Pulmonary fibrosis Mother        possible textile mil exposure   Emphysema Father    Early death Father    Breast cancer Sister        paternal half sister dx in her early 70s   Breast cancer Sister        paternal half sister dx in her late 16s   Breast cancer Sister    Diabetes Brother 55       HALF BROTHER/HALF  BROTHER    GI problems Brother    Uterine cancer Maternal Grandmother        dx in her 36s   Cystic fibrosis Other        brother's granddauther   Cystic fibrosis Other        brothers granddaughters   Colon cancer Neg Hx    Esophageal cancer Neg Hx    Rectal cancer Neg Hx    Stomach cancer Neg Hx    Allergic rhinitis Neg Hx    Asthma Neg Hx    Eczema Neg Hx    Urticaria Neg  Hx    Colon polyps Neg Hx     Review of Systems  All other systems reviewed and are negative.   PHYSICAL EXAM:  BP 114/76 (BP Location: Left Arm)   Pulse 89   Ht 5' 1.5" (1.562 m)   Wt 139 lb (63 kg)   SpO2 90%   BMI 25.84 kg/m     General appearance: alert, cooperative and appears stated age Head: normocephalic, without obvious abnormality, atraumatic Neck: no adenopathy, supple, symmetrical, trachea midline and thyroid  normal to inspection and palpation Lungs: clear to auscultation bilaterally Breasts: normal appearance, no masses or tenderness, No nipple retraction or dimpling, No nipple discharge or bleeding, No axillary adenopathy Heart: regular rate and rhythm Abdomen: soft, non-tender; no masses, no organomegaly Extremities: extremities normal, atraumatic, no cyanosis or edema Skin: skin color, texture, turgor normal. No rashes or lesions Lymph nodes: cervical, supraclavicular, and axillary nodes normal. Neurologic: grossly normal  Pelvic: External genitalia:  no lesions              No abnormal inguinal nodes palpated.              Urethra:  normal appearing urethra with no masses, tenderness or lesions              Bartholins and Skenes: normal                 Vagina: normal appearing vagina with normal color and discharge, no lesions              Cervix: absent              Pap taken: no Bimanual Exam:  Uterus:  absent              Adnexa: no mass, fullness, tenderness              Rectal exam: yes.  Confirms.              Anus:  normal sphincter tone, no lesions  Chaperone was  present for exam:  Cottie Diss, CMA  ASSESSMENT: Well woman visit with gynecologic exam. Status post TVH.  Status post laparoscopic BSO.  Osteopenia.  On drug holiday from Actonel .  Atrophy.  Using Vagifem .  PHQ-9: 0  PLAN: Mammogram screening discussed. Self breast awareness reviewed. Pap and HRV collected:  no Guidelines for Calcium, Vitamin D , regular exercise program including cardiovascular and weight bearing exercise. Medication refills:  Vagifem  refill.  I discussed potential effect on breast cancer.  BMD in 2026.  Labs with PCP.  Follow up:  yearly and prn.

## 2024-01-08 ENCOUNTER — Encounter: Payer: Self-pay | Admitting: Obstetrics and Gynecology

## 2024-01-08 ENCOUNTER — Ambulatory Visit (INDEPENDENT_AMBULATORY_CARE_PROVIDER_SITE_OTHER): Payer: Self-pay

## 2024-01-08 ENCOUNTER — Ambulatory Visit: Payer: No Typology Code available for payment source | Admitting: Obstetrics and Gynecology

## 2024-01-08 VITALS — BP 114/76 | HR 89 | Ht 61.5 in | Wt 139.0 lb

## 2024-01-08 DIAGNOSIS — Z5181 Encounter for therapeutic drug level monitoring: Secondary | ICD-10-CM

## 2024-01-08 DIAGNOSIS — Z01419 Encounter for gynecological examination (general) (routine) without abnormal findings: Secondary | ICD-10-CM

## 2024-01-08 DIAGNOSIS — N952 Postmenopausal atrophic vaginitis: Secondary | ICD-10-CM | POA: Diagnosis not present

## 2024-01-08 DIAGNOSIS — J309 Allergic rhinitis, unspecified: Secondary | ICD-10-CM

## 2024-01-08 DIAGNOSIS — Z1331 Encounter for screening for depression: Secondary | ICD-10-CM

## 2024-01-08 MED ORDER — ESTRADIOL 10 MCG VA TABS: ORAL_TABLET | VAGINAL | 3 refills | Status: DC

## 2024-01-08 NOTE — Patient Instructions (Signed)

## 2024-01-29 LAB — HM MAMMOGRAPHY

## 2024-01-31 ENCOUNTER — Ambulatory Visit: Payer: Self-pay | Admitting: Obstetrics and Gynecology

## 2024-07-01 ENCOUNTER — Ambulatory Visit: Payer: Self-pay | Admitting: Nurse Practitioner

## 2024-07-01 ENCOUNTER — Encounter: Payer: Self-pay | Admitting: Nurse Practitioner

## 2024-07-01 ENCOUNTER — Ambulatory Visit: Payer: No Typology Code available for payment source | Admitting: Nurse Practitioner

## 2024-07-01 VITALS — BP 110/70 | HR 79 | Temp 98.3°F | Ht 61.0 in | Wt 139.6 lb

## 2024-07-01 DIAGNOSIS — Z90710 Acquired absence of both cervix and uterus: Secondary | ICD-10-CM | POA: Insufficient documentation

## 2024-07-01 DIAGNOSIS — Z136 Encounter for screening for cardiovascular disorders: Secondary | ICD-10-CM

## 2024-07-01 DIAGNOSIS — Z23 Encounter for immunization: Secondary | ICD-10-CM

## 2024-07-01 DIAGNOSIS — Z0000 Encounter for general adult medical examination without abnormal findings: Secondary | ICD-10-CM

## 2024-07-01 DIAGNOSIS — Z13220 Encounter for screening for lipoid disorders: Secondary | ICD-10-CM

## 2024-07-01 LAB — COMPREHENSIVE METABOLIC PANEL WITH GFR
ALT: 14 U/L (ref 0–35)
AST: 18 U/L (ref 0–37)
Albumin: 4.2 g/dL (ref 3.5–5.2)
Alkaline Phosphatase: 110 U/L (ref 39–117)
BUN: 14 mg/dL (ref 6–23)
CO2: 29 meq/L (ref 19–32)
Calcium: 9.2 mg/dL (ref 8.4–10.5)
Chloride: 101 meq/L (ref 96–112)
Creatinine, Ser: 0.82 mg/dL (ref 0.40–1.20)
GFR: 77.38 mL/min (ref >60.00)
Glucose, Bld: 79 mg/dL (ref 70–99)
Potassium: 4.1 meq/L (ref 3.5–5.1)
Sodium: 139 meq/L (ref 135–145)
Total Bilirubin: 0.4 mg/dL (ref 0.2–1.2)
Total Protein: 7.2 g/dL (ref 6.0–8.3)

## 2024-07-01 LAB — CBC
HCT: 37.6 % (ref 36.0–46.0)
Hemoglobin: 12.7 g/dL (ref 12.0–15.0)
MCHC: 33.6 g/dL (ref 30.0–36.0)
MCV: 83.2 fl (ref 78.0–100.0)
Platelets: 235 K/uL (ref 150.0–400.0)
RBC: 4.52 Mil/uL (ref 3.87–5.11)
RDW: 13.5 % (ref 11.5–15.5)
WBC: 6 K/uL (ref 4.0–10.5)

## 2024-07-01 LAB — TSH: TSH: 1.71 u[IU]/mL (ref 0.35–5.50)

## 2024-07-01 NOTE — Progress Notes (Signed)
 Complete physical exam  Patient: Yolanda [name]   DOB: 11-Aug-1961   63 y.o. Female  MRN: [id number] Visit Date: 07/01/2024  Subjective:    Chief Complaint  Patient presents with   Annual Exam    FASTING Due for Pap and Pneumococcal vaccine    Yolanda [name] is a 63 y.o. female who presents today for a complete physical exam. She reports consuming a general diet. Home exercise routine includes calisthenics. She generally feels well. She reports sleeping well. She does not have additional problems to discuss today.  Vision:Yes Dental:Yes STD Screen:No  BP Readings from Last 3 Encounters:  07/01/24 110/70  01/08/24 114/76  12/15/23 122/78   Wt Readings from Last 3 Encounters:  07/01/24 139 lb 9.6 oz (63.3 kg)  01/08/24 139 lb (63 kg)  12/15/23 137 lb (62.1 kg)   Most recent fall risk assessment:    07/01/2024    8:21 AM  Fall Risk   Falls in the past year? 0  Number falls in past yr: 0  Injury with Fall? 0  Risk for fall due to : No Fall Risks  Follow up Falls evaluation completed   Depression screen:Yes - No Depression Most recent depression screenings:    07/01/2024    8:21 AM 01/08/2024    1:36 PM  PHQ 2/9 Scores  PHQ - 2 Score 0 0  PHQ- 9 Score 1    HPI  No problem-specific Assessment & Plan notes found for this encounter.  Past Medical History:  Diagnosis Date   Acute appendicitis 09/14/2018   Allergy    Anemia    Anxiety    Asthma    Benign pigmented mole 02/06/2023   BRCA negative 12/2014   Closed fracture of coccyx (HCC) 12/04/2018   Depression    Endometriosis    Esophagitis    Fibroid    GERD (gastroesophageal reflux disease)    Hiatal hernia    History of diverticulitis    IC (interstitial cystitis)    Medial epicondylitis of elbow, left 06/15/2020   Numbness and tingling in right hand 04/04/2022   Osteoporosis 01/2019   2018 T score -2.6, 2020 T score -2.0 on Actonel    Pain in the coccyx 12/04/2018   Schatzki's ring    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   Past Surgical History:  Procedure Laterality Date   CHOLECYSTECTOMY     HYSTEROSCOPY  12/2003   RESECTON OF SUBMUCOUS  MYOMA/DX SCOPE   LAPAROSCOPIC APPENDECTOMY N/A 09/14/2018   Procedure: APPENDECTOMY LAPAROSCOPIC;  Surgeon: Teresa Lonni HERO, MD;  Location: [facility];  Service: General;  Laterality: N/A;   LAPAROSCOPIC BILATERAL SALPINGO OOPHERECTOMY Bilateral 07/02/2014   Procedure: LAPAROSCOPIC BILATERAL SALPINGO OOPHORECTOMY;  Surgeon: Evalene SHAUNNA Organ, MD;  Location: [facility];  Service: Gynecology;  Laterality: Bilateral;   PELVIC LAPAROSCOPY  12/2003   DX SCOPE/RESECTION OF SUBMUCOUS MYOMA   TMJ ARTHROSCOPY  11-26-2001   ULNAR TUNNEL RELEASE Right 06/01/2022   Procedure: CUBITAL TUNNEL RELEASE WITH POSSIBLE ANTERIOR TRANSPOSITION;  Surgeon: Romona Harari, MD;  Location: [facility];  Service: Orthopedics;  Laterality: Right;  62   VAGINAL HYSTERECTOMY  12/05   Menorrhagia/dysmenorrhea. Pathology showed leiomyoma/adenomyosis   Social History   Socioeconomic History   Marital status: Married    Spouse name: Not on file   Number of children: 2   Years of education: Not on file   Highest education level: Not on file  Occupational History   Not on  file  Tobacco Use   Smoking status: Never   Smokeless tobacco: Never  Vaping Use   Vaping status: Never Used  Substance and Sexual Activity   Alcohol use: Yes    Alcohol/week: 2.0 standard drinks of alcohol    Types: 2 Glasses of wine per week    Comment: occ   Drug use: No   Sexual activity: Yes    Birth control/protection: Surgical    Comment: HYST-1st intercourse 63 yo-Fewer than 5 partners  Other Topics Concern   Not on file  Social History Narrative   Not on file   Social Drivers of Health   Financial Resource Strain: Not on file  Food Insecurity: Not on file  Transportation Needs: Not on file  Physical Activity: Not on file  Stress: Not on file  Social  Connections: Not on file  Intimate Partner Violence: Not on file   Family Status  Relation Name Status   Mother  Alive   Father  Deceased   Sister Billie half-sister Alive   Sister Dagoberto half-sister Alive   Sister Alva half-sister Alive   Brother paternal half Alive   Brother maternal half Alive   Brother Dan Alive   MGM  Deceased   MGF  Deceased   PGM  Deceased   PGF  Deceased   Daughter  Alive   Daughter  Alive   Other  Alive   Other mat niece Alive   Neg Hx  (Not Specified)  No partnership data on file   Family History  Problem Relation Age of Onset   Depression Mother    Hyperlipidemia Mother    Hypertension Mother    Mental illness Mother    Miscarriages / Stillbirths Mother    Pulmonary fibrosis Mother        possible textile mil exposure   Emphysema Father    Early death Father    Breast cancer Sister        paternal half sister dx in her early 14s   Breast cancer Sister        paternal half sister dx in her late 34s   Breast cancer Sister    Diabetes Brother 77       HALF BROTHER/HALF BROTHER    GI problems Brother    Uterine cancer Maternal Grandmother        dx in her 97s   Cystic fibrosis Other        brother's granddauther   Cystic fibrosis Other        brothers granddaughters   Colon cancer Neg Hx    Esophageal cancer Neg Hx    Rectal cancer Neg Hx    Stomach cancer Neg Hx    Allergic rhinitis Neg Hx    Asthma Neg Hx    Eczema Neg Hx    Urticaria Neg Hx    Colon polyps Neg Hx    Allergies  Allergen Reactions   Honey Bee Treatment [Bee Venom] Anaphylaxis   Egg Protein-Containing Drug Products     Stomach ache   Methocarbamol     Peanut-Containing Drug Products Other (See Comments)    Allergy testing   Sesame Seed (Diagnostic) Other (See Comments)    Allergy test   Soy Allergy (Obsolete) Other (See Comments)    Allergy test   Wheat     Intolerance     Patient Care Team: Azeem Poorman, Roselie Rockford, NP as PCP - General (Internal  Medicine) Jannis Kate Norris, MD as Consulting Physician (Obstetrics  and Gynecology)   Medications: Outpatient Medications Prior to Visit  Medication Sig   albuterol  (VENTOLIN  HFA) 108 (90 Base) MCG/ACT inhaler Inhale 2 puffs into the lungs every 6 (six) hours as needed for wheezing or shortness of breath.   buPROPion  (WELLBUTRIN  XL) 150 MG 24 hr tablet Take 150 mg by mouth 2 (two) times daily.   Calcium Carbonate (CALCIUM 600 PO) Take 1 tablet by mouth daily.   EPINEPHrine  0.3 mg/0.3 mL IJ SOAJ injection Inject 0.3 mg into the muscle as needed for anaphylaxis.   Estradiol  (VAGIFEM ) 10 MCG TABS vaginal tablet PLACE IN THE VAGINA TWICE WEEKLY AT BEDTIME   Glucosamine-Chondroitin-MSM 1500-1200-500 MG PACK Take 1 tablet by mouth daily.   LORazepam (ATIVAN) 0.5 MG tablet Take 0.5 mg by mouth daily as needed for sleep.   MAGNESIUM  GLYCINATE PLUS PO Take 2 capsules by mouth at bedtime.   mometasone  (NASONEX ) 50 MCG/ACT nasal spray Place 2 sprays into the nose in the morning and at bedtime.   montelukast  (SINGULAIR ) 10 MG tablet TAKE 1 TABLET(10 MG) BY MOUTH AT BEDTIME   Multiple Vitamin (MULTIVITAMIN) tablet Take 1 tablet by mouth daily.   olopatadine  (PATANOL) 0.1 % ophthalmic solution Place 1 drop into both eyes 2 (two) times daily.   Omega-3 Fatty Acids (OMEGAPURE 780 EC PO) Take 1 tablet by mouth daily.   No facility-administered medications prior to visit.    Review of Systems  Constitutional:  Negative for activity change, appetite change and unexpected weight change.  Respiratory: Negative.    Cardiovascular: Negative.   Gastrointestinal: Negative.   Endocrine: Negative for cold intolerance and heat intolerance.  Genitourinary: Negative.   Musculoskeletal: Negative.   Skin: Negative.   Neurological: Negative.   Hematological: Negative.   Psychiatric/Behavioral:  Negative for behavioral problems, decreased concentration, dysphoric mood, hallucinations, self-injury, sleep  disturbance and suicidal ideas. The patient is not nervous/anxious.         Objective:  BP 110/70 (BP Location: Left Arm, Patient Position: Sitting, Cuff Size: Large)   Pulse 79   Temp 98.3 F (36.8 C) (Oral)   Ht 5' 1 (1.549 m)   Wt 139 lb 9.6 oz (63.3 kg)   SpO2 98%   BMI 26.38 kg/m     Physical Exam Vitals and nursing note reviewed.  Constitutional:      General: She is not in acute distress. HENT:     Right Ear: Tympanic membrane, ear canal and external ear normal.     Left Ear: Tympanic membrane, ear canal and external ear normal.     Nose: Nose normal.  Eyes:     Extraocular Movements: Extraocular movements intact.     Conjunctiva/sclera: Conjunctivae normal.     Pupils: Pupils are equal, round, and reactive to light.  Neck:     Thyroid : No thyroid  mass, thyromegaly or thyroid  tenderness.  Cardiovascular:     Rate and Rhythm: Normal rate and regular rhythm.     Pulses: Normal pulses.     Heart sounds: Normal heart sounds.  Pulmonary:     Effort: Pulmonary effort is normal.     Breath sounds: Normal breath sounds.  Abdominal:     General: Bowel sounds are normal.     Palpations: Abdomen is soft.  Musculoskeletal:        General: Normal range of motion.     Cervical back: Normal range of motion and neck supple.     Right lower leg: No edema.     Left lower  leg: No edema.  Lymphadenopathy:     Cervical: No cervical adenopathy.  Skin:    General: Skin is warm and dry.  Neurological:     Mental Status: She is alert and oriented to person, place, and time.     Cranial Nerves: No cranial nerve deficit.  Psychiatric:        Mood and Affect: Mood normal.        Behavior: Behavior normal.        Thought Content: Thought content normal.     No results found for any visits on 07/01/24.    Assessment & Plan:    Routine Health Maintenance and Physical Exam  Immunization History  Administered Date(s) Administered   Influenza, Mdck, Trivalent,PF 6+ MOS(egg  free) 08/11/2023, 06/13/2024   Influenza,inj,Quad PF,6+ Mos 07/11/2012, 08/16/2013, 06/11/2014, 07/22/2015, 07/25/2016, 06/28/2019, 06/15/2020, 06/16/2021, 06/21/2022   Influenza-Unspecified 06/15/2020, 06/16/2021   Moderna Covid-19 Fall Seasonal Vaccine 73yrs & older 07/27/2022, 10/13/2023, 06/07/2024   PFIZER(Purple Top)SARS-COV-2 Vaccination 11/28/2019, 12/23/2019, 06/26/2020, 07/06/2020   Pfizer Covid-19 Vaccine Bivalent Booster 32yrs & up 08/05/2021   Tdap 03/20/2018   Zoster Recombinant(Shingrix) 06/16/2021, 09/16/2021    Health Maintenance  Topic Date Due   Pneumococcal Vaccine: 50+ Years (1 of 2 - PCV) Never done   COVID-19 Vaccine (9 - Pfizer risk 2025-26 season) 12/06/2024   Mammogram  01/28/2026   DTaP/Tdap/Td (2 - Td or Tdap) 03/20/2028   Colonoscopy  06/20/2033   Influenza Vaccine  Completed   Hepatitis C Screening  Completed   HIV Screening  Completed   Zoster Vaccines- Shingrix  Completed   Hepatitis B Vaccines 19-59 Average Risk  Aged Out   HPV VACCINES  Aged Out   Meningococcal B Vaccine  Aged Out   Discussed health benefits of physical activity, and encouraged her to engage in regular exercise appropriate for her age and condition.  Problem List Items Addressed This Visit   None Visit Diagnoses       Preventative health care    -  Primary   Relevant Orders   Comprehensive metabolic panel with GFR   TSH   CBC     Encounter for lipid screening for cardiovascular disease         Immunization due       Relevant Orders   Pneumococcal conjugate vaccine 20-valent (Prevnar 20)      Return in about 1 year (around 07/01/2025) for CPE (fasting).     Roselie Mood, NP

## 2024-07-01 NOTE — Patient Instructions (Addendum)
 Go to lab Maintain Heart healthy diet and daily exercise. Maintain current medications.

## 2024-09-08 ENCOUNTER — Other Ambulatory Visit: Payer: Self-pay | Admitting: Obstetrics and Gynecology

## 2024-09-08 DIAGNOSIS — N952 Postmenopausal atrophic vaginitis: Secondary | ICD-10-CM

## 2024-09-09 NOTE — Telephone Encounter (Signed)
 Med refill request: Estradiol  (Vagifem ) 10 mcg vaginal tablet Last AEX: 01/08/24 BS Next AEX: 01/22/25 BS Last MMG (if hormonal med)  01/29/24 Refill authorized: Please Advise? Last Rx sent #[id number] with 3 refills on 01/08/24 BS

## 2024-09-23 ENCOUNTER — Encounter: Payer: Self-pay | Admitting: Nurse Practitioner

## 2024-09-23 DIAGNOSIS — J310 Chronic rhinitis: Secondary | ICD-10-CM

## 2024-09-24 MED ORDER — MONTELUKAST SODIUM 10 MG PO TABS: 10.0000 mg | ORAL_TABLET | Freq: Every day | ORAL | 2 refills | Status: AC

## 2025-01-16 ENCOUNTER — Ambulatory Visit: Admitting: Obstetrics and Gynecology

## 2025-01-22 ENCOUNTER — Ambulatory Visit: Admitting: Obstetrics and Gynecology

## 2025-07-03 ENCOUNTER — Encounter: Admitting: Nurse Practitioner
